# Patient Record
Sex: Female | Born: 1950 | Race: White | Hispanic: No | Marital: Married | Smoking: Former smoker
Health system: Southern US, Community
[De-identification: ages and names within clinical notes are randomized; demographics above are authoritative.]

## PROBLEM LIST (undated history)

## (undated) DIAGNOSIS — K649 Unspecified hemorrhoids: Secondary | ICD-10-CM

## (undated) DIAGNOSIS — M069 Rheumatoid arthritis, unspecified: Secondary | ICD-10-CM

## (undated) DIAGNOSIS — J1282 Pneumonia due to coronavirus disease 2019: Secondary | ICD-10-CM

## (undated) DIAGNOSIS — L409 Psoriasis, unspecified: Secondary | ICD-10-CM

## (undated) DIAGNOSIS — U071 COVID-19: Secondary | ICD-10-CM

## (undated) HISTORY — DX: Pneumonia due to coronavirus disease 2019: J12.82

## (undated) HISTORY — PX: HEMORROIDECTOMY: SUR656

## (undated) HISTORY — PX: CATARACT EXTRACTION, BILATERAL: SHX1313

## (undated) HISTORY — DX: Psoriasis, unspecified: L40.9

## (undated) HISTORY — PX: BREAST EXCISIONAL BIOPSY: SUR124

## (undated) HISTORY — PX: JOINT REPLACEMENT: SHX530

## (undated) HISTORY — PX: ABDOMINAL HYSTERECTOMY: SHX81

## (undated) HISTORY — PX: COLONOSCOPY: SHX174

## (undated) HISTORY — PX: OOPHORECTOMY: SHX86

## (undated) HISTORY — PX: APPENDECTOMY: SHX54

## (undated) HISTORY — PX: EYE SURGERY: SHX253

## (undated) HISTORY — PX: BACK SURGERY: SHX140

## (undated) HISTORY — PX: BREAST BIOPSY: SHX20

## (undated) HISTORY — DX: Rheumatoid arthritis, unspecified: M06.9

## (undated) HISTORY — DX: COVID-19: U07.1

## (undated) HISTORY — PX: BUNIONECTOMY: SHX129

---

## 1998-10-23 ENCOUNTER — Encounter: Payer: Self-pay | Admitting: General Surgery

## 1998-10-23 ENCOUNTER — Ambulatory Visit (HOSPITAL_BASED_OUTPATIENT_CLINIC_OR_DEPARTMENT_OTHER): Admission: RE | Admit: 1998-10-23 | Discharge: 1998-10-23 | Payer: Self-pay | Admitting: General Surgery

## 2000-07-22 ENCOUNTER — Encounter: Payer: Self-pay | Admitting: Orthopedic Surgery

## 2000-07-22 ENCOUNTER — Ambulatory Visit (HOSPITAL_COMMUNITY): Admission: RE | Admit: 2000-07-22 | Discharge: 2000-07-22 | Payer: Self-pay | Admitting: Orthopedic Surgery

## 2001-04-15 ENCOUNTER — Ambulatory Visit (HOSPITAL_COMMUNITY): Admission: RE | Admit: 2001-04-15 | Discharge: 2001-04-15 | Payer: Self-pay | Admitting: Orthopedic Surgery

## 2004-05-24 ENCOUNTER — Ambulatory Visit: Payer: Self-pay | Admitting: Family Medicine

## 2004-06-06 ENCOUNTER — Inpatient Hospital Stay (HOSPITAL_COMMUNITY): Admission: RE | Admit: 2004-06-06 | Discharge: 2004-06-10 | Payer: Self-pay | Admitting: Orthopaedic Surgery

## 2004-08-24 ENCOUNTER — Ambulatory Visit: Payer: Self-pay | Admitting: Family Medicine

## 2004-09-06 ENCOUNTER — Ambulatory Visit (HOSPITAL_COMMUNITY): Admission: RE | Admit: 2004-09-06 | Discharge: 2004-09-07 | Payer: Self-pay | Admitting: Orthopaedic Surgery

## 2005-04-25 ENCOUNTER — Ambulatory Visit (HOSPITAL_COMMUNITY): Admission: RE | Admit: 2005-04-25 | Discharge: 2005-04-26 | Payer: Self-pay | Admitting: Orthopedic Surgery

## 2005-08-26 ENCOUNTER — Ambulatory Visit (HOSPITAL_COMMUNITY): Admission: RE | Admit: 2005-08-26 | Discharge: 2005-08-26 | Payer: Self-pay | Admitting: Neurology

## 2005-10-31 ENCOUNTER — Emergency Department: Payer: Self-pay | Admitting: Emergency Medicine

## 2005-11-04 ENCOUNTER — Ambulatory Visit: Payer: Self-pay | Admitting: Rheumatology

## 2005-12-12 ENCOUNTER — Ambulatory Visit: Payer: Self-pay | Admitting: Family Medicine

## 2006-08-04 ENCOUNTER — Ambulatory Visit: Payer: Self-pay | Admitting: Family Medicine

## 2006-12-17 ENCOUNTER — Ambulatory Visit: Payer: Self-pay | Admitting: Family Medicine

## 2006-12-17 DIAGNOSIS — M810 Age-related osteoporosis without current pathological fracture: Secondary | ICD-10-CM | POA: Insufficient documentation

## 2006-12-17 DIAGNOSIS — M8580 Other specified disorders of bone density and structure, unspecified site: Secondary | ICD-10-CM | POA: Insufficient documentation

## 2006-12-23 ENCOUNTER — Ambulatory Visit: Payer: Self-pay | Admitting: Family Medicine

## 2007-03-04 ENCOUNTER — Ambulatory Visit: Payer: Self-pay | Admitting: Rheumatology

## 2007-03-24 ENCOUNTER — Ambulatory Visit: Payer: Self-pay | Admitting: Orthopaedic Surgery

## 2007-03-30 ENCOUNTER — Ambulatory Visit: Payer: Self-pay | Admitting: Orthopaedic Surgery

## 2007-06-29 ENCOUNTER — Ambulatory Visit: Payer: Self-pay | Admitting: Family Medicine

## 2007-07-09 HISTORY — PX: REPLACEMENT TOTAL KNEE: SUR1224

## 2007-09-17 ENCOUNTER — Ambulatory Visit: Payer: Self-pay

## 2007-10-21 ENCOUNTER — Ambulatory Visit: Payer: Self-pay | Admitting: Orthopaedic Surgery

## 2007-10-27 ENCOUNTER — Ambulatory Visit: Payer: Self-pay | Admitting: Orthopaedic Surgery

## 2007-11-11 LAB — HM PAP SMEAR: HM Pap smear: NEGATIVE

## 2008-01-04 ENCOUNTER — Ambulatory Visit: Payer: Self-pay | Admitting: Family Medicine

## 2008-01-13 ENCOUNTER — Ambulatory Visit: Payer: Self-pay | Admitting: Unknown Physician Specialty

## 2008-01-19 ENCOUNTER — Inpatient Hospital Stay: Payer: Self-pay | Admitting: Unknown Physician Specialty

## 2008-07-08 HISTORY — PX: TOTAL SHOULDER REPLACEMENT: SUR1217

## 2008-09-05 ENCOUNTER — Ambulatory Visit: Payer: Self-pay | Admitting: Unknown Physician Specialty

## 2008-09-07 ENCOUNTER — Inpatient Hospital Stay: Payer: Self-pay | Admitting: Unknown Physician Specialty

## 2008-11-15 DIAGNOSIS — M9979 Connective tissue and disc stenosis of intervertebral foramina of abdomen and other regions: Secondary | ICD-10-CM | POA: Insufficient documentation

## 2008-11-15 DIAGNOSIS — D649 Anemia, unspecified: Secondary | ICD-10-CM | POA: Insufficient documentation

## 2008-11-18 DIAGNOSIS — Z90710 Acquired absence of both cervix and uterus: Secondary | ICD-10-CM | POA: Insufficient documentation

## 2009-01-04 ENCOUNTER — Ambulatory Visit: Payer: Self-pay | Admitting: Family Medicine

## 2010-01-17 ENCOUNTER — Ambulatory Visit: Payer: Self-pay | Admitting: Family Medicine

## 2010-02-08 DIAGNOSIS — I10 Essential (primary) hypertension: Secondary | ICD-10-CM | POA: Insufficient documentation

## 2011-01-22 ENCOUNTER — Ambulatory Visit: Payer: Self-pay | Admitting: Family Medicine

## 2011-11-20 ENCOUNTER — Ambulatory Visit: Payer: Self-pay | Admitting: Physical Medicine and Rehabilitation

## 2012-01-23 ENCOUNTER — Ambulatory Visit: Payer: Self-pay | Admitting: Family Medicine

## 2012-02-24 ENCOUNTER — Ambulatory Visit: Payer: Self-pay | Admitting: Family Medicine

## 2012-03-05 ENCOUNTER — Ambulatory Visit: Payer: Self-pay | Admitting: Gastroenterology

## 2012-03-05 LAB — HM COLONOSCOPY: HM Colonoscopy: NORMAL

## 2012-11-12 ENCOUNTER — Other Ambulatory Visit: Payer: Self-pay | Admitting: Neurological Surgery

## 2012-11-12 DIAGNOSIS — M542 Cervicalgia: Secondary | ICD-10-CM

## 2012-12-07 ENCOUNTER — Ambulatory Visit
Admission: RE | Admit: 2012-12-07 | Discharge: 2012-12-07 | Disposition: A | Discharge status: Home / Self Care | Payer: Medicare Other | Source: Ambulatory Visit | Attending: Neurological Surgery | Admitting: Neurological Surgery

## 2012-12-07 DIAGNOSIS — M542 Cervicalgia: Secondary | ICD-10-CM

## 2013-08-10 ENCOUNTER — Ambulatory Visit: Payer: Self-pay | Admitting: Anesthesiology

## 2013-08-10 LAB — POTASSIUM: Potassium: 3.6 mmol/L (ref 3.5–5.1)

## 2013-08-12 ENCOUNTER — Ambulatory Visit: Payer: Self-pay | Admitting: Orthopedic Surgery

## 2013-09-05 HISTORY — PX: CARPAL TUNNEL RELEASE: SHX101

## 2014-01-10 DIAGNOSIS — M5412 Radiculopathy, cervical region: Secondary | ICD-10-CM

## 2014-01-10 DIAGNOSIS — M5030 Other cervical disc degeneration, unspecified cervical region: Secondary | ICD-10-CM | POA: Insufficient documentation

## 2014-01-10 DIAGNOSIS — M4722 Other spondylosis with radiculopathy, cervical region: Secondary | ICD-10-CM | POA: Insufficient documentation

## 2014-01-10 DIAGNOSIS — M47812 Spondylosis without myelopathy or radiculopathy, cervical region: Secondary | ICD-10-CM

## 2014-01-10 DIAGNOSIS — G5600 Carpal tunnel syndrome, unspecified upper limb: Secondary | ICD-10-CM | POA: Insufficient documentation

## 2014-01-19 LAB — CBC AND DIFFERENTIAL
HCT: 40 % (ref 36–46)
Hemoglobin: 13.1 g/dL (ref 12.0–16.0)
Neutrophils Absolute: 3 /uL
Platelets: 315 10*3/uL (ref 150–399)
WBC: 4.7 10^3/mL

## 2014-01-19 LAB — LIPID PANEL
Cholesterol: 225 mg/dL — AB (ref 0–200)
HDL: 84 mg/dL — AB (ref 35–70)
LDL Cholesterol: 128 mg/dL
Triglycerides: 63 mg/dL (ref 40–160)

## 2014-01-19 LAB — BASIC METABOLIC PANEL
BUN: 19 mg/dL (ref 4–21)
Creatinine: 0.7 mg/dL (ref 0.5–1.1)
Glucose: 98 mg/dL
POTASSIUM: 4.9 mmol/L (ref 3.4–5.3)
Sodium: 145 mmol/L (ref 137–147)

## 2014-01-19 LAB — TSH: TSH: 2.59 u[IU]/mL (ref 0.41–5.90)

## 2014-01-19 LAB — HEPATIC FUNCTION PANEL
ALT: 17 U/L (ref 7–35)
AST: 17 U/L (ref 13–35)
Alkaline Phosphatase: 63 U/L (ref 25–125)
Bilirubin, Total: 0.6 mg/dL

## 2014-02-10 ENCOUNTER — Ambulatory Visit: Payer: Self-pay | Admitting: Family Medicine

## 2014-02-10 LAB — HM MAMMOGRAPHY

## 2014-02-28 LAB — HM DEXA SCAN

## 2014-10-29 NOTE — Op Note (Signed)
PATIENT NAME:  Meredith Russell, Meredith Russell MR#:  [id number] DATE OF BIRTH:  1950/09/24  DATE OF PROCEDURE:  08/12/2013  PREOPERATIVE DIAGNOSES: 1.  Rheumatoid arthritis. 2.  Bilateral carpal tunnel syndrome.   POSTOPERATIVE DIAGNOSIS: 1.  Rheumatoid arthritis. 2.  Bilateral carpal tunnel syndrome.  PROCEDURE:  Right carpal tunnel release and flexor tenosynovectomy on the left, carpal tunnel release.   ANESTHESIA: General.   SURGEON: Hessie Knows, MD.   DESCRIPTION OF PROCEDURE: The patient was brought to the operating room and after adequate anesthesia was obtained, both arms were prepped and draped in the usual sterile fashion with a tourniquet applied to the upper arm. Appropriate patient identification and timeout procedures were completed. The right arm was approached first with raising of tourniquet. The palm was approached with an incision in line with the ring metacarpal approximately an inch long. The skin and subcutaneous tissue were spread and the transverse carpal ligament identified. This was opened and a hemostat was placed beneath this to protect the underlying structures. Release was carried out initially distally and then proximally in the proximal canal. There did appear to be significant compression on the median nerve. After release, there was vascular blush to the nerve. There was also very extensive flexor tenosynovitis of the flexor digitorum sublimis with minimal involvement of the profundus tendons. The flexor tenosynovium was then debrided with a large amount of flexor tenosynovium removed. The sublimis tendons appeared to have significant wear to them with fibrillation along the outer edges consistent with chronic inflammation. The wound was then irrigated and then infiltrated with a total of 10 mL of 0.5% Sensorcaine plain and closed with simple interrupted 4-0 nylon skin sutures. Xeroform, 4 x 4's, Webril and sterile Ace were applied and the tourniquet let down. Attention was then  turned to the left arm with a repeat timeout procedure completed, tourniquet raised and identical incision made. Skin and subcutaneous tissue was opened and transverse carpal ligament incised. Hemostat was placed and release was carried out proximally and distally. There again, was compression in the mid to proximal canal and after release there was good vascular blush to the nerve. There was really not significant flexor tenosynovitis present on the left wrist.  Because of this, the wound was irrigated and then closed without any debridement required. The wound was then infiltrated with 10 mL of 0.5% Sensorcaine and then closed with simple interrupted nylon skin sutures. Xeroform, 4 x 4's, Webril and Ace wrap applied. Tourniquet time on the left was 9 minutes, on the right 12 minutes. There were no complications and no specimens.   ____________________________ Laurene Footman, MD mjm:ce D: 08/12/2013 18:27:37 ET T: 08/12/2013 20:19:38 ET JOB#: [id number]  cc: Laurene Footman, MD, <Dictator> Laurene Footman MD ELECTRONICALLY SIGNED 08/13/2013 8:21

## 2014-11-22 DIAGNOSIS — E039 Hypothyroidism, unspecified: Secondary | ICD-10-CM | POA: Insufficient documentation

## 2014-11-22 DIAGNOSIS — G4700 Insomnia, unspecified: Secondary | ICD-10-CM | POA: Insufficient documentation

## 2014-11-22 DIAGNOSIS — F4320 Adjustment disorder, unspecified: Secondary | ICD-10-CM | POA: Insufficient documentation

## 2014-11-22 DIAGNOSIS — E038 Other specified hypothyroidism: Secondary | ICD-10-CM | POA: Insufficient documentation

## 2015-01-23 ENCOUNTER — Encounter: Payer: Self-pay | Admitting: Family Medicine

## 2015-01-23 ENCOUNTER — Ambulatory Visit (INDEPENDENT_AMBULATORY_CARE_PROVIDER_SITE_OTHER): Payer: Medicare Other | Admitting: Family Medicine

## 2015-01-23 VITALS — BP 124/80 | HR 60 | Temp 97.9°F | Resp 16 | Ht 64.0 in | Wt 138.0 lb

## 2015-01-23 DIAGNOSIS — M8580 Other specified disorders of bone density and structure, unspecified site: Secondary | ICD-10-CM | POA: Diagnosis not present

## 2015-01-23 DIAGNOSIS — G43009 Migraine without aura, not intractable, without status migrainosus: Secondary | ICD-10-CM | POA: Diagnosis not present

## 2015-01-23 DIAGNOSIS — G4700 Insomnia, unspecified: Secondary | ICD-10-CM

## 2015-01-23 DIAGNOSIS — G5600 Carpal tunnel syndrome, unspecified upper limb: Secondary | ICD-10-CM | POA: Diagnosis not present

## 2015-01-23 DIAGNOSIS — E039 Hypothyroidism, unspecified: Secondary | ICD-10-CM

## 2015-01-23 DIAGNOSIS — M5412 Radiculopathy, cervical region: Secondary | ICD-10-CM | POA: Diagnosis not present

## 2015-01-23 DIAGNOSIS — Z0000 Encounter for general adult medical examination without abnormal findings: Secondary | ICD-10-CM

## 2015-01-23 DIAGNOSIS — M47812 Spondylosis without myelopathy or radiculopathy, cervical region: Secondary | ICD-10-CM

## 2015-01-23 DIAGNOSIS — Z23 Encounter for immunization: Secondary | ICD-10-CM | POA: Diagnosis not present

## 2015-01-23 DIAGNOSIS — K649 Unspecified hemorrhoids: Secondary | ICD-10-CM

## 2015-01-23 DIAGNOSIS — I10 Essential (primary) hypertension: Secondary | ICD-10-CM

## 2015-01-23 DIAGNOSIS — M9979 Connective tissue and disc stenosis of intervertebral foramina of abdomen and other regions: Secondary | ICD-10-CM

## 2015-01-23 DIAGNOSIS — Z1239 Encounter for other screening for malignant neoplasm of breast: Secondary | ICD-10-CM

## 2015-01-23 DIAGNOSIS — T7840XA Allergy, unspecified, initial encounter: Secondary | ICD-10-CM | POA: Insufficient documentation

## 2015-01-23 DIAGNOSIS — M069 Rheumatoid arthritis, unspecified: Secondary | ICD-10-CM | POA: Diagnosis not present

## 2015-01-23 DIAGNOSIS — E038 Other specified hypothyroidism: Secondary | ICD-10-CM | POA: Diagnosis not present

## 2015-01-23 NOTE — Progress Notes (Signed)
Patient: Meredith Russell, Female    DOB: 05/15/1951, 64 y.o.   MRN: [id number] Visit Date: 01/23/2015  Today's Provider: Margarita Rana, MD   Chief Complaint  Patient presents with  . Medicare Wellness   Subjective:    Annual wellness visit Meredith Russell is a 64 y.o. female who presents today for her Subsequent Annual Wellness Visit. She feels well. She reports exercising daily, walks a mile, stationary bike, swims. She reports she is sleeping fairly well. Controlled on Ambien.  Last CPE- 01/19/2014 Last Pap- S/P Hysterectomy Last Mammo- 02/10/2014 BI-RADS 1 Last colon- 03/05/2012. Dr. Candace Cruise. WNL. Repeat 10 years Last BMD- 02/08/2014- Osteopenia. No change from 2010.  -----------------------------------------------------------  Joint/Muscle Pain:  Pt has degenerative arthritis. Patient complains of arthralgias and myalgias for which has been present for several years. Pain is located in multiple joints, is described as aching, and is constant .  Associated symptoms include: crepitation and edema.  The patient has multiple prescription medications, prescribed by Dr. Precious Reel, as well as Dr. Sharlet Salina.  Related to injury:   No.  Hemorrhoids: Pt would like provider to examine this problem, to see if pt needs a referral for surgery.    Review of Systems  Constitutional: Positive for chills and diaphoresis.  HENT: Positive for rhinorrhea and sinus pressure.   Eyes: Positive for itching.  Endocrine: Positive for polyuria (nocturia).  Genitourinary: Positive for frequency, genital sores (hemorrhoid) and vaginal pain.  Musculoskeletal: Positive for myalgias, back pain, arthralgias and neck pain.  Neurological: Positive for headaches.  All other systems reviewed and are negative.   History   Social History  . Marital Status: Married    Spouse Name: N/A  . Number of Children: N/A  . Years of Education: N/A   Occupational History  . Not on file.   Social History Main  Topics  . Smoking status: Former Smoker    Quit date: 11/22/1987  . Smokeless tobacco: Never Used  . Alcohol Use: Yes     Comment: occasional; drinks vodka and whiskey  . Drug Use: No  . Sexual Activity: Not on file   Other Topics Concern  . Not on file   Social History Narrative    Patient Active Problem List   Diagnosis Date Noted  . Allergic state 01/23/2015  . Adaptation reaction 11/22/2014  . Cannot sleep 11/22/2014  . Subclinical hypothyroidism 11/22/2014  . Cervical radiculitis 01/10/2014  . Cervical osteoarthritis 01/10/2014  . Carpal tunnel syndrome 01/10/2014  . BP (high blood pressure) 02/08/2010  . Chronic infection of sinus 06/26/2009  . Allergic rhinitis 11/21/2008  . H/O total hysterectomy 11/18/2008  . History of tobacco use 11/18/2008  . Absolute anemia 11/15/2008  . Narrowing of intervertebral disc space 11/15/2008  . Migraine without aura and responsive to treatment 11/15/2008  . Arthritis, degenerative 11/15/2008  . Arthritis or polyarthritis, rheumatoid 11/15/2008  . Osteopenia 12/17/2006    Past Surgical History  Procedure Laterality Date  . Joint replacement    . Back surgery    . Carpal tunnel release Bilateral 09/2013  . Abdominal hysterectomy    . Eye surgery    . Bunionectomy    . Hemorroidectomy      Her family history includes Cancer in her father; Diabetes in her other; Heart attack in her mother; Heart disease in her father, mother, and other; Hyperlipidemia in her other; Hypertension in her other.    Previous Medications   AMLODIPINE BESYLATE PO  Take by mouth.   ASPIRIN 81 MG TABLET    LOW-DOSE ASPIRIN, 81MG  (Oral Tablet)  1 Every Day for 0 days  Quantity: 0.00;  Refills: 0   Ordered :20-Sep-2010  Ashley Royalty ;  Started 21-Nov-2008 Active Comments: DX: 627.2   ETODOLAC (LODINE) 400 MG TABLET    Take by mouth.   FLUTICASONE (FLONASE) 50 MCG/ACT NASAL SPRAY    Place into the nose.   LEFLUNOMIDE (ARAVA) 20 MG TABLET    Take by  mouth.   MECLIZINE (ANTIVERT) 12.5 MG TABLET    Take by mouth.   MONTELUKAST (SINGULAIR) 10 MG TABLET    Take by mouth.   MULTIPLE VITAMIN PO    MULTIVITAMINS (Oral Tablet)  1 Every Day for 0 days  Quantity: 0.00;  Refills: 0   Ordered :20-Sep-2010  Ashley Royalty ;  Started 15-Sep-2009 Active   OMEGA-3 FATTY ACIDS PO    OMEGA-3 FATTY ACIDS, 1200MG  (Oral Capsule)  for 0 days  Quantity: 0.00;  Refills: 0   Ordered :20-Sep-2010  Ashley Royalty ;  Started 21-Nov-2008 Active Comments: DX: 714.0   ONDANSETRON (ZOFRAN) 4 MG TABLET    Take by mouth.   OXYCODONE-ACETAMINOPHEN (PERCOCET) 7.5-325 MG PER TABLET    Take by mouth.   PROPRANOLOL-HCTZ PO    Take by mouth.   SUVOREXANT 10 MG TABS    Take by mouth.   TIZANIDINE HCL PO    Take by mouth.   VITAMIN C (ASCORBIC ACID) 500 MG TABLET    VITAMIN C, 500MG  (Oral Tablet)  1 Every Day for 0 days  Quantity: 0.00;  Refills: 0   Ordered :28-Jul-2014  Abran Richard ;  Started 15-Nov-2008 Active Comments: DX: 627.2   ZOLPIDEM (AMBIEN) 10 MG TABLET    Take by mouth.    Patient Care Team: Margarita Rana, MD as PCP - General (Family Medicine)     Objective:   Vitals: BP 124/80 mmHg  Pulse 60  Temp(Src) 97.9 F (36.6 C) (Oral)  Resp 16  Ht 5\' 4"  (1.626 m)  Wt 138 lb (62.596 kg)  BMI 23.68 kg/m2  Physical Exam  Constitutional: She is oriented to person, place, and time. She appears well-developed and well-nourished. No distress.  HENT:  Head: Normocephalic and atraumatic.  Right Ear: External ear normal.  Left Ear: External ear normal.  Nose: Nose normal.  Mouth/Throat: Oropharynx is clear and moist. No oropharyngeal exudate.  Eyes: Conjunctivae and EOM are normal. Pupils are equal, round, and reactive to light. Right eye exhibits no discharge. Left eye exhibits no discharge.  Neck: Normal range of motion. Neck supple. No tracheal deviation present. No thyromegaly present.  Cardiovascular: Normal rate, regular rhythm, normal heart sounds and intact distal  pulses.  Exam reveals no gallop and no friction rub.   No murmur heard. Pulmonary/Chest: Effort normal and breath sounds normal. No respiratory distress. She has no wheezes. She has no rales. She exhibits no tenderness.  Abdominal: Soft. Bowel sounds are normal. She exhibits no distension and no mass. There is no tenderness. There is no rebound and no guarding.  Genitourinary: No breast swelling, tenderness, discharge or bleeding.  Musculoskeletal: Normal range of motion. She exhibits no edema or tenderness.  Lymphadenopathy:    She has no cervical adenopathy.  Neurological: She is alert and oriented to person, place, and time. She has normal reflexes. She displays normal reflexes. No cranial nerve deficit. She exhibits normal muscle tone. Coordination normal.  Skin: Skin is warm and dry. No  rash noted. She is not diaphoretic. No erythema. No pallor.  Psychiatric: She has a normal mood and affect. Her behavior is normal. Judgment and thought content normal.    Activities of Daily Living In your present state of health, do you have any difficulty performing the following activities: 01/23/2015  Hearing? N  Vision? N  Difficulty concentrating or making decisions? N  Walking or climbing stairs? N  Dressing or bathing? N  Doing errands, shopping? N    Fall Risk Assessment Fall Risk  01/23/2015  Falls in the past year? No     Depression Screen PHQ 2/9 Scores 01/23/2015  PHQ - 2 Score 0    Cognitive Testing - 6-CIT  Correct? Score   What year is it? yes 0 0 or 4  What month is it? yes 0 0 or 3  Memorize:    Pia Mau,  40,  [address]  [place],      What time is it? (within 1 hour) yes 0 0 or 3  Count backwards from 20 yes 0 0, 2, or 4  Name the months of the year yes 0 0, 2, or 4  Repeat name & address above yes 0 0, 2, 4, 6, 8, or 10       TOTAL SCORE  0/28   Interpretation:  Normal  Normal (0-7) Abnormal (8-28)       Assessment & Plan:     Annual Wellness  Visit  Reviewed patient's Family Medical History Reviewed and updated list of patient's medical providers Assessment of cognitive impairment was done Assessed patient's functional ability Established a written schedule for health screening [place] Completed and Reviewed  Exercise Activities and Dietary recommendations Goals    None     PREVNAR ADMINISTERED AT TODAY"S OV.  Immunization History  Administered Date(s) Administered  . Pneumococcal Conjugate-13 01/23/2015  . Tdap 10/07/2006  . Zoster 07/06/2012    Health Maintenance  Topic Date Due  . HIV Screening  04/10/1964  . PNA vac Low Risk Adult (1 of 2 - PCV13) 04/10/2014  . INFLUENZA VACCINE  02/06/2015  . MAMMOGRAM  02/11/2016  . TETANUS/TDAP  10/06/2016  . COLONOSCOPY  03/05/2022  . DEXA SCAN  Completed  . ZOSTAVAX  Completed      Discussed health benefits of physical activity, and encouraged her to engage in regular exercise appropriate for her age and condition.    ------------------------------------------------------------------------------------------------------------   1. Hemorrhoids, unspecified hemorrhoid type New problem. Will refer to general surgeon.  - Ambulatory refer to surgery.   2. Essential hypertension Stable. Continue current medications and plan of care.  3. Migraine without aura and responsive to treatment F/B physiatry. Also for her chronic pain.   4. Subclinical hypothyroidism Stable. Last labs stable.   5. Carpal tunnel syndrome, unspecified laterality Stable.  6. Cervical radiculitis F/B specialist. 7. Arthritis or polyarthritis, rheumatoid F/B rheumatology.   8. Cervical osteoarthritis Stable. Will recheck BMD in 2-3 years.  9. Narrowing of intervertebral disc space F/B specialist.  10. Osteopenia Stable. Will recheck BMD in 2-3 years.  11. Cannot sleep Stable. Will refill Ambien as needed.  Understands risks of medication. Does perform good  sleep hygiene. Has tried to taper and other medication without difficulty.    12. Breast cancer screening Pt aware to call to schedule mammogram. - MM DIGITAL SCREENING BILATERAL; Future   Patient seen and examined by Jerrell Belfast, MD, and note scribed by Renaldo Fiddler, CMA. I have  reviewed the document for accuracy and completeness and I agree with above. Jerrell Belfast, MD    Margarita Rana, MD

## 2015-02-03 NOTE — Addendum Note (Signed)
Addended by: Silvio Clayman on: 02/03/2015 05:01 PM   Modules accepted: Orders

## 2015-02-07 ENCOUNTER — Other Ambulatory Visit: Payer: Self-pay | Admitting: Family Medicine

## 2015-02-07 DIAGNOSIS — I10 Essential (primary) hypertension: Secondary | ICD-10-CM

## 2015-02-07 DIAGNOSIS — J309 Allergic rhinitis, unspecified: Secondary | ICD-10-CM

## 2015-02-13 ENCOUNTER — Other Ambulatory Visit: Payer: Self-pay | Admitting: Family Medicine

## 2015-02-13 ENCOUNTER — Ambulatory Visit
Admission: RE | Admit: 2015-02-13 | Discharge: 2015-02-13 | Disposition: A | Discharge status: Home / Self Care | Payer: Medicare Other | Source: Ambulatory Visit | Attending: Family Medicine | Admitting: Family Medicine

## 2015-02-13 DIAGNOSIS — Z1239 Encounter for other screening for malignant neoplasm of breast: Secondary | ICD-10-CM

## 2015-02-13 DIAGNOSIS — Z1231 Encounter for screening mammogram for malignant neoplasm of breast: Secondary | ICD-10-CM | POA: Insufficient documentation

## 2015-06-22 ENCOUNTER — Other Ambulatory Visit: Payer: Self-pay | Admitting: Physical Medicine and Rehabilitation

## 2015-06-22 DIAGNOSIS — M5412 Radiculopathy, cervical region: Secondary | ICD-10-CM

## 2015-07-12 ENCOUNTER — Ambulatory Visit
Admission: RE | Admit: 2015-07-12 | Discharge: 2015-07-12 | Disposition: A | Discharge status: Home / Self Care | Payer: Medicare Other | Source: Ambulatory Visit | Attending: Physical Medicine and Rehabilitation | Admitting: Physical Medicine and Rehabilitation

## 2015-07-12 DIAGNOSIS — M5412 Radiculopathy, cervical region: Secondary | ICD-10-CM

## 2015-07-12 DIAGNOSIS — R200 Anesthesia of skin: Secondary | ICD-10-CM | POA: Insufficient documentation

## 2015-07-12 DIAGNOSIS — M4802 Spinal stenosis, cervical region: Secondary | ICD-10-CM | POA: Insufficient documentation

## 2015-07-12 DIAGNOSIS — R51 Headache: Secondary | ICD-10-CM | POA: Insufficient documentation

## 2016-01-24 ENCOUNTER — Ambulatory Visit (INDEPENDENT_AMBULATORY_CARE_PROVIDER_SITE_OTHER): Payer: Medicare Other | Admitting: Family Medicine

## 2016-01-24 ENCOUNTER — Encounter: Payer: Medicare Other | Admitting: Family Medicine

## 2016-01-24 DIAGNOSIS — Z0000 Encounter for general adult medical examination without abnormal findings: Secondary | ICD-10-CM

## 2016-01-24 NOTE — Progress Notes (Signed)
   PATIENT NOT SEEN DUE TO EVACUATION OF [facility]   Review of Systems  Constitutional: Negative for fever, chills and fatigue.  HENT: Positive for ear pain and sinus pressure. Negative for congestion, rhinorrhea, sneezing and sore throat.   Eyes: Positive for photophobia and itching. Negative for pain and redness.  Respiratory: Negative for cough, shortness of breath and wheezing.   Cardiovascular: Negative for chest pain and leg swelling.  Gastrointestinal: Positive for constipation. Negative for nausea, abdominal pain, diarrhea and blood in stool.  Endocrine: Positive for cold intolerance. Negative for polydipsia and polyphagia.  Genitourinary: Negative.  Negative for dysuria, hematuria, flank pain, vaginal bleeding, vaginal discharge and pelvic pain.  Musculoskeletal: Positive for myalgias, back pain, arthralgias and neck pain. Negative for joint swelling and gait problem.  Skin: Negative for rash.  Neurological: Positive for dizziness. Negative for tremors, seizures, weakness, light-headedness, numbness and headaches.  Hematological: Negative for adenopathy.  Psychiatric/Behavioral: Negative.  Negative for behavioral problems, confusion and dysphoric mood. The patient is not nervous/anxious and is not hyperactive.       Physical Exam

## 2016-02-14 ENCOUNTER — Other Ambulatory Visit: Payer: Self-pay | Admitting: Family Medicine

## 2016-02-14 DIAGNOSIS — Z1231 Encounter for screening mammogram for malignant neoplasm of breast: Secondary | ICD-10-CM

## 2016-02-15 ENCOUNTER — Encounter: Payer: Self-pay | Admitting: Family Medicine

## 2016-02-21 ENCOUNTER — Encounter: Payer: Self-pay | Admitting: Family Medicine

## 2016-02-22 ENCOUNTER — Ambulatory Visit (INDEPENDENT_AMBULATORY_CARE_PROVIDER_SITE_OTHER): Payer: Medicare Other | Admitting: Family Medicine

## 2016-02-22 ENCOUNTER — Encounter: Payer: Self-pay | Admitting: Family Medicine

## 2016-02-22 VITALS — BP 148/84 | HR 72 | Temp 97.9°F | Resp 16 | Ht 63.0 in | Wt 137.0 lb

## 2016-02-22 DIAGNOSIS — R42 Dizziness and giddiness: Secondary | ICD-10-CM

## 2016-02-22 DIAGNOSIS — Z0000 Encounter for general adult medical examination without abnormal findings: Secondary | ICD-10-CM

## 2016-02-22 DIAGNOSIS — E039 Hypothyroidism, unspecified: Secondary | ICD-10-CM

## 2016-02-22 DIAGNOSIS — H9312 Tinnitus, left ear: Secondary | ICD-10-CM

## 2016-02-22 DIAGNOSIS — Z1231 Encounter for screening mammogram for malignant neoplasm of breast: Secondary | ICD-10-CM

## 2016-02-22 DIAGNOSIS — E038 Other specified hypothyroidism: Secondary | ICD-10-CM | POA: Diagnosis not present

## 2016-02-22 DIAGNOSIS — Z1239 Encounter for other screening for malignant neoplasm of breast: Secondary | ICD-10-CM

## 2016-02-22 DIAGNOSIS — Z23 Encounter for immunization: Secondary | ICD-10-CM

## 2016-02-22 NOTE — Progress Notes (Signed)
Patient: Meredith Russell, Female    DOB: 1951/05/21, 65 y.o.   MRN: [id number] Visit Date: 02/22/2016  Today's Provider: Lelon Huh, MD   Chief Complaint  Patient presents with  . Annual Exam   Subjective:    Annual physical Meredith Russell is a 65 y.o. female. She feels well. She reports exercising daily. She reports she is sleeping poorly. 01/23/15 AWE 02/13/15 Mammogram-BI-RADS 1 02/28/14 BMD 03/05/12 Colonoscopy-normal -----------------------------------------------------------   Hypertension, follow-up:  BP Readings from Last 3 Encounters:  02/22/16 (!) 148/84  01/23/15 124/80  07/28/14 118/74    She was last seen for hypertension 1 years ago.  BP at that visit was 124/80. Management changes since that visit include no changes. She reports excellent compliance with treatment. She is not having side effects. She is exercising. She is adherent to low salt diet.   Outside blood pressures are stable. She is experiencing none.  Patient denies chest pain.   Cardiovascular risk factors include hypertension and smoking/ tobacco exposure.  Use of agents associated with hypertension: decongestants.     Weight trend: stable Wt Readings from Last 3 Encounters:  02/22/16 137 lb (62.1 kg)  01/23/15 138 lb (62.6 kg)  07/28/14 140 lb (63.5 kg)    Current diet: in general, a "healthy" diet    ------------------------------------------------------------------------  Hypothyroid, follow-up:  TSH  Date Value Ref Range Status  01/19/2014 2.59 0.41 - 5.90 uIU/mL Final   Wt Readings from Last 3 Encounters:  02/22/16 137 lb (62.1 kg)  01/23/15 138 lb (62.6 kg)  07/28/14 140 lb (63.5 kg)    She was last seen for hypothyroid 1 years ago.  Management since that visit includes no changes. She reports excellent compliance with treatment. She is having side effects.  She is exercising. She is experiencing heat / cold intolerance She denies none Weight trend:  stable  ------------------------------------------------------------------------   Follow up for Insomnia  The patient was last seen for this 1 years ago. Changes made at last visit include no changes.  She reports excellent compliance with treatment. She feels that condition is Unchanged. She is not having side effects.  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   She is states she has been hearing 'cricket' noises in her left ear for a few months, associated with some light headedness. She occasionally take Claritin D for allergies.   Review of Systems  Constitutional: Negative.   HENT: Positive for ear pain, sinus pressure and tinnitus (left more than right).   Eyes: Positive for photophobia and itching.  Respiratory: Negative.   Cardiovascular: Negative.   Gastrointestinal: Positive for constipation.  Endocrine: Positive for cold intolerance.  Genitourinary: Negative.   Musculoskeletal: Positive for arthralgias, back pain, myalgias and neck pain.  Skin: Negative.   Allergic/Immunologic: Negative.   Neurological: Positive for dizziness.  Hematological: Negative.   Psychiatric/Behavioral: Negative.     Social History   Social History  . Marital status: Married    Spouse name: N/A  . Number of children: N/A  . Years of education: N/A   Occupational History  . Not on file.   Social History Main Topics  . Smoking status: Former Smoker    Quit date: 11/22/1987  . Smokeless tobacco: Never Used  . Alcohol use Yes     Comment: occasional; drinks vodka and whiskey  . Drug use: No  . Sexual activity: Not on file   Other Topics Concern  . Not  on file   Social History Narrative  . No narrative on file    History reviewed. No pertinent past medical history.   Patient Active Problem List   Diagnosis Date Noted  . Allergic state 01/23/2015  . Adaptation  reaction 11/22/2014  . Cannot sleep 11/22/2014  . Subclinical hypothyroidism 11/22/2014  . Cervical radiculitis 01/10/2014  . Cervical osteoarthritis 01/10/2014  . Carpal tunnel syndrome 01/10/2014  . DDD (degenerative disc disease), cervical 01/10/2014  . BP (high blood pressure) 02/08/2010  . Chronic infection of sinus 06/26/2009  . Allergic rhinitis 11/21/2008  . H/O total hysterectomy 11/18/2008  . History of tobacco use 11/18/2008  . Absolute anemia 11/15/2008  . Narrowing of intervertebral disc space 11/15/2008  . Migraine without aura and responsive to treatment 11/15/2008  . Arthritis, degenerative 11/15/2008  . Arthritis or polyarthritis, rheumatoid ([place]) 11/15/2008  . Osteopenia 12/17/2006    Past Surgical History:  Procedure Laterality Date  . ABDOMINAL HYSTERECTOMY    . BACK SURGERY    . BREAST BIOPSY Right    neg  . BUNIONECTOMY    . CARPAL TUNNEL RELEASE Bilateral 09/2013  . EYE SURGERY    . HEMORROIDECTOMY    . JOINT REPLACEMENT      Her family history includes Cancer in her father; Diabetes in her other; Heart attack in her mother; Heart disease in her father, mother, and other; Hyperlipidemia in her other; Hypertension in her other.    Current Meds  Medication Sig  . amLODipine (NORVASC) 2.5 MG tablet Take 1 tablet by mouth daily.  Marland Kitchen aspirin 81 MG tablet LOW-DOSE ASPIRIN, 81MG  (Oral Tablet)  1 Every Day for 0 days  Quantity: 0.00;  Refills: 0   Ordered :20-Sep-2010  Ashley Royalty ;  Started 21-Nov-2008 Active Comments: DX: 627.2  . conjugated estrogens (PREMARIN) vaginal cream Place vaginally. Place 0.5 g vaginally once daily. Daily for two weeks, every other day for two weeks, then twice a week thereafter.  . diclofenac (VOLTAREN) 50 MG EC tablet Take 1 tablet by mouth 2 (two) times daily.  . fluticasone (FLONASE) 50 MCG/ACT nasal spray Place into the nose.  Marland Kitchen HUMIRA PEN 40 MG/0.8ML PNKT Inject 0.8 mLs as directed every 14 (fourteen) days.  Marland Kitchen leflunomide  (ARAVA) 20 MG tablet Take 20 mg by mouth daily.   . Loratadine-Pseudoephedrine (CLARITIN-D 24 HOUR PO) Take 1 tablet by mouth daily as needed.  . meclizine (ANTIVERT) 12.5 MG tablet Take 12.5 mg by mouth as needed.   . OMEGA-3 FATTY ACIDS PO OMEGA-3 FATTY ACIDS, 1200MG  (Oral Capsule)  for 0 days  Quantity: 0.00;  Refills: 0   Ordered :20-Sep-2010  Ashley Royalty ;  Started 21-Nov-2008 Active Comments: DX: 714.0  . ondansetron (ZOFRAN) 4 MG tablet Take 4 mg by mouth every 8 (eight) hours as needed.   Marland Kitchen oxyCODONE-acetaminophen (PERCOCET) 7.5-325 MG per tablet Take 1 tablet by mouth every 8 (eight) hours as needed.   Marland Kitchen tiZANidine (ZANAFLEX) 2 MG tablet Take 2 mg by mouth every 6 (six) hours as needed for muscle spasms.  . vitamin C (ASCORBIC ACID) 500 MG tablet VITAMIN C, 500MG  (Oral Tablet)  1 Every Day for 0 days  Quantity: 0.00;  Refills: 0   Ordered :28-Jul-2014  Abran Richard ;  Started 15-Nov-2008 Active Comments: DX: 627.2  . zolpidem (AMBIEN) 10 MG tablet Take 10 mg by mouth at bedtime.   . [DISCONTINUED] etodolac (LODINE) 400 MG tablet Take 1 tablet by mouth 2 (two) times daily.  . [  DISCONTINUED] montelukast (SINGULAIR) 10 MG tablet Take 1 tablet by mouth  every day  . [DISCONTINUED] MULTIPLE VITAMIN PO MULTIVITAMINS (Oral Tablet)  1 Every Day for 0 days  Quantity: 0.00;  Refills: 0   Ordered :20-Sep-2010  Ashley Royalty ;  Started 15-Sep-2009 Active  . [DISCONTINUED] PROPRANOLOL-HCTZ PO Take by mouth.  . [DISCONTINUED] propranolol-hydrochlorothiazide (INDERIDE) 80-25 MG per tablet Take 1 tablet by mouth  daily  . [DISCONTINUED] Suvorexant 10 MG TABS Take by mouth.  . [DISCONTINUED] TIZANIDINE HCL PO Take by mouth.  . [DISCONTINUED] tolterodine (DETROL LA) 2 MG 24 hr capsule Take 1 capsule by mouth daily.    Patient Care Team: Birdie Sons, MD as PCP - General (Family Medicine) Julieanne Manson Leeanne Mannan., MD (Rheumatology)    Objective:   Vitals: BP (!) 148/84 (BP Location: Right Arm,  Patient Position: Sitting, Cuff Size: Normal)   Pulse 72   Temp 97.9 F (36.6 C) (Oral)   Resp 16   Ht 5\' 3"  (1.6 m)   Wt 137 lb (62.1 kg)   SpO2 98%   BMI 24.27 kg/m   Physical Exam  Constitutional: She is oriented to person, place, and time. She appears well-developed and well-nourished.  HENT:  Head: Normocephalic and atraumatic.  Right Ear: External ear normal.  Left Ear: External ear normal.  Nose: Nose normal.  Mouth/Throat: Oropharynx is clear and moist.  Eyes: Conjunctivae and EOM are normal.  Neck: Normal range of motion. Neck supple.  Cardiovascular: Normal rate, regular rhythm and normal heart sounds.   Pulmonary/Chest: Effort normal and breath sounds normal.  Abdominal: Soft. Bowel sounds are normal.  Musculoskeletal: Normal range of motion.  Neurological: She is alert and oriented to person, place, and time.  Skin: Skin is warm and dry.  Psychiatric: She has a normal mood and affect. Her behavior is normal. Judgment and thought content normal.    Activities of Daily Living In your present state of health, do you have any difficulty performing the following activities: 02/22/2016 01/24/2016  Hearing? N N  Vision? N N  Difficulty concentrating or making decisions? N N  Walking or climbing stairs? N N  Dressing or bathing? N N  Doing errands, shopping? N N  Some recent data might be hidden    Fall Risk Assessment Fall Risk  02/22/2016 01/24/2016 01/23/2015  Falls in the past year? No No No     Depression Screen PHQ 2/9 Scores 02/22/2016 01/24/2016 01/23/2015  PHQ - 2 Score 0 0 0    Cognitive Testing - 6-CIT  Correct? Score   What year is it? yes 0 0 or 4  What month is it? yes 0 0 or 3  Memorize:    Pia Mau,  40,  [place],      What time is it? (within 1 hour) yes 0 0 or 3  Count backwards from 20 yes 0 0, 2, or 4  Name the months of the year yes 0 0, 2, or 4  Repeat name & address above yes 0 0, 2, 4, 6, 8, or 10       TOTAL SCORE  0/28    Interpretation:  Normal  Normal (0-7) Abnormal (8-28)       Assessment & Plan:     Annual physical  Reviewed patient's Family Medical History Reviewed and updated list of patient's medical providers Assessment of cognitive impairment was done Assessed patient's functional ability Established a written schedule for health screening services  Health Risk Assessent Completed and Reviewed  Exercise Activities and Dietary recommendations Goals    None      Immunization History  Administered Date(s) Administered  . Pneumococcal Conjugate-13 01/23/2015  . Pneumococcal Polysaccharide-23 02/22/2016  . Tdap 10/07/2006  . Zoster 07/06/2012    Health Maintenance  Topic Date Due  . PNA vac Low Risk Adult (2 of 2 - PPSV23) 01/23/2016  . INFLUENZA VACCINE  02/06/2016  . TETANUS/TDAP  10/06/2016  . MAMMOGRAM  02/12/2017  . COLONOSCOPY  03/05/2022  . DEXA SCAN  Completed  . ZOSTAVAX  Completed  . Hepatitis C Screening  Completed      Discussed health benefits of physical activity, and encouraged her to engage in regular exercise appropriate for her age and condition.    ------------------------------------------------------------------------------------------------------------  1. Annual physical exam  - EKG 12-Lead  2. Breast cancer screening Patient to call [facility] to schedule mammogram - MM DIGITAL SCREENING BILATERAL  3. Need for pneumococcal vaccination  - Pneumococcal polysaccharide vaccine 23-valent greater than or equal to 2yo subcutaneous/IM  4. Subclinical hypothyroidism  - Lipid Panel With LDL/HDL Ratio - TSH  5. Dizziness  - EKG 12-Lead - CBC with Differential/Platelet - Comprehensive metabolic panel - Ambulatory referral to Audiology  6. Tinnitus, left  - Ambulatory referral to Audiology   Lelon Huh, MD  [facility]

## 2016-02-23 ENCOUNTER — Telehealth: Payer: Self-pay

## 2016-02-23 LAB — CBC WITH DIFFERENTIAL/PLATELET
BASOS ABS: 0 10*3/uL (ref 0.0–0.2)
Basos: 1 %
EOS (ABSOLUTE): 0.4 10*3/uL (ref 0.0–0.4)
Eos: 7 %
Hematocrit: 38.4 % (ref 34.0–46.6)
Hemoglobin: 12.8 g/dL (ref 11.1–15.9)
Immature Grans (Abs): 0 10*3/uL (ref 0.0–0.1)
Immature Granulocytes: 0 %
LYMPHS ABS: 1.1 10*3/uL (ref 0.7–3.1)
Lymphs: 23 %
MCH: 31.4 pg (ref 26.6–33.0)
MCHC: 33.3 g/dL (ref 31.5–35.7)
MCV: 94 fL (ref 79–97)
MONOS ABS: 0.5 10*3/uL (ref 0.1–0.9)
Monocytes: 11 %
NEUTROS ABS: 2.8 10*3/uL (ref 1.4–7.0)
Neutrophils: 58 %
PLATELETS: 356 10*3/uL (ref 150–379)
RBC: 4.07 x10E6/uL (ref 3.77–5.28)
RDW: 12.9 % (ref 12.3–15.4)
WBC: 4.8 10*3/uL (ref 3.4–10.8)

## 2016-02-23 LAB — COMPREHENSIVE METABOLIC PANEL
ALBUMIN: 4.4 g/dL (ref 3.6–4.8)
ALT: 24 IU/L (ref 0–32)
AST: 22 IU/L (ref 0–40)
Albumin/Globulin Ratio: 1.9 (ref 1.2–2.2)
Alkaline Phosphatase: 79 IU/L (ref 39–117)
BUN / CREAT RATIO: 22 (ref 12–28)
BUN: 19 mg/dL (ref 8–27)
Bilirubin Total: 0.4 mg/dL (ref 0.0–1.2)
CHLORIDE: 101 mmol/L (ref 96–106)
CO2: 25 mmol/L (ref 18–29)
Calcium: 9.7 mg/dL (ref 8.7–10.3)
Creatinine, Ser: 0.86 mg/dL (ref 0.57–1.00)
GFR calc Af Amer: 81 mL/min/{1.73_m2} (ref >59)
GFR calc non Af Amer: 71 mL/min/{1.73_m2} (ref >59)
GLUCOSE: 96 mg/dL (ref 65–99)
Globulin, Total: 2.3 g/dL (ref 1.5–4.5)
Potassium: 5.1 mmol/L (ref 3.5–5.2)
Sodium: 141 mmol/L (ref 134–144)
Total Protein: 6.7 g/dL (ref 6.0–8.5)

## 2016-02-23 LAB — LIPID PANEL WITH LDL/HDL RATIO
CHOLESTEROL TOTAL: 194 mg/dL (ref 100–199)
HDL: 80 mg/dL (ref >39)
LDL Calculated: 96 mg/dL (ref 0–99)
LDl/HDL Ratio: 1.2 ratio units (ref 0.0–3.2)
Triglycerides: 91 mg/dL (ref 0–149)
VLDL CHOLESTEROL CAL: 18 mg/dL (ref 5–40)

## 2016-02-23 LAB — TSH: TSH: 1.27 u[IU]/mL (ref 0.450–4.500)

## 2016-02-23 NOTE — Telephone Encounter (Signed)
Pt advised.   Thanks,   -Laura  

## 2016-02-23 NOTE — Telephone Encounter (Signed)
-----   Message from Birdie Sons, MD sent at 02/23/2016  8:03 AM EDT ----- thyroid, blood sugar, kidney and liver functions, electrolytes and cholesterol are all normal. Check labs yearly.

## 2016-02-27 ENCOUNTER — Telehealth: Payer: Self-pay | Admitting: Family Medicine

## 2016-02-27 NOTE — Telephone Encounter (Signed)
Pt is requesting a copy of last lab results.  CB#[phone]/MW

## 2016-02-27 NOTE — Telephone Encounter (Signed)
Left message on vm letting pt know lab results. Are ready to pick-up.

## 2016-03-08 ENCOUNTER — Other Ambulatory Visit: Payer: Self-pay | Admitting: Otolaryngology

## 2016-03-08 DIAGNOSIS — H905 Unspecified sensorineural hearing loss: Secondary | ICD-10-CM

## 2016-03-08 DIAGNOSIS — H9319 Tinnitus, unspecified ear: Secondary | ICD-10-CM

## 2016-03-08 DIAGNOSIS — R42 Dizziness and giddiness: Secondary | ICD-10-CM

## 2016-03-15 ENCOUNTER — Ambulatory Visit
Admission: RE | Admit: 2016-03-15 | Discharge: 2016-03-15 | Disposition: A | Discharge status: Home / Self Care | Payer: Medicare Other | Source: Ambulatory Visit | Attending: Otolaryngology | Admitting: Otolaryngology

## 2016-03-15 ENCOUNTER — Other Ambulatory Visit: Payer: Self-pay

## 2016-03-15 DIAGNOSIS — H905 Unspecified sensorineural hearing loss: Secondary | ICD-10-CM | POA: Insufficient documentation

## 2016-03-15 DIAGNOSIS — R490 Dysphonia: Secondary | ICD-10-CM | POA: Diagnosis present

## 2016-03-15 DIAGNOSIS — H9319 Tinnitus, unspecified ear: Secondary | ICD-10-CM | POA: Diagnosis present

## 2016-03-15 DIAGNOSIS — R42 Dizziness and giddiness: Secondary | ICD-10-CM

## 2016-03-15 MED ORDER — ZOLPIDEM TARTRATE 10 MG PO TABS: 10.0000 mg | ORAL_TABLET | Freq: Every day | ORAL | 5 refills | Status: DC

## 2016-03-15 MED ORDER — GADOBENATE DIMEGLUMINE 529 MG/ML IV SOLN
12.0000 mL | Freq: Once | INTRAVENOUS | Status: AC | PRN
  Administered 2016-03-15: 12 mL via INTRAVENOUS

## 2016-03-15 NOTE — Telephone Encounter (Signed)
Rx faxed to pharmacy  

## 2016-03-15 NOTE — Telephone Encounter (Signed)
Please call in zolpidem  

## 2016-04-04 ENCOUNTER — Other Ambulatory Visit: Payer: Self-pay | Admitting: Family Medicine

## 2016-04-04 ENCOUNTER — Ambulatory Visit
Admission: RE | Admit: 2016-04-04 | Discharge: 2016-04-04 | Disposition: A | Discharge status: Home / Self Care | Payer: Medicare Other | Source: Ambulatory Visit | Attending: Family Medicine | Admitting: Family Medicine

## 2016-04-04 DIAGNOSIS — Z1231 Encounter for screening mammogram for malignant neoplasm of breast: Secondary | ICD-10-CM | POA: Diagnosis not present

## 2016-05-10 ENCOUNTER — Encounter: Payer: Self-pay | Admitting: Family Medicine

## 2016-05-10 DIAGNOSIS — I679 Cerebrovascular disease, unspecified: Secondary | ICD-10-CM | POA: Insufficient documentation

## 2016-10-07 IMAGING — MG 2D DIGITAL SCREENING BILATERAL MAMMOGRAM WITH CAD AND ADJUNCT TO
9 of 13 series · 9 of 29 positions shown · non-contrast
Comparison: Previous exam(s).

CLINICAL DATA: Screening. History of benign right breast biopsy.

EXAM:
2D DIGITAL SCREENING BILATERAL MAMMOGRAM WITH CAD AND ADJUNCT TOMO

[L CC (1 of 2)]
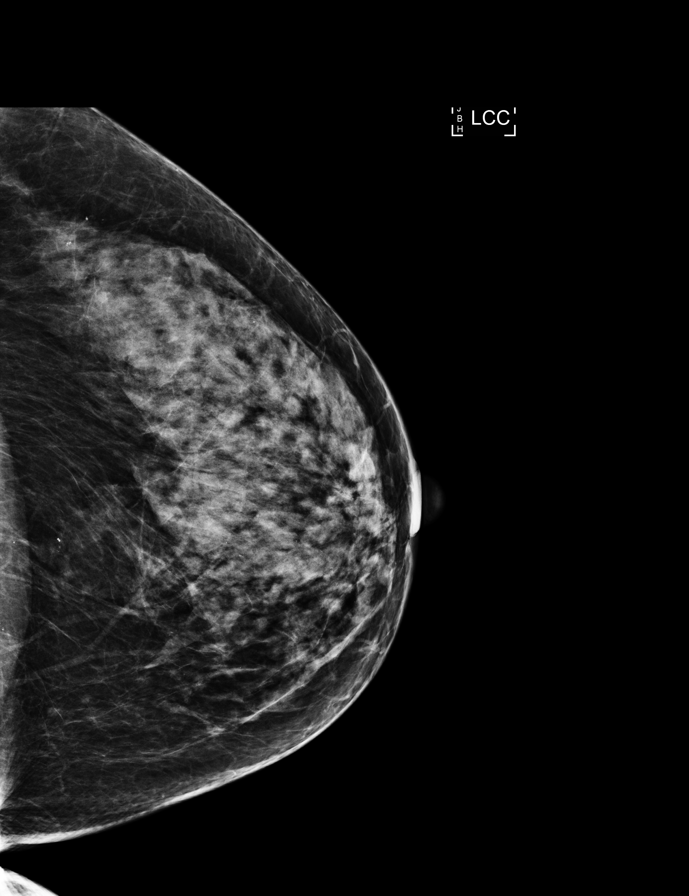

[R MLO synth-2D]
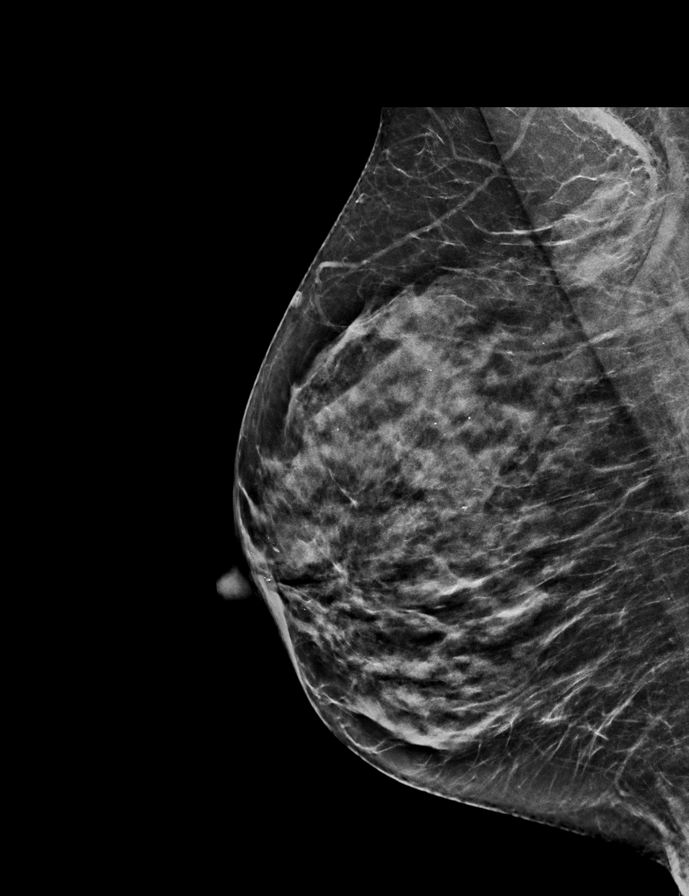

[R MLO]
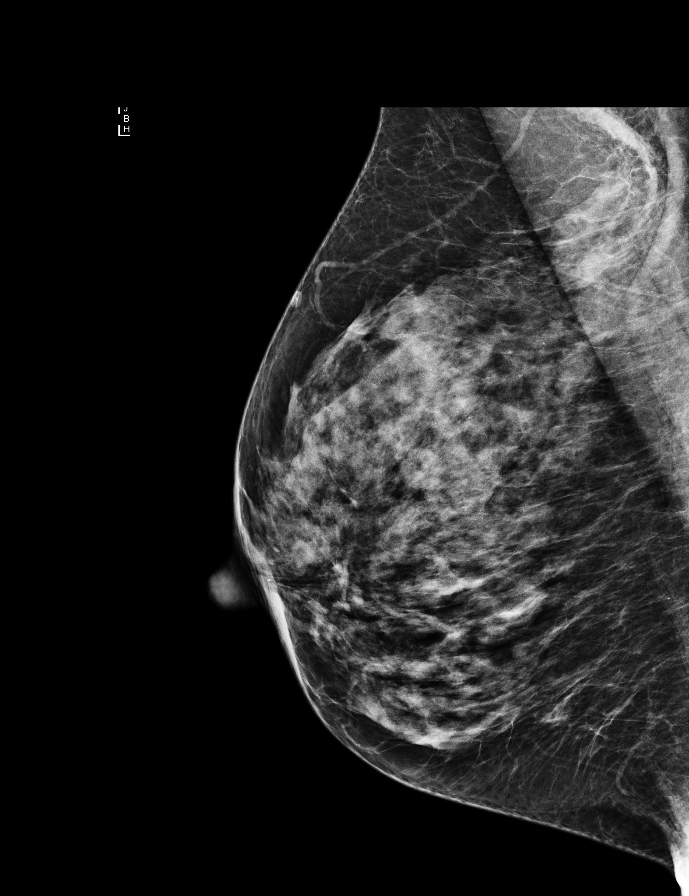

[L CC (2 of 2)]
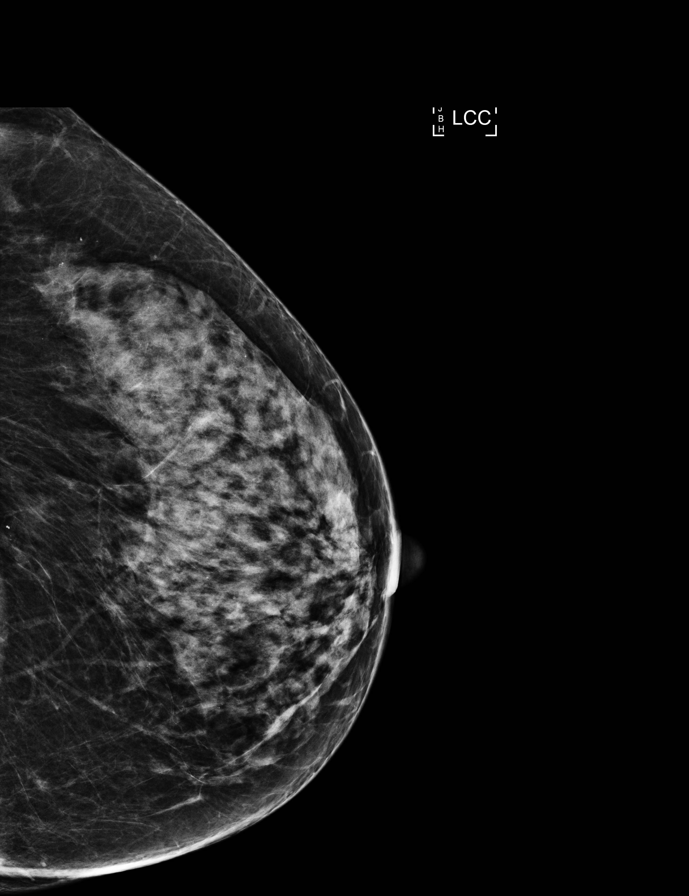

[R CC]
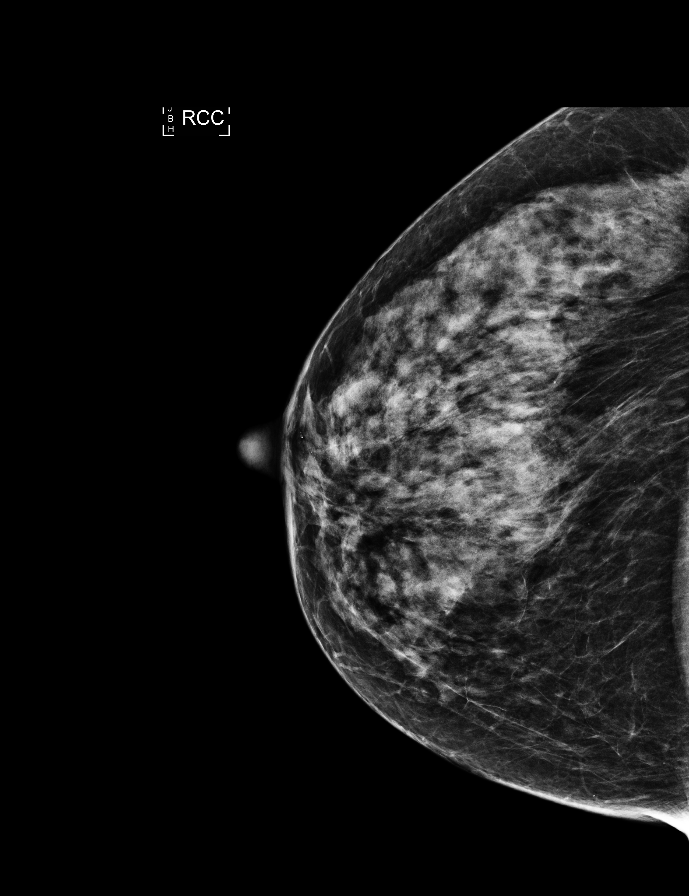

[L MLO]
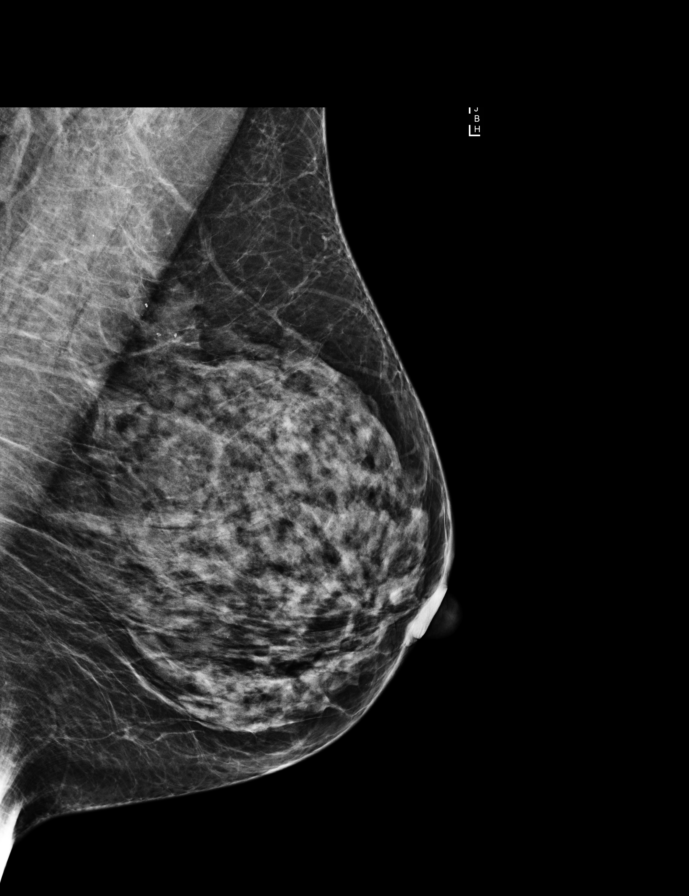

[R CC synth-2D]
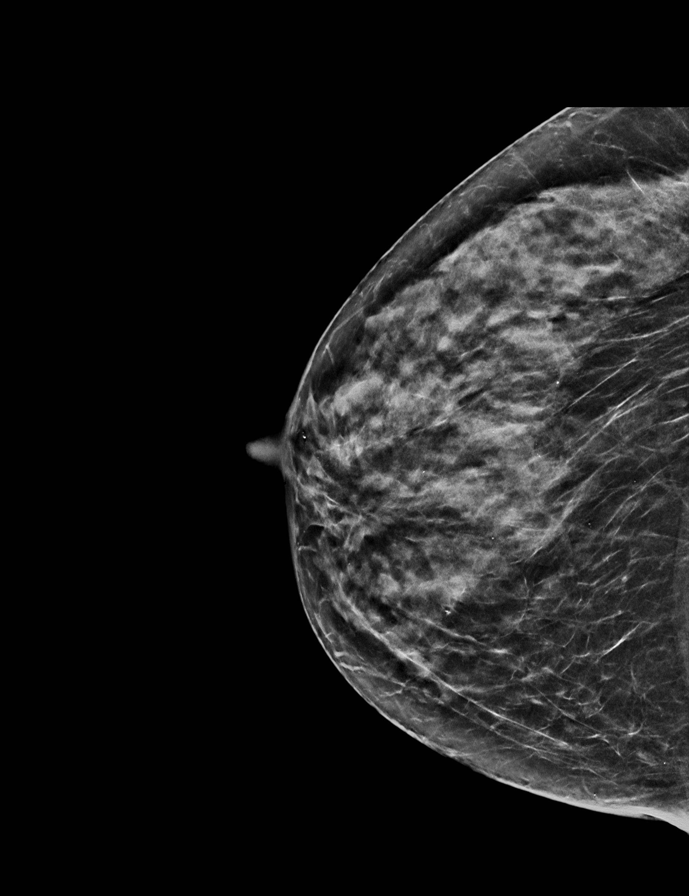

[L CC synth-2D]
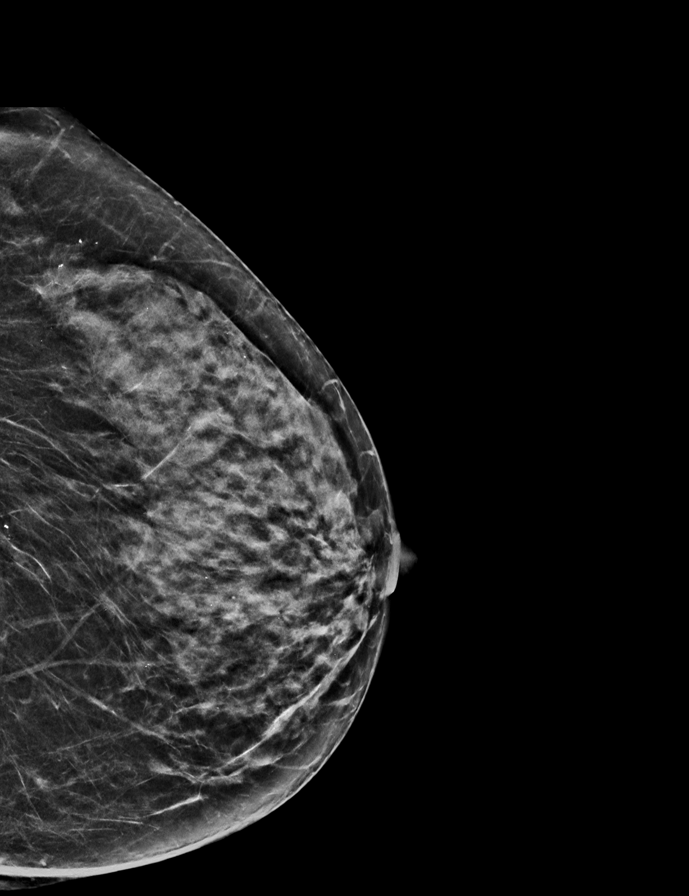

[L MLO synth-2D]
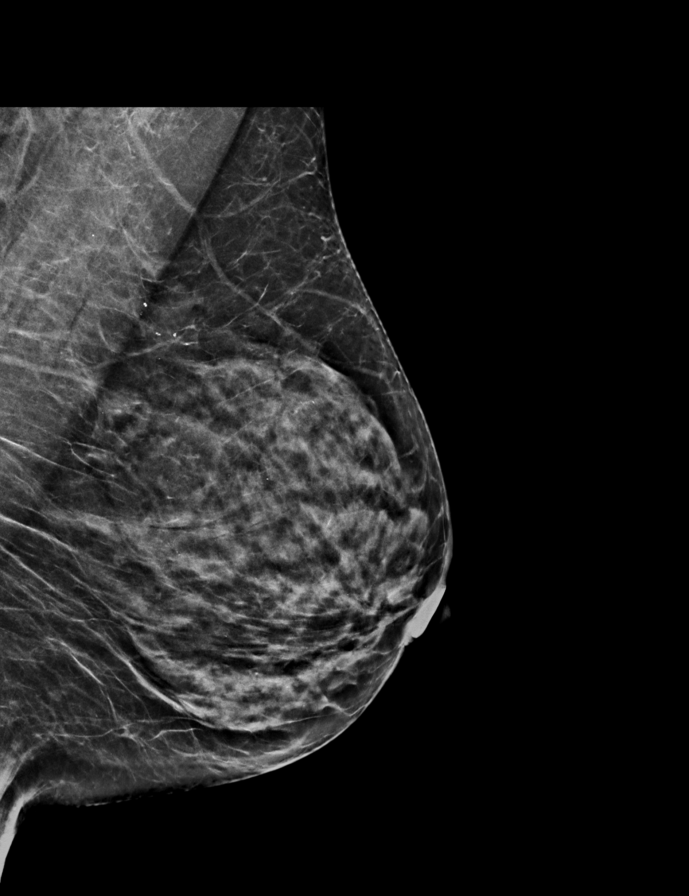

[9 of 29 positions shown; findings below may reference images not displayed]

ACR Breast Density Category c: The breast tissue is heterogeneously
dense, which may obscure small masses.
FINDINGS: There are stable postsurgical changes within the right breast. There
are no findings suspicious for malignancy within either breast.
Images were processed with CAD.
IMPRESSION: No mammographic evidence of malignancy. A result letter of this
screening mammogram will be mailed directly to the patient.

RECOMMENDATION:
Screening mammogram in one year. (Code:GD-N-M2C)

BI-RADS CATEGORY  2: Benign.

## 2016-12-30 ENCOUNTER — Ambulatory Visit (INDEPENDENT_AMBULATORY_CARE_PROVIDER_SITE_OTHER): Payer: Medicare Other | Admitting: Physician Assistant

## 2016-12-30 ENCOUNTER — Encounter: Payer: Self-pay | Admitting: Physician Assistant

## 2016-12-30 VITALS — BP 130/72 | HR 79 | Temp 97.9°F | Resp 16 | Wt 139.6 lb

## 2016-12-30 DIAGNOSIS — R300 Dysuria: Secondary | ICD-10-CM | POA: Diagnosis not present

## 2016-12-30 LAB — POCT URINALYSIS DIPSTICK
Bilirubin, UA: NEGATIVE
Blood, UA: NEGATIVE
Glucose, UA: NEGATIVE
Ketones, UA: NEGATIVE
LEUKOCYTES UA: NEGATIVE
NITRITE UA: NEGATIVE
PH UA: 6 (ref 5.0–8.0)
PROTEIN UA: NEGATIVE
Spec Grav, UA: 1.02 (ref 1.010–1.025)
Urobilinogen, UA: 0.2 E.U./dL

## 2016-12-30 MED ORDER — NITROFURANTOIN MONOHYD MACRO 100 MG PO CAPS: 100.0000 mg | ORAL_CAPSULE | Freq: Two times a day (BID) | ORAL | 0 refills | Status: DC

## 2016-12-30 NOTE — Patient Instructions (Signed)

## 2016-12-30 NOTE — Progress Notes (Signed)
Patient: Meredith Russell Female    DOB: 04/05/1951   66 y.o.   MRN: [id number] Visit Date: 12/30/2016  Today's Provider: Mar Daring, PA-C   Chief Complaint  Patient presents with  . Urinary Tract Infection   Subjective:    Urinary Tract Infection   This is a recurrent (She went Saturday 06/16 to the walk-in [facility] for pressure and cramping. Dx with UTI and took ciprofloxacin 500MG .) problem. The current episode started in the past 7 days. The problem occurs intermittently. The problem has been unchanged. The quality of the pain is described as aching and burning. There has been no fever. Associated symptoms include frequency and urgency (feeling like incomplete emptying). Pertinent negatives include no discharge, flank pain or hematuria. Associated symptoms comments: Still having pressure on lower abdomen.. She has tried antibiotics (AZO and Cipro) for the symptoms. The treatment provided mild relief. There is no history of kidney stones or recurrent UTIs.  She does report symptoms were improving slightly with cipro but did not completely resolve. The urinalysis from the walk in clinic did show few bacteria and WBC. The urine culture had no growth however.     Allergies  Allergen Reactions  . Lyrica  [Pregabalin]   . Sulfa Antibiotics   . Savella  [Milnacipran] Rash     Current Outpatient Prescriptions:  .  amLODipine (NORVASC) 2.5 MG tablet, Take 1 tablet by mouth daily., Disp: , Rfl:  .  aspirin 81 MG tablet, LOW-DOSE ASPIRIN, 81MG  (Oral Tablet)  1 Every Day for 0 days  Quantity: 0.00;  Refills: 0   Ordered :20-Sep-2010  Ashley Royalty ;  Started 21-Nov-2008 Active Comments: DX: 627.2, Disp: , Rfl:  .  diclofenac (VOLTAREN) 50 MG EC tablet, Take 1 tablet by mouth 2 (two) times daily., Disp: , Rfl:  .  diclofenac sodium (VOLTAREN) 1 % GEL, Apply topically., Disp: , Rfl:  .  fluticasone (FLONASE) 50 MCG/ACT nasal spray, Place into the nose., Disp: , Rfl:  .   HUMIRA PEN 40 MG/0.8ML PNKT, Inject 0.8 mLs as directed every 14 (fourteen) days., Disp: , Rfl:  .  leflunomide (ARAVA) 20 MG tablet, Take 20 mg by mouth daily. , Disp: , Rfl:  .  Loratadine-Pseudoephedrine (CLARITIN-D 24 HOUR PO), Take 1 tablet by mouth daily as needed., Disp: , Rfl:  .  meclizine (ANTIVERT) 12.5 MG tablet, Take 12.5 mg by mouth as needed. , Disp: , Rfl:  .  OMEGA-3 FATTY ACIDS PO, OMEGA-3 FATTY ACIDS, 1200MG  (Oral Capsule)  for 0 days  Quantity: 0.00;  Refills: 0   Ordered :20-Sep-2010  Ashley Royalty ;  Started 21-Nov-2008 Active Comments: DX: 714.0, Disp: , Rfl:  .  oxyCODONE-acetaminophen (PERCOCET) 7.5-325 MG per tablet, Take 1 tablet by mouth every 8 (eight) hours as needed. , Disp: , Rfl:  .  tiZANidine (ZANAFLEX) 2 MG tablet, Take 2 mg by mouth every 6 (six) hours as needed for muscle spasms., Disp: , Rfl:  .  vitamin C (ASCORBIC ACID) 500 MG tablet, VITAMIN C, 500MG  (Oral Tablet)  1 Every Day for 0 days  Quantity: 0.00;  Refills: 0   Ordered :28-Jul-2014  Abran Richard ;  Started 15-Nov-2008 Active Comments: DX: 627.2, Disp: , Rfl:  .  zolpidem (AMBIEN) 10 MG tablet, Take 1 tablet (10 mg total) by mouth at bedtime., Disp: 30 tablet, Rfl: 5 .  ondansetron (ZOFRAN) 4 MG tablet, Take 4 mg by mouth every 8 (eight) hours as  needed. , Disp: , Rfl:   Review of Systems  Constitutional: Negative.   Respiratory: Negative.   Cardiovascular: Negative.   Gastrointestinal: Negative.   Genitourinary: Positive for dysuria, frequency and urgency (feeling like incomplete emptying). Negative for flank pain, hematuria, vaginal bleeding, vaginal discharge and vaginal pain.  Musculoskeletal: Negative.     Social History  Substance Use Topics  . Smoking status: Former Smoker    Quit date: 11/22/1987  . Smokeless tobacco: Never Used  . Alcohol use Yes     Comment: occasional; drinks vodka and whiskey   Objective:   BP 130/72 (BP Location: Right Arm, Patient Position: Sitting, Cuff Size:  Normal)   Pulse 79   Temp 97.9 F (36.6 C) (Oral)   Resp 16   Wt 139 lb 9.6 oz (63.3 kg)   BMI 24.73 kg/m     Physical Exam  Constitutional: She is oriented to person, place, and time. She appears well-developed and well-nourished. No distress.  Cardiovascular: Normal rate, regular rhythm and normal heart sounds.  Exam reveals no gallop and no friction rub.   No murmur heard. Pulmonary/Chest: Effort normal and breath sounds normal. No respiratory distress. She has no wheezes. She has no rales.  Abdominal: Soft. Normal appearance and bowel sounds are normal. She exhibits no distension and no mass. There is no hepatosplenomegaly. There is tenderness in the suprapubic area. There is no rebound, no guarding and no CVA tenderness.  Suprapubic pressure   Neurological: She is alert and oriented to person, place, and time.  Skin: Skin is warm and dry. She is not diaphoretic.       Assessment & Plan:     1. Dysuria UA today was unremarkable. Will send for culture as below. Will treat with macrobid as below. If there is no improvement in symptoms and urine culture remains negative patient is to return for a pelvic exam to rule out cystocele vs vaginal atrophy as causes for her symptoms.  - POCT urinalysis dipstick - nitrofurantoin, macrocrystal-monohydrate, (MACROBID) 100 MG capsule; Take 1 capsule (100 mg total) by mouth 2 (two) times daily.  Dispense: 14 capsule; Refill: 0 - Urine Culture       Mar Daring, PA-C  [facility]

## 2017-01-01 LAB — URINE CULTURE

## 2017-01-01 NOTE — Progress Notes (Signed)
Advised  ED 

## 2017-01-03 ENCOUNTER — Ambulatory Visit (INDEPENDENT_AMBULATORY_CARE_PROVIDER_SITE_OTHER): Payer: Medicare Other | Admitting: Physician Assistant

## 2017-01-03 ENCOUNTER — Telehealth: Payer: Self-pay | Admitting: Family Medicine

## 2017-01-03 ENCOUNTER — Encounter: Payer: Self-pay | Admitting: Physician Assistant

## 2017-01-03 VITALS — BP 140/80 | HR 100 | Temp 97.9°F | Resp 16 | Wt 136.4 lb

## 2017-01-03 DIAGNOSIS — R252 Cramp and spasm: Secondary | ICD-10-CM

## 2017-01-03 DIAGNOSIS — K5909 Other constipation: Secondary | ICD-10-CM | POA: Diagnosis not present

## 2017-01-03 DIAGNOSIS — R300 Dysuria: Secondary | ICD-10-CM | POA: Diagnosis not present

## 2017-01-03 DIAGNOSIS — Z87891 Personal history of nicotine dependence: Secondary | ICD-10-CM

## 2017-01-03 DIAGNOSIS — Z90710 Acquired absence of both cervix and uterus: Secondary | ICD-10-CM

## 2017-01-03 NOTE — Telephone Encounter (Signed)
Pt decided to come in.  [place]

## 2017-01-03 NOTE — Progress Notes (Signed)
Patient: Meredith Russell Female    DOB: 11-Jan-1951   66 y.o.   MRN: [id number] Visit Date: 01/03/2017  Today's Provider: Mar Daring, PA-C   Chief Complaint  Patient presents with  . Follow-up    Urinary symptoms   Subjective:    HPI Patient here today with c/o still having urinary symptoms. Patient was seen for UTI's symptoms on 06/25 and urine culture was sent to the lab and showed no bacteria. Provider recommended that if symptoms improving with antibiotic it is ok to continue, if no improvement please discontinue and f/u for a pelvic exam. Patient still taking the antibiotic and c/o lower abdominal/cramping pain and cramps on her legs.    Allergies  Allergen Reactions  . Lyrica  [Pregabalin]   . Sulfa Antibiotics   . Savella  [Milnacipran] Rash     Current Outpatient Prescriptions:  .  amLODipine (NORVASC) 2.5 MG tablet, Take 1 tablet by mouth daily., Disp: , Rfl:  .  aspirin 81 MG tablet, LOW-DOSE ASPIRIN, 81MG  (Oral Tablet)  1 Every Day for 0 days  Quantity: 0.00;  Refills: 0   Ordered :20-Sep-2010  Ashley Royalty ;  Started 21-Nov-2008 Active Comments: DX: 627.2, Disp: , Rfl:  .  diclofenac sodium (VOLTAREN) 1 % GEL, Apply topically., Disp: , Rfl:  .  fluticasone (FLONASE) 50 MCG/ACT nasal spray, Place into the nose., Disp: , Rfl:  .  HUMIRA PEN 40 MG/0.8ML PNKT, Inject 0.8 mLs as directed every 14 (fourteen) days., Disp: , Rfl:  .  leflunomide (ARAVA) 20 MG tablet, Take 20 mg by mouth daily. , Disp: , Rfl:  .  Loratadine-Pseudoephedrine (CLARITIN-D 24 HOUR PO), Take 1 tablet by mouth daily as needed., Disp: , Rfl:  .  meclizine (ANTIVERT) 12.5 MG tablet, Take 12.5 mg by mouth as needed. , Disp: , Rfl:  .  nitrofurantoin, macrocrystal-monohydrate, (MACROBID) 100 MG capsule, Take 1 capsule (100 mg total) by mouth 2 (two) times daily., Disp: 14 capsule, Rfl: 0 .  OMEGA-3 FATTY ACIDS PO, OMEGA-3 FATTY ACIDS, 1200MG  (Oral Capsule)  for 0 days  Quantity: 0.00;   Refills: 0   Ordered :20-Sep-2010  Ashley Royalty ;  Started 21-Nov-2008 Active Comments: DX: 714.0, Disp: , Rfl:  .  ondansetron (ZOFRAN) 4 MG tablet, Take 4 mg by mouth every 8 (eight) hours as needed. , Disp: , Rfl:  .  oxyCODONE-acetaminophen (PERCOCET) 7.5-325 MG per tablet, Take 1 tablet by mouth every 8 (eight) hours as needed. , Disp: , Rfl:  .  tiZANidine (ZANAFLEX) 2 MG tablet, Take 2 mg by mouth every 6 (six) hours as needed for muscle spasms., Disp: , Rfl:  .  vitamin C (ASCORBIC ACID) 500 MG tablet, VITAMIN C, 500MG  (Oral Tablet)  1 Every Day for 0 days  Quantity: 0.00;  Refills: 0   Ordered :28-Jul-2014  Abran Richard ;  Started 15-Nov-2008 Active Comments: DX: 627.2, Disp: , Rfl:  .  zolpidem (AMBIEN) 10 MG tablet, Take 1 tablet (10 mg total) by mouth at bedtime., Disp: 30 tablet, Rfl: 5  Review of Systems  Constitutional: Negative.   Respiratory: Negative for cough, chest tightness and shortness of breath.   Cardiovascular: Negative for chest pain, palpitations and leg swelling.  Gastrointestinal: Positive for abdominal pain (pressure and pain).  Genitourinary: Positive for dyspareunia (occasionally), dysuria, frequency, pelvic pain and urgency (when she gets up at night to go and 1st thing in the morning). Negative for decreased urine volume, flank  pain, hematuria, menstrual problem and vaginal discharge.  Musculoskeletal: Positive for arthralgias and myalgias.       Leg cramping    Social History  Substance Use Topics  . Smoking status: Former Smoker    Quit date: 11/22/1987  . Smokeless tobacco: Never Used  . Alcohol use Yes     Comment: occasional; drinks vodka and whiskey   Objective:   BP 140/80 (BP Location: Left Arm, Patient Position: Sitting, Cuff Size: Normal)   Pulse 100   Temp 97.9 F (36.6 C) (Oral)   Resp 16   Wt 136 lb 6.4 oz (61.9 kg)   BMI 24.16 kg/m    Physical Exam  Constitutional: She appears well-developed and well-nourished. No distress.  Neck:  Normal range of motion. Neck supple.  Cardiovascular: Normal rate, regular rhythm and normal heart sounds.  Exam reveals no gallop and no friction rub.   No murmur heard. Pulmonary/Chest: Effort normal and breath sounds normal. No respiratory distress. She has no wheezes. She has no rales.  Genitourinary: There is no rash, tenderness, lesion or injury on the right labia. There is no rash, tenderness, lesion or injury on the left labia. No erythema or tenderness in the vagina. No vaginal discharge found.  Genitourinary Comments: No cystocele or rectocele noted on exam  Skin: She is not diaphoretic.  Vitals reviewed.      Assessment & Plan:     1. Dysuria She has had two consecutive urine cultures return normal and had no change in symptoms with antibiotic. Antibiotics discontinued. She reports she is having increased lower abdominal pressure with increased urination that becomes more prominent by midday. In the morning she does feel the suprapubic pressure only has urine urgency (has to go as soon as she gets up). She has never had a urology evaluation. She does report that when she had her hysterectomy there were so many adhesions they had trouble getting the uterus out and actually cut the bladder and had to have that repaired. DDx: scar tissue around the bladder, OAB, incomplete emptying of the bladder due to other causes, constipation compressing the bladder, worsening back issues causing neurogenic type bladder, or bladder cancer. Will refer to Urology for further evaluation.   2. H/O total hysterectomy Patient reports having bladder cut during hysterectomy due to adhesions and difficulty removing the uterus.   3. History of tobacco use Increases risk for bladder cancer. No hematuria on last urinalysis.   4. Cramps of lower extremity Possibly worsening RA and DDD. Patient is scheduled to see Dr. Sharlet Salina next month. Advised her to discuss with him. Will add magnesium 250mg  once or twice  daily at this time.   5. Chronic constipation Patient currently does not take anything for constipation regularly. Will see if magnesium helps regulate some. Continue to push fluids. It is possible constipation is source of bladder pressure.        Mar Daring, PA-C  [facility]

## 2017-01-03 NOTE — Patient Instructions (Addendum)
Magnesium 250mg  once or twice daily for cramping.  Can use tumeric and/or garlic as well for inflammation. Will place referral to Urology.

## 2017-01-03 NOTE — Telephone Encounter (Signed)
Pt called saying she was in this week and seen Sonia Baller for a UTI.  It was negative but she is still having symptoms.    Please advise  Her call back is [phone]  Thanks teri

## 2017-01-09 ENCOUNTER — Other Ambulatory Visit: Payer: Self-pay | Admitting: Family Medicine

## 2017-01-09 NOTE — Telephone Encounter (Signed)
To mail order pharmacy-[facility]

## 2017-01-09 NOTE — Telephone Encounter (Signed)
[facility] faxed a request on the following medication. Thanks CC ° °zolpidem (AMBIEN) 10 MG tablet  ° °

## 2017-01-10 MED ORDER — ZOLPIDEM TARTRATE 10 MG PO TABS: 10.0000 mg | ORAL_TABLET | Freq: Every day | ORAL | 4 refills | Status: DC

## 2017-01-10 MED ORDER — ZOLPIDEM TARTRATE 10 MG PO TABS: 10.0000 mg | ORAL_TABLET | Freq: Every day | ORAL | 5 refills | Status: DC

## 2017-01-10 NOTE — Telephone Encounter (Signed)
rx called in, mail order pharmacist said they can only authorize 4 additional refills so I updated that in the chart. 4 refills called in not 5. FYI-aa

## 2017-01-10 NOTE — Telephone Encounter (Signed)
Please call in zolpidem  

## 2017-01-27 ENCOUNTER — Other Ambulatory Visit: Payer: Self-pay | Admitting: Physical Medicine and Rehabilitation

## 2017-01-27 DIAGNOSIS — M5416 Radiculopathy, lumbar region: Secondary | ICD-10-CM

## 2017-01-30 ENCOUNTER — Ambulatory Visit (INDEPENDENT_AMBULATORY_CARE_PROVIDER_SITE_OTHER): Payer: Medicare Other | Admitting: Urology

## 2017-01-30 ENCOUNTER — Encounter: Payer: Self-pay | Admitting: Urology

## 2017-01-30 VITALS — BP 148/93 | HR 98 | Ht 63.0 in | Wt 138.3 lb

## 2017-01-30 DIAGNOSIS — N3281 Overactive bladder: Secondary | ICD-10-CM | POA: Diagnosis not present

## 2017-01-30 DIAGNOSIS — R300 Dysuria: Secondary | ICD-10-CM | POA: Diagnosis not present

## 2017-01-30 LAB — URINALYSIS, COMPLETE
Bilirubin, UA: NEGATIVE
Glucose, UA: NEGATIVE
KETONES UA: NEGATIVE
LEUKOCYTES UA: NEGATIVE
Nitrite, UA: NEGATIVE
Protein, UA: NEGATIVE
RBC, UA: NEGATIVE
Urobilinogen, Ur: 0.2 mg/dL (ref 0.2–1.0)
pH, UA: 6.5 (ref 5.0–7.5)

## 2017-01-30 LAB — BLADDER SCAN AMB NON-IMAGING: Scan Result: 158

## 2017-01-30 LAB — MICROSCOPIC EXAMINATION

## 2017-01-30 MED ORDER — MIRABEGRON ER 25 MG PO TB24: 25.0000 mg | ORAL_TABLET | Freq: Every day | ORAL | 11 refills | Status: DC

## 2017-01-30 MED ORDER — URIBEL 118 MG PO CAPS: ORAL_CAPSULE | ORAL | 3 refills | Status: DC

## 2017-01-30 NOTE — Progress Notes (Signed)
01/30/2017 3:19 PM   Meredith Russell Nov 12, 1950 [id number]  Referring provider: Mar Daring, PA-C [address] [place], [place] [address]  Chief Complaint  Patient presents with  . Dysuria    HPI: The patient is a 66 year old female presents today for dysuria. She has had 2 negative urine cultures treated with antibiotics with no resolution in symptoms.  This has been only going on for 1 month. She does not have symptoms prior to this. She is most concerned by dysuria particularly after voiding. She also feels pressure in the suprapubic region after voiding as well. She does note severe new onset urgency Though she has not had episodes of  incontinence, she has been close to it. She finds is very bothersome especially since she did not have this problem before. Her last documented urine tract infection was approximately 40 years ago. She does have a very distant smoking history. She had mild relief on Azo.  Random bladder scan revealed 158 cc.   PMH: History reviewed. No pertinent past medical history.  Surgical History: Past Surgical History:  Procedure Laterality Date  . ABDOMINAL HYSTERECTOMY    . BACK SURGERY    . BREAST BIOPSY Right    neg  . BUNIONECTOMY    . CARPAL TUNNEL RELEASE Bilateral 09/2013  . EYE SURGERY    . HEMORROIDECTOMY    . JOINT REPLACEMENT      Home Medications:  Allergies as of 01/30/2017      Reactions   Lyrica  [pregabalin]    Sulfa Antibiotics    Savella  [milnacipran] Rash      Medication List       Accurate as of 01/30/17  3:19 PM. Always use your most recent med list.          amLODipine 2.5 MG tablet Commonly known as:  NORVASC Take 1 tablet by mouth daily.   aspirin 81 MG tablet LOW-DOSE ASPIRIN, 81MG  (Oral Tablet)  1 Every Day for 0 days  Quantity: 0.00;  Refills: 0   Ordered :20-Sep-2010  Ashley Royalty ;  Started 21-Nov-2008 Active Comments: DX: 627.2   CLARITIN-D 24 HOUR PO Take 1 tablet by mouth daily  as needed.   diclofenac sodium 1 % Gel Commonly known as:  VOLTAREN Apply topically.   fluticasone 50 MCG/ACT nasal spray Commonly known as:  FLONASE Place into the nose.   HUMIRA PEN 40 MG/0.8ML Pnkt Generic drug:  Adalimumab Inject 0.8 mLs as directed every 14 (fourteen) days.   leflunomide 20 MG tablet Commonly known as:  ARAVA Take 20 mg by mouth daily.   meclizine 12.5 MG tablet Commonly known as:  ANTIVERT Take 12.5 mg by mouth as needed.   OMEGA-3 FATTY ACIDS PO OMEGA-3 FATTY ACIDS, 1200MG  (Oral Capsule)  for 0 days  Quantity: 0.00;  Refills: 0   Ordered :20-Sep-2010  Ashley Royalty ;  Started 21-Nov-2008 Active Comments: DX: 714.0   ondansetron 4 MG tablet Commonly known as:  ZOFRAN Take 4 mg by mouth every 8 (eight) hours as needed.   oxyCODONE-acetaminophen 7.5-325 MG tablet Commonly known as:  PERCOCET Take 1 tablet by mouth every 8 (eight) hours as needed.   tiZANidine 2 MG tablet Commonly known as:  ZANAFLEX Take 2 mg by mouth every 6 (six) hours as needed for muscle spasms.   vitamin C 500 MG tablet Commonly known as:  ASCORBIC ACID VITAMIN C, 500MG  (Oral Tablet)  1 Every Day for 0 days  Quantity: 0.00;  Refills: 0   Ordered :28-Jul-2014  Abran Richard ;  Started 15-Nov-2008 Active Comments: DX: 627.2   zolpidem 10 MG tablet Commonly known as:  AMBIEN Take 1 tablet (10 mg total) by mouth at bedtime.       Allergies:  Allergies  Allergen Reactions  . Lyrica  [Pregabalin]   . Sulfa Antibiotics   . Savella  [Milnacipran] Rash    Family History: Family History  Problem Relation Age of Onset  . Heart disease Mother   . Heart attack Mother   . Heart disease Father   . Cancer Father   . Diabetes Other   . Heart disease Other   . Hyperlipidemia Other   . Hypertension Other     Social History:  reports that she quit smoking about 29 years ago. She has never used smokeless tobacco. She reports that she drinks alcohol. She reports that she does not  use drugs.  ROS: UROLOGY Frequent Urination?: No Hard to postpone urination?: No Burning/pain with urination?: Yes Get up at night to urinate?: Yes Leakage of urine?: No Urine stream starts and stops?: Yes Trouble starting stream?: Yes Do you have to strain to urinate?: Yes Blood in urine?: No Urinary tract infection?: Yes Sexually transmitted disease?: No Injury to kidneys or bladder?: No Painful intercourse?: No Weak stream?: Yes Currently pregnant?: No Vaginal bleeding?: No Last menstrual period?: n  Gastrointestinal Nausea?: Yes Vomiting?: No Indigestion/heartburn?: Yes Diarrhea?: No Constipation?: Yes  Constitutional Fever: No Night sweats?: No Weight loss?: Yes Fatigue?: Yes  Skin Skin rash/lesions?: Yes Itching?: Yes  Eyes Blurred vision?: Yes Double vision?: No  Ears/Nose/Throat Sore throat?: No Sinus problems?: Yes  Hematologic/Lymphatic Swollen glands?: No Easy bruising?: No  Cardiovascular Leg swelling?: No Chest pain?: No  Respiratory Cough?: No Shortness of breath?: No  Endocrine Excessive thirst?: Yes  Musculoskeletal Back pain?: Yes Joint pain?: Yes  Neurological Headaches?: Yes Dizziness?: Yes  Psychologic Depression?: No Anxiety?: No  Physical Exam: BP (!) 148/93 (BP Location: Left Arm, Patient Position: Sitting, Cuff Size: Normal)   Pulse 98   Ht 5\' 3"  (1.6 m)   Wt 138 lb 4.8 oz (62.7 kg)   BMI 24.50 kg/m   Constitutional:  Alert and oriented, No acute distress. HEENT: [place] AT, moist mucus membranes.  Trachea midline, no masses. Cardiovascular: No clubbing, cyanosis, or edema. Respiratory: Normal respiratory effort, no increased work of breathing. GI: Abdomen is soft, nontender, nondistended, no abdominal masses GU: No CVA tenderness. Normal external genitalia. Vaginal atrophy noted. No cystocele. Urethral meatus within normal limits. No hypermobility or tenderness to palpation. Skin: No rashes, bruises or  suspicious lesions. Lymph: No cervical or inguinal adenopathy. Neurologic: Grossly intact, no focal deficits, moving all 4 extremities. Psychiatric: Normal mood and affect.  Laboratory Data: Lab Results  Component Value Date   WBC 4.8 02/22/2016   HGB 12.8 02/22/2016   HCT 38.4 02/22/2016   MCV 94 02/22/2016   PLT 356 02/22/2016    Lab Results  Component Value Date   CREATININE 0.86 02/22/2016    No results found for: PSA  No results found for: TESTOSTERONE  No results found for: HGBA1C  Urinalysis    Component Value Date/Time   BILIRUBINUR negative 12/30/2016 1450   PROTEINUR negative 12/30/2016 1450   UROBILINOGEN 0.2 12/30/2016 1450   NITRITE negative 12/30/2016 1450   LEUKOCYTESUR Negative 12/30/2016 1450     Assessment & Plan:    1. Dysuria 2. Overactive bladder I did discuss the patient that her  symptoms though similar to UTI cannot be considered one as her cultures have been negative. I discussed with her that we will try symptomatic relief of her symptoms with Uribel for her dysuria and Mybetriq for her urgency. We will have her follow-up in one month to assess her symptoms. If they persist, we will plan for cystoscopy at that time.  Return in about 4 weeks (around 02/27/2017) for possible cysto.  Nickie Retort, MD  [facility] [address] [place], [place] [address] [phone]

## 2017-01-31 ENCOUNTER — Ambulatory Visit
Admission: RE | Admit: 2017-01-31 | Discharge: 2017-01-31 | Disposition: A | Discharge status: Home / Self Care | Payer: Medicare Other | Source: Ambulatory Visit | Attending: Physical Medicine and Rehabilitation | Admitting: Physical Medicine and Rehabilitation

## 2017-01-31 DIAGNOSIS — M5416 Radiculopathy, lumbar region: Secondary | ICD-10-CM | POA: Insufficient documentation

## 2017-01-31 DIAGNOSIS — M4316 Spondylolisthesis, lumbar region: Secondary | ICD-10-CM | POA: Insufficient documentation

## 2017-02-26 ENCOUNTER — Ambulatory Visit (INDEPENDENT_AMBULATORY_CARE_PROVIDER_SITE_OTHER): Payer: Medicare Other

## 2017-02-26 ENCOUNTER — Encounter: Payer: Self-pay | Admitting: Family Medicine

## 2017-02-26 ENCOUNTER — Ambulatory Visit (INDEPENDENT_AMBULATORY_CARE_PROVIDER_SITE_OTHER): Payer: Medicare Other | Admitting: Family Medicine

## 2017-02-26 VITALS — BP 144/88

## 2017-02-26 VITALS — BP 152/88 | HR 76 | Temp 98.3°F | Ht 63.0 in | Wt 137.6 lb

## 2017-02-26 DIAGNOSIS — I10 Essential (primary) hypertension: Secondary | ICD-10-CM

## 2017-02-26 DIAGNOSIS — M8580 Other specified disorders of bone density and structure, unspecified site: Secondary | ICD-10-CM

## 2017-02-26 DIAGNOSIS — Z23 Encounter for immunization: Secondary | ICD-10-CM

## 2017-02-26 DIAGNOSIS — Z0000 Encounter for general adult medical examination without abnormal findings: Secondary | ICD-10-CM

## 2017-02-26 DIAGNOSIS — E2839 Other primary ovarian failure: Secondary | ICD-10-CM

## 2017-02-26 DIAGNOSIS — G4700 Insomnia, unspecified: Secondary | ICD-10-CM

## 2017-02-26 DIAGNOSIS — M5030 Other cervical disc degeneration, unspecified cervical region: Secondary | ICD-10-CM | POA: Diagnosis not present

## 2017-02-26 MED ORDER — FLUTICASONE PROPIONATE 50 MCG/ACT NA SUSP: 2.0000 | Freq: Every day | NASAL | 3 refills | Status: DC

## 2017-02-26 MED ORDER — ZOLPIDEM TARTRATE 10 MG PO TABS: 10.0000 mg | ORAL_TABLET | Freq: Every day | ORAL | 4 refills | Status: DC

## 2017-02-26 MED ORDER — LORATADINE-PSEUDOEPHEDRINE ER 10-240 MG PO TB24: 1.0000 | ORAL_TABLET | Freq: Every day | ORAL | Status: DC | PRN

## 2017-02-26 MED ORDER — CLARITIN-D 24 HOUR 10-240 MG PO TB24: 1.0000 | ORAL_TABLET | Freq: Every day | ORAL | 0 refills | Status: DC | PRN

## 2017-02-26 NOTE — Progress Notes (Signed)
Subjective:   Meredith Russell is a 66 y.o. female who presents for Medicare Annual (Subsequent) preventive examination.  Review of Systems:  N/A  Cardiac Risk Factors include: advanced age (>68men, >76 women);hypertension     Objective:     Vitals: BP (!) 152/88 (BP Location: Left Arm)   Pulse 76   Temp 98.3 F (36.8 C) (Oral)   Ht 5\' 3"  (1.6 m)   Wt 137 lb 9.6 oz (62.4 kg)   BMI 24.37 kg/m   Body mass index is 24.37 kg/m.   Tobacco History  Smoking Status  . Former Smoker  . Quit date: 11/22/1987  Smokeless Tobacco  . Never Used     Counseling given: Not Answered   History reviewed. No pertinent past medical history. Past Surgical History:  Procedure Laterality Date  . ABDOMINAL HYSTERECTOMY    . BACK SURGERY    . BREAST BIOPSY Right    neg  . BUNIONECTOMY    . CARPAL TUNNEL RELEASE Bilateral 09/2013  . EYE SURGERY    . HEMORROIDECTOMY    . JOINT REPLACEMENT     Family History  Problem Relation Age of Onset  . Heart disease Mother   . Heart attack Mother   . Heart disease Father   . Cancer Father   . Diabetes Other   . Heart disease Other   . Hyperlipidemia Other   . Hypertension Other    History  Sexual Activity  . Sexual activity: Not on file    Outpatient Encounter Prescriptions as of 02/26/2017  Medication Sig  . amLODipine (NORVASC) 2.5 MG tablet Take 1 tablet by mouth daily.  Marland Kitchen aspirin 81 MG tablet LOW-DOSE ASPIRIN, 81MG  (Oral Tablet)  1 Every Day for 0 days  Quantity: 0.00;  Refills: 0   Ordered :20-Sep-2010  Ashley Royalty ;  Started 21-Nov-2008 Active Comments: DX: 627.2  . Biotin 5 MG TABS Take 5,000 each by mouth daily.  . Biotin w/ Vitamins C & E (HAIR/SKIN/NAILS PO) Take by mouth daily.  . Cholecalciferol (VITAMIN D3) 1000 units CAPS Take by mouth daily.  . diclofenac (VOLTAREN) 50 MG EC tablet Take 50 mg by mouth 2 (two) times daily.  . diclofenac sodium (VOLTAREN) 1 % GEL Apply topically.  . fluticasone (FLONASE) 50 MCG/ACT nasal  spray Place into the nose.  Marland Kitchen HUMIRA PEN 40 MG/0.8ML PNKT Inject 0.8 mLs as directed every 14 (fourteen) days.  Marland Kitchen leflunomide (ARAVA) 20 MG tablet Take 20 mg by mouth daily.   . Loratadine-Pseudoephedrine (CLARITIN-D 24 HOUR PO) Take 1 tablet by mouth daily as needed.  . OMEGA-3 FATTY ACIDS PO OMEGA-3 FATTY ACIDS, 1200MG  (Oral Capsule)  for 0 days  Quantity: 0.00;  Refills: 0   Ordered :20-Sep-2010  Ashley Royalty ;  Started 21-Nov-2008 Active Comments: DX: 714.0  . oxyCODONE-acetaminophen (PERCOCET) 7.5-325 MG per tablet Take 1 tablet by mouth every 8 (eight) hours as needed.   Marland Kitchen tiZANidine (ZANAFLEX) 2 MG tablet Take 2 mg by mouth every 6 (six) hours as needed for muscle spasms.  . vitamin C (ASCORBIC ACID) 500 MG tablet VITAMIN C, 500MG  (Oral Tablet)  1 Every Day for 0 days  Quantity: 0.00;  Refills: 0   Ordered :28-Jul-2014  Abran Richard ;  Started 15-Nov-2008 Active Comments: DX: 627.2  . zolpidem (AMBIEN) 10 MG tablet Take 1 tablet (10 mg total) by mouth at bedtime.  . meclizine (ANTIVERT) 12.5 MG tablet Take 12.5 mg by mouth as needed.   . ondansetron (  ZOFRAN) 4 MG tablet Take 4 mg by mouth every 8 (eight) hours as needed.   . [DISCONTINUED] Meth-Hyo-M Bl-Na Phos-Ph Sal (URIBEL) 118 MG CAPS Take up to 4 tablets PO daily with a large glass of water  . [DISCONTINUED] mirabegron ER (MYRBETRIQ) 25 MG TB24 tablet Take 1 tablet (25 mg total) by mouth daily.   No facility-administered encounter medications on file as of 02/26/2017.     Activities of Daily Living In your present state of health, do you have any difficulty performing the following activities: 02/26/2017  Hearing? N  Vision? N  Difficulty concentrating or making decisions? N  Walking or climbing stairs? N  Dressing or bathing? N  Doing errands, shopping? N  Preparing Food and eating ? N  Using the Toilet? N  In the past six months, have you accidently leaked urine? N  Do you have problems with loss of bowel control? N  Managing  your Medications? N  Managing your Finances? N  Housekeeping or managing your Housekeeping? N  Some recent data might be hidden    Patient Care Team: Birdie Sons, MD as PCP - General (Family Medicine) Emmaline Kluver., MD (Rheumatology) Nickie Retort, MD as Consulting Physician (Urology) Samara Deist, DPM as Referring Physician (Podiatry)    Assessment:     Exercise Activities and Dietary recommendations Current Exercise Habits: Home exercise routine, Type of exercise: walking, Time (Minutes): 10 (or less), Frequency (Times/Week): 7, Weekly Exercise (Minutes/Week): 70, Intensity: Mild, Exercise limited by: orthopedic condition(s)  Goals    . Increase water intake          Recommend increasing water intake to 6-8 glasses a day.       Fall Risk Fall Risk  02/26/2017 02/22/2016 01/24/2016 01/23/2015  Falls in the past year? No No No No   Depression Screen PHQ 2/9 Scores 02/26/2017 02/26/2017 02/22/2016 01/24/2016  PHQ - 2 Score 0 0 0 0  PHQ- 9 Score 5 - - -     Cognitive Function- Pt declined screening today.        Immunization History  Administered Date(s) Administered  . PPD Test 09/11/2015  . Pneumococcal Conjugate-13 01/23/2015  . Pneumococcal Polysaccharide-23 02/22/2016  . Td 02/26/2017  . Tdap 10/07/2006  . Zoster 07/06/2012   Screening Tests Health Maintenance  Topic Date Due  . INFLUENZA VACCINE  02/05/2017  . MAMMOGRAM  04/04/2018  . COLONOSCOPY  03/05/2022  . TETANUS/TDAP  02/27/2027  . DEXA SCAN  Completed  . Hepatitis C Screening  Completed  . PNA vac Low Risk Adult  Completed      Plan:  I have personally reviewed and addressed the Medicare Annual Wellness questionnaire and have noted the following in the patient's chart:  A. Medical and social history B. Use of alcohol, tobacco or illicit drugs  C. Current medications and supplements D. Functional ability and status E.  Nutritional status F.  Physical activity G. Advance  directives H. List of other physicians I.  Hospitalizations, surgeries, and ER visits in previous 12 months J.  [place] such as hearing and vision if needed, cognitive and depression L. Referrals and appointments - none  In addition, I have reviewed and discussed with patient certain preventive protocols, quality metrics, and best practice recommendations. A written personalized care plan for preventive services as well as general preventive health recommendations were provided to patient.  See attached scanned questionnaire for additional information.   Signed,  Fabio Neighbors, LPN Nurse  Health Advisor   MD Recommendations: None. Declined influenza vaccine today.

## 2017-02-26 NOTE — Patient Instructions (Addendum)
Meredith Russell , Thank you for taking time to come for your Medicare Wellness Visit. I appreciate your ongoing commitment to your health goals. Please review the following plan we discussed and let me know if I can assist you in the future.   Screening recommendations/referrals: Colonoscopy: up to date, due 02/2022 Mammogram: up to date, due 03/2017 Bone Density: up to date Recommended yearly ophthalmology/optometry visit for glaucoma screening and checkup Recommended yearly dental visit for hygiene and checkup  Vaccinations: Influenza vaccine: declined Pneumococcal vaccine: completed series Tdap vaccine: completed today Shingles vaccine: completed 07/06/12  Advanced directives: Please bring a copy of your POA (Power of [place]) and/or Living Will to your next appointment.   Conditions/risks identified: Recommend increasing water intake to 6-8 glasses a day.   Next appointment: None, need to schedule 1 year AWV.   Preventive Care 41 Years and Older, Female Preventive care refers to lifestyle choices and visits with your health care provider that can promote health and wellness. What does preventive care include?  A yearly physical exam. This is also called an annual well check.  Dental exams once or twice a year.  Routine eye exams. Ask your health care provider how often you should have your eyes checked.  Personal lifestyle choices, including:  Daily care of your teeth and gums.  Regular physical activity.  Eating a healthy diet.  Avoiding tobacco and drug use.  Limiting alcohol use.  Practicing safe sex.  Taking low-dose aspirin every day.  Taking vitamin and mineral supplements as recommended by your health care provider. What happens during an annual well check? The services and screenings done by your health care provider during your annual well check will depend on your age, overall health, lifestyle risk factors, and family history of disease. Counseling    Your health care provider may ask you questions about your:  Alcohol use.  Tobacco use.  Drug use.  Emotional well-being.  Home and relationship well-being.  Sexual activity.  Eating habits.  History of falls.  Memory and ability to understand (cognition).  Work and work Statistician.  Reproductive health. Screening  You may have the following tests or measurements:  Height, weight, and BMI.  Blood pressure.  Lipid and cholesterol levels. These may be checked every 5 years, or more frequently if you are over 39 years old.  Skin check.  Lung cancer screening. You may have this screening every year starting at age 10 if you have a 30-pack-year history of smoking and currently smoke or have quit within the past 15 years.  Fecal occult blood test (FOBT) of the stool. You may have this test every year starting at age 33.  Flexible sigmoidoscopy or colonoscopy. You may have a sigmoidoscopy every 5 years or a colonoscopy every 10 years starting at age 67.  Hepatitis C blood test.  Hepatitis B blood test.  Sexually transmitted disease (STD) testing.  Diabetes screening. This is done by checking your blood sugar (glucose) after you have not eaten for a while (fasting). You may have this done every 1-3 years.  Bone density scan. This is done to screen for osteoporosis. You may have this done starting at age 49.  Mammogram. This may be done every 1-2 years. Talk to your health care provider about how often you should have regular mammograms. Talk with your health care provider about your test results, treatment options, and if necessary, the need for more tests. Vaccines  Your health care provider may recommend certain vaccines, such  as:  Influenza vaccine. This is recommended every year.  Tetanus, diphtheria, and acellular pertussis (Tdap, Td) vaccine. You may need a Td booster every 10 years.  Zoster vaccine. You may need this after age 27.  Pneumococcal 13-valent  conjugate (PCV13) vaccine. One dose is recommended after age 101.  Pneumococcal polysaccharide (PPSV23) vaccine. One dose is recommended after age 1. Talk to your health care provider about which screenings and vaccines you need and how often you need them. This information is not intended to replace advice given to you by your health care provider. Make sure you discuss any questions you have with your health care provider. Document Released: 07/21/2015 Document Revised: 03/13/2016 Document Reviewed: 04/25/2015 Elsevier Interactive Patient Education  2017 [place] Prevention in the Home Falls can cause injuries. They can happen to people of all ages. There are many things you can do to make your home safe and to help prevent falls. What can I do on the outside of my home?  Regularly fix the edges of walkways and driveways and fix any cracks.  Remove anything that might make you trip as you walk through a door, such as a raised step or threshold.  Trim any bushes or trees on the path to your home.  Use bright outdoor lighting.  Clear any walking paths of anything that might make someone trip, such as rocks or tools.  Regularly check to see if handrails are loose or broken. Make sure that both sides of any steps have handrails.  Any raised decks and porches should have guardrails on the edges.  Have any leaves, snow, or ice cleared regularly.  Use sand or salt on walking paths during winter.  Clean up any spills in your garage right away. This includes oil or grease spills. What can I do in the bathroom?  Use night lights.  Install grab bars by the toilet and in the tub and shower. Do not use towel bars as grab bars.  Use non-skid mats or decals in the tub or shower.  If you need to sit down in the shower, use a plastic, non-slip stool.  Keep the floor dry. Clean up any water that spills on the floor as soon as it happens.  Remove soap buildup in the tub or  shower regularly.  Attach bath mats securely with double-sided non-slip rug tape.  Do not have throw rugs and other things on the floor that can make you trip. What can I do in the bedroom?  Use night lights.  Make sure that you have a light by your bed that is easy to reach.  Do not use any sheets or blankets that are too big for your bed. They should not hang down onto the floor.  Have a firm chair that has side arms. You can use this for support while you get dressed.  Do not have throw rugs and other things on the floor that can make you trip. What can I do in the kitchen?  Clean up any spills right away.  Avoid walking on wet floors.  Keep items that you use a lot in easy-to-reach places.  If you need to reach something above you, use a strong step stool that has a grab bar.  Keep electrical cords out of the way.  Do not use floor polish or wax that makes floors slippery. If you must use wax, use non-skid floor wax.  Do not have throw rugs and other things on the  floor that can make you trip. What can I do with my stairs?  Do not leave any items on the stairs.  Make sure that there are handrails on both sides of the stairs and use them. Fix handrails that are broken or loose. Make sure that handrails are as long as the stairways.  Check any carpeting to make sure that it is firmly attached to the stairs. Fix any carpet that is loose or worn.  Avoid having throw rugs at the top or bottom of the stairs. If you do have throw rugs, attach them to the floor with carpet tape.  Make sure that you have a light switch at the top of the stairs and the bottom of the stairs. If you do not have them, ask someone to add them for you. What else can I do to help prevent falls?  Wear shoes that:  Do not have high heels.  Have rubber bottoms.  Are comfortable and fit you well.  Are closed at the toe. Do not wear sandals.  If you use a stepladder:  Make sure that it is fully  opened. Do not climb a closed stepladder.  Make sure that both sides of the stepladder are locked into place.  Ask someone to hold it for you, if possible.  Clearly mark and make sure that you can see:  Any grab bars or handrails.  First and last steps.  Where the edge of each step is.  Use tools that help you move around (mobility aids) if they are needed. These include:  Canes.  Walkers.  Scooters.  Crutches.  Turn on the lights when you go into a dark area. Replace any light bulbs as soon as they burn out.  Set up your furniture so you have a clear path. Avoid moving your furniture around.  If any of your floors are uneven, fix them.  If there are any pets around you, be aware of where they are.  Review your medicines with your doctor. Some medicines can make you feel dizzy. This can increase your chance of falling. Ask your doctor what other things that you can do to help prevent falls. This information is not intended to replace advice given to you by your health care provider. Make sure you discuss any questions you have with your health care provider. Document Released: 04/20/2009 Document Revised: 11/30/2015 Document Reviewed: 07/29/2014 Elsevier Interactive Patient Education  2017 [employer].

## 2017-02-26 NOTE — Progress Notes (Signed)
.       Patient: Meredith Russell, Female    DOB: 1951-05-26, 67 y.o.   MRN: [id number] Visit Date: 02/26/2017  Today's Provider: Lelon Huh, MD   Chief Complaint  Patient presents with  . Annual Exam  . Hypertension  . Hypothyroidism  . Insomnia   Subjective:   Patient saw [facility]enzie this morning for AWV.   Annual physical exam Meredith Russell is a 66 y.o. female who presents today for health maintenance and complete physical. She feels fairly well. She reports exercising yes/some walking. She reports she is sleeping poorly.  ----------------------------------------------------------------   Hypertension, follow-up:  BP Readings from Last 3 Encounters:  02/26/17 (!) 152/88  01/30/17 (!) 148/93  01/03/17 140/80    She was last seen for hypertension 1 years ago.  BP at that visit was 148/84. Management since that visit includes; no cahnges.She reports good compliance with treatment. She is not having side effects. none She is exercising. She is adherent to low salt diet.   Outside blood pressures are 130/74. She is experiencing none.  Patient denies none.   Cardiovascular risk factors include none.  Use of agents associated with hypertension: none.   ----------------------------------------------------------------  She continues to be followed by dr. Jefm Bryant every six months for RA and had normal CBC and met C In June.  She continues to have difficulty sleepy and requires zolpidem every night.     Review of Systems  Constitutional: Positive for fatigue.  HENT: Positive for congestion, sinus pressure and sneezing.   Eyes: Negative.   Respiratory: Negative.   Cardiovascular: Negative.   Gastrointestinal: Negative.   Endocrine: Positive for cold intolerance and polyuria.  Genitourinary: Positive for difficulty urinating and urgency.  Musculoskeletal: Positive for arthralgias, back pain and neck pain.  Skin: Negative.   Allergic/Immunologic: Negative.     Neurological: Positive for dizziness.  Hematological: Negative.   Psychiatric/Behavioral: Negative.     Social History      She  reports that she quit smoking about 29 years ago. She has never used smokeless tobacco. She reports that she drinks alcohol. She reports that she does not use drugs.       Social History   Social History  . Marital status: Married    Spouse name: Shanon Brow  . Number of children: 2  . Years of education: N/A   Social History Main Topics  . Smoking status: Former Smoker    Quit date: 11/22/1987  . Smokeless tobacco: Never Used  . Alcohol use Yes     Comment: occasional; drinks vodka and whiskey  . Drug use: No  . Sexual activity: Not Asked   Other Topics Concern  . None   Social History Narrative  . None    History reviewed. No pertinent past medical history.   Patient Active Problem List   Diagnosis Date Noted  . Cerebrovascular disease 05/10/2016  . Allergic state 01/23/2015  . Adaptation reaction 11/22/2014  . Cannot sleep 11/22/2014  . Subclinical hypothyroidism 11/22/2014  . Cervical radiculitis 01/10/2014  . Cervical osteoarthritis 01/10/2014  . Carpal tunnel syndrome 01/10/2014  . DDD (degenerative disc disease), cervical 01/10/2014  . BP (high blood pressure) 02/08/2010  . Chronic infection of sinus 06/26/2009  . Allergic rhinitis 11/21/2008  . H/O total hysterectomy 11/18/2008  . History of tobacco use 11/18/2008  . Absolute anemia 11/15/2008  . Narrowing of intervertebral disc space 11/15/2008  . Migraine without aura and responsive to treatment 11/15/2008  . Arthritis, degenerative  11/15/2008  . Arthritis or polyarthritis, rheumatoid ([place]) 11/15/2008  . Osteopenia 12/17/2006    Past Surgical History:  Procedure Laterality Date  . ABDOMINAL HYSTERECTOMY    . BACK SURGERY    . BREAST BIOPSY Right    neg  . BUNIONECTOMY    . CARPAL TUNNEL RELEASE Bilateral 09/2013  . EYE SURGERY    . HEMORROIDECTOMY    . JOINT  REPLACEMENT      Family History        Family Status  Relation Status  . Mother Deceased  . Father Deceased  . Other (Not Specified)        Her family history includes Cancer in her father; Diabetes in her other; Heart attack in her mother; Heart disease in her father, mother, and other; Hyperlipidemia in her other; Hypertension in her other.     Allergies  Allergen Reactions  . Lyrica  [Pregabalin]   . Morphine And Related     Fast heart beat  . Sulfa Antibiotics   . Savella  [Milnacipran] Rash     Current Outpatient Prescriptions:  .  amLODipine (NORVASC) 2.5 MG tablet, Take 1 tablet by mouth daily., Disp: , Rfl:  .  aspirin 81 MG tablet, LOW-DOSE ASPIRIN, 81MG (Oral Tablet)  1 Every Day for 0 days  Quantity: 0.00;  Refills: 0   Ordered :20-Sep-2010  Ashley Royalty ;  Started 21-Nov-2008 Active Comments: DX: 627.2, Disp: , Rfl:  .  Biotin 5 MG TABS, Take 5,000 each by mouth daily., Disp: , Rfl:  .  Biotin w/ Vitamins C & E (HAIR/SKIN/NAILS PO), Take by mouth daily., Disp: , Rfl:  .  Cholecalciferol (VITAMIN D3) 1000 units CAPS, Take by mouth daily., Disp: , Rfl:  .  diclofenac (VOLTAREN) 50 MG EC tablet, Take 50 mg by mouth 2 (two) times daily., Disp: , Rfl:  .  diclofenac sodium (VOLTAREN) 1 % GEL, Apply topically., Disp: , Rfl:  .  fluticasone (FLONASE) 50 MCG/ACT nasal spray, Place into the nose., Disp: , Rfl:  .  HUMIRA PEN 40 MG/0.8ML PNKT, Inject 0.8 mLs as directed every 14 (fourteen) days., Disp: , Rfl:  .  leflunomide (ARAVA) 20 MG tablet, Take 20 mg by mouth daily. , Disp: , Rfl:  .  Loratadine-Pseudoephedrine (CLARITIN-D 24 HOUR PO), Take 1 tablet by mouth daily as needed., Disp: , Rfl:  .  meclizine (ANTIVERT) 12.5 MG tablet, Take 12.5 mg by mouth as needed. , Disp: , Rfl:  .  OMEGA-3 FATTY ACIDS PO, OMEGA-3 FATTY ACIDS, 1200MG (Oral Capsule)  for 0 days  Quantity: 0.00;  Refills: 0   Ordered :20-Sep-2010  Ashley Royalty ;  Started 21-Nov-2008 Active Comments: DX: 714.0,  Disp: , Rfl:  .  ondansetron (ZOFRAN) 4 MG tablet, Take 4 mg by mouth every 8 (eight) hours as needed. , Disp: , Rfl:  .  oxyCODONE-acetaminophen (PERCOCET) 7.5-325 MG per tablet, Take 1 tablet by mouth every 8 (eight) hours as needed. , Disp: , Rfl:  .  tiZANidine (ZANAFLEX) 2 MG tablet, Take 2 mg by mouth every 6 (six) hours as needed for muscle spasms., Disp: , Rfl:  .  vitamin C (ASCORBIC ACID) 500 MG tablet, VITAMIN C, 500MG (Oral Tablet)  1 Every Day for 0 days  Quantity: 0.00;  Refills: 0   Ordered :28-Jul-2014  Abran Richard ;  Started 15-Nov-2008 Active Comments: DX: 627.2, Disp: , Rfl:  .  zolpidem (AMBIEN) 10 MG tablet, Take 1 tablet (10 mg total) by  mouth at bedtime., Disp: 30 tablet, Rfl: 4 .  Meth-Hyo-M Bl-Na Phos-Ph Sal (URIBEL) 118 MG CAPS, Take up to 4 capsules by mouth daily with a large glass of water, Disp: , Rfl: 3   Patient Care Team: Birdie Sons, MD as PCP - General (Family Medicine) Emmaline Kluver., MD (Rheumatology) Nickie Retort, MD as Consulting Physician (Urology) Samara Deist, DPM as Referring Physician (Podiatry)      Objective:   Vital Signs - Last Recorded  Most recent update: 02/26/2017 9:02 AM by Fabio Neighbors, LPN  BP    962/83 (BP Location: Left Arm)     Pulse  76     Temp  98.3 F (36.8 C) (Oral)     Ht  5' 3"  (1.6 m)     Wt  137 lb 9.6 oz (62.4 kg)      BMI  24.37 kg/m      Vitals History  BMI and BSA Data   . Vitals:   02/26/17 1022  BP: (!) 144/88    Body Mass Index: 24.37 kg/m Body Surface Area: 1.67 m       Physical Exam   General Appearance:    Alert, cooperative, no distress, appears stated age  Head:    Normocephalic, without obvious abnormality, atraumatic  Eyes:    PERRL, conjunctiva/corneas clear, EOM's intact, fundi    benign, both eyes  Ears:    Normal TM's and external ear canals, both ears  Nose:   Nares normal, septum midline, mucosa normal, no drainage    or sinus tenderness   Throat:   Lips, mucosa, and tongue normal; teeth and gums normal  Neck:   Supple, symmetrical, trachea midline, no adenopathy;    thyroid:  no enlargement/tenderness/nodules; no carotid   bruit or JVD  Back:     Symmetric, no curvature, ROM normal, no CVA tenderness  Lungs:     Clear to auscultation bilaterally, respirations unlabored  Chest Wall:    No tenderness or deformity   Heart:    Regular rate and rhythm, S1 and S2 normal, no murmur, rub   or gallop  Breast Exam:    normal appearance, no masses or tenderness  Abdomen:     Soft, non-tender, bowel sounds active all four quadrants,    no masses, no organomegaly  Pelvic:    deferred  Extremities:   Extremities normal, atraumatic, no cyanosis or edema  Pulses:   2+ and symmetric all extremities  Skin:   Skin color, texture, turgor normal, no rashes or lesions  Lymph nodes:   Cervical, supraclavicular, and axillary nodes normal  Neurologic:   CNII-XII intact, normal strength, sensation and reflexes    throughout    Depression Screen PHQ 2/9 Scores 02/26/2017 02/26/2017 02/22/2016 01/24/2016  PHQ - 2 Score 0 0 0 0  PHQ- 9 Score 5 - - -      Assessment & Plan:     Routine Health Maintenance and Physical Exam  Exercise Activities and Dietary recommendations Goals    . Increase water intake          Recommend increasing water intake to 6-8 glasses a day.        Immunization History  Administered Date(s) Administered  . PPD Test 09/11/2015  . Pneumococcal Conjugate-13 01/23/2015  . Pneumococcal Polysaccharide-23 02/22/2016  . Td 02/26/2017  . Tdap 10/07/2006  . Zoster 07/06/2012    Health Maintenance  Topic Date Due  . INFLUENZA VACCINE  02/05/2017  .  MAMMOGRAM  04/04/2018  . COLONOSCOPY  03/05/2022  . TETANUS/TDAP  02/27/2027  . DEXA SCAN  Completed  . Hepatitis C Screening  Completed  . PNA vac Low Risk Adult  Completed     Discussed health benefits of physical activity, and encouraged her to engage in  regular exercise appropriate for her age and condition.    --------------------------------------------------------------------   1. Annual physical exam   2. Need for influenza vaccination  - Flu vaccine HIGH DOSE PF  3. Essential hypertension She reports home BP mostly in the 130's/80s. Continue current dose of amlodipine which is prescribed from [facility].   4. Osteopenia, unspecified location   5. DDD (degenerative disc disease), cervical Pain medications managed by Dr. Jefm Bryant.   6. Insomnia, unspecified type Continue current dose of zolpidem.   7. Estrogen deficiency  - DG Bone Density; Future   Lelon Huh, MD  [facility]

## 2017-02-27 ENCOUNTER — Ambulatory Visit (INDEPENDENT_AMBULATORY_CARE_PROVIDER_SITE_OTHER): Payer: Medicare Other | Admitting: Urology

## 2017-02-27 ENCOUNTER — Encounter: Payer: Self-pay | Admitting: Urology

## 2017-02-27 VITALS — BP 166/79 | HR 80 | Ht 63.0 in | Wt 135.5 lb

## 2017-02-27 DIAGNOSIS — R300 Dysuria: Secondary | ICD-10-CM | POA: Diagnosis not present

## 2017-02-27 DIAGNOSIS — N3281 Overactive bladder: Secondary | ICD-10-CM

## 2017-02-27 LAB — URINALYSIS, COMPLETE
Bilirubin, UA: NEGATIVE
Glucose, UA: NEGATIVE
KETONES UA: NEGATIVE
LEUKOCYTES UA: NEGATIVE
NITRITE UA: NEGATIVE
PH UA: 6.5 (ref 5.0–7.5)
Protein, UA: NEGATIVE
RBC, UA: NEGATIVE
SPEC GRAV UA: 1.01 (ref 1.005–1.030)
Urobilinogen, Ur: 0.2 mg/dL (ref 0.2–1.0)

## 2017-02-27 LAB — MICROSCOPIC EXAMINATION
BACTERIA UA: NONE SEEN
RBC MICROSCOPIC, UA: NONE SEEN /HPF (ref 0–?)
WBC, UA: NONE SEEN /hpf (ref 0–?)

## 2017-02-27 MED ORDER — LIDOCAINE HCL 2 % EX GEL
1.0000 | Freq: Once | CUTANEOUS | Status: AC
Start: 2017-02-27 — End: 2017-02-27
  Administered 2017-02-27: 1 via URETHRAL

## 2017-02-27 MED ORDER — SOLIFENACIN SUCCINATE 5 MG PO TABS: 5.0000 mg | ORAL_TABLET | Freq: Every day | ORAL | 11 refills | Status: DC

## 2017-02-27 MED ORDER — CIPROFLOXACIN HCL 500 MG PO TABS
500.0000 mg | ORAL_TABLET | Freq: Once | ORAL | Status: AC
  Administered 2017-02-27: 500 mg via ORAL

## 2017-02-27 NOTE — Progress Notes (Signed)
   02/27/17  CC:  Chief Complaint  Patient presents with  . Cysto    HPI: The patient is a 66 year old female presents today for dysuria. She has had 2 negative urine cultures treated with antibiotics with no resolution in symptoms.  This has been only going on for 1 month. She does not have symptoms prior to this. She is most concerned by dysuria particularly after voiding. She also feels pressure in the suprapubic region after voiding as well. She does note severe new onset urgency Though she has not had episodes of  incontinence, she has been close to it. She finds is very bothersome especially since she did not have this problem before. Her last documented urine tract infection was approximately 40 years ago. She does have a very distant smoking history. She had mild relief on Azo.  Random bladder scan revealed 158 cc.  She was started on Myrbetriq and Uribel with some relief. Her biggest complaint now is the urge to the bathroom where she still has small volume voids. She also feels this pressure which she describes also has an urge to urinate with even though she recently voided. Her dysuria has improved dramatically. Her biggest complaint at this time is this urgency. She feels them or Saralyn Pilar helped slightly with this though she finds it quite expensive, and she wishes her symptoms would improve more.  Blood pressure (!) 166/79, pulse 80, height 5\' 3"  (1.6 m), weight 135 lb 8 oz (61.5 kg). NED. A&Ox3.   No respiratory distress   Abd soft, NT, ND Normal external genitalia with patent urethral meatus  Cystoscopy Procedure Note  Patient identification was confirmed, informed consent was obtained, and patient was prepped using Betadine solution.  Lidocaine jelly was administered per urethral meatus.    Preoperative abx where received prior to procedure.    Procedure: - Flexible cystoscope introduced, without any difficulty.   - Thorough search of the bladder revealed:    normal  urethral meatus    normal urothelium    no stones    no ulcers     no tumors    no urethral polyps    no trabeculation  - Ureteral orifices were normal in position and appearance.  Post-Procedure: - Patient tolerated the procedure well  Assessment/ Plan:  1. Dysuria 2. OAB I discussed the patient is a cystoscopy was negative today. We'll send her urine for cytology due to her refractory symptoms. I have switched her to Vesicare 5 mg daily from Myrbetriq mainly due to cost. Though this may improve her urgency symptoms remain as well. We'll switch her to another anticholinergic if this is too expensive. She will continue Uribel. She was encouraged to increase her fluid intake. She'll follow-up in 6 weeks to assess her progress.   Nickie Retort, MD

## 2017-03-05 ENCOUNTER — Other Ambulatory Visit: Payer: Self-pay | Admitting: Urology

## 2017-03-11 ENCOUNTER — Other Ambulatory Visit: Payer: Self-pay | Admitting: Family Medicine

## 2017-03-11 ENCOUNTER — Telehealth: Payer: Self-pay

## 2017-03-11 DIAGNOSIS — Z1231 Encounter for screening mammogram for malignant neoplasm of breast: Secondary | ICD-10-CM

## 2017-03-11 NOTE — Telephone Encounter (Signed)
Nickie Retort, MD  Toniann Fail C, LPN        Please let patient know her urine cytology test was normal. Follow up as previously scheduled.   Spoke with pt in reference to cytology results. Pt voiced understanding.

## 2017-04-06 ENCOUNTER — Other Ambulatory Visit: Payer: Self-pay | Admitting: Family Medicine

## 2017-04-17 ENCOUNTER — Other Ambulatory Visit: Payer: Medicare Other

## 2017-04-23 ENCOUNTER — Ambulatory Visit
Admission: RE | Admit: 2017-04-23 | Discharge: 2017-04-23 | Disposition: A | Discharge status: Home / Self Care | Payer: Medicare Other | Source: Ambulatory Visit | Attending: Family Medicine | Admitting: Family Medicine

## 2017-04-23 DIAGNOSIS — Z13820 Encounter for screening for osteoporosis: Secondary | ICD-10-CM | POA: Insufficient documentation

## 2017-04-23 DIAGNOSIS — E2839 Other primary ovarian failure: Secondary | ICD-10-CM | POA: Insufficient documentation

## 2017-04-23 DIAGNOSIS — M8580 Other specified disorders of bone density and structure, unspecified site: Secondary | ICD-10-CM | POA: Diagnosis not present

## 2017-04-23 DIAGNOSIS — Z1231 Encounter for screening mammogram for malignant neoplasm of breast: Secondary | ICD-10-CM | POA: Diagnosis present

## 2017-08-12 ENCOUNTER — Encounter: Payer: Self-pay | Admitting: Family Medicine

## 2017-08-12 ENCOUNTER — Telehealth: Payer: Self-pay | Admitting: Family Medicine

## 2017-08-12 NOTE — Telephone Encounter (Signed)
Please advise patient that we received letter from insurance that they will only cover 90 tablets of Ambien per year (about two a week) if she needs to take something more often she will need to make an appointment to discuss alternatives.

## 2017-08-12 NOTE — Telephone Encounter (Signed)
Patient wants to stick with Ambien because she knows that this works. She reports that she has tried other sleep aids in the past, such as Belsomra , Melatonin, and another one but can not remember the name with no success. The patient is willing to pay out of pocket for the remainder of the pills.

## 2017-09-11 ENCOUNTER — Ambulatory Visit (INDEPENDENT_AMBULATORY_CARE_PROVIDER_SITE_OTHER): Payer: Medicare Other | Admitting: Family Medicine

## 2017-09-11 ENCOUNTER — Encounter: Payer: Self-pay | Admitting: Family Medicine

## 2017-09-11 VITALS — BP 162/98 | HR 65 | Temp 97.9°F | Resp 16 | Wt 142.0 lb

## 2017-09-11 DIAGNOSIS — I10 Essential (primary) hypertension: Secondary | ICD-10-CM | POA: Diagnosis not present

## 2017-09-11 NOTE — Progress Notes (Signed)
Patient: Meredith Russell Female    DOB: 08/15/1950   67 y.o.   MRN: [id number] Visit Date: 09/11/2017  Today's Provider: Lelon Huh, MD   Chief Complaint  Patient presents with  . Hypertension   Subjective:    HPI   Hypertension, follow-up:  BP Readings from Last 3 Encounters:  02/27/17 (!) 166/79  02/26/17 (!) 144/88  02/26/17 (!) 152/88    She was last seen for hypertension 7 months ago.  BP at that visit was 144/88. Management since that visit includes; no changes.She reports good compliance with treatment.  She is not having side effects.  She is exercising. She is adherent to low salt diet.   Outside blood pressures are elevated.   She reports she was seen by her Rheumatologist on 3/4 and that her blood pressure was elevated at 184/82. 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 She states she just went up the to this dose last night as she usually takes medication in the evening. Feels well today.  She is experiencing lower extremity edema.  Patient denies chest pain, chest pressure/discomfort, claudication, dyspnea, exertional chest pressure/discomfort, fatigue, irregular heart beat, near-syncope, orthopnea, palpitations, paroxysmal nocturnal dyspnea, syncope and tachypnea.   Cardiovascular risk factors include advanced age (older than 68 for men, 105 for women) and hypertension.  Use of agents associated with hypertension: NSAIDS.   ------------------------------------------------------------------------    Allergies  Allergen Reactions  . Lyrica  [Pregabalin]   . Morphine And Related     Fast heart beat  . Sulfa Antibiotics   . Savella  [Milnacipran] Rash     Current Outpatient Medications:  .  amLODipine (NORVASC) 2.5 MG tablet, Take 2 tablets by mouth daily. , Disp: , Rfl:  .  aspirin 81 MG tablet, LOW-DOSE ASPIRIN, 81MG  (Oral Tablet)  1 Every Day for 0 days  Quantity: 0.00;  Refills: 0    Ordered :20-Sep-2010  Ashley Royalty ;  Started 21-Nov-2008 Active Comments: DX: 627.2, Disp: , Rfl:  .  Biotin 5 MG TABS, Take 5,000 each by mouth daily., Disp: , Rfl:  .  Cholecalciferol (VITAMIN D3) 1000 units CAPS, Take by mouth daily., Disp: , Rfl:  .  CLARITIN-D 24 HOUR 10-240 MG 24 hr tablet, TAKE 1 TABLET BY MOUTH DAILY AS NEEDED, Disp: 30 tablet, Rfl: 2 .  diclofenac (VOLTAREN) 50 MG EC tablet, Take 50 mg by mouth 2 (two) times daily., Disp: , Rfl:  .  diclofenac sodium (VOLTAREN) 1 % GEL, Apply topically., Disp: , Rfl:  .  fluticasone (FLONASE) 50 MCG/ACT nasal spray, Place 2 sprays into both nostrils daily., Disp: 48 g, Rfl: 3 .  halobetasol (ULTRAVATE) 0.05 % ointment, , Disp: , Rfl:  .  HUMIRA PEN 40 MG/0.8ML PNKT, Inject 0.8 mLs as directed every 14 (fourteen) days., Disp: , Rfl:  .  leflunomide (ARAVA) 20 MG tablet, Take 20 mg by mouth daily. , Disp: , Rfl:  .  meclizine (ANTIVERT) 12.5 MG tablet, Take 12.5 mg by mouth as needed. , Disp: , Rfl:  .  Meth-Hyo-M Bl-Na Phos-Ph Sal (URIBEL) 118 MG CAPS, Take up to 4 capsules by mouth daily with a large glass of water, Disp: , Rfl: 3 .  OMEGA-3 FATTY ACIDS PO, OMEGA-3 FATTY ACIDS, 1200MG  (Oral Capsule)  for 0 days  Quantity: 0.00;  Refills: 0   Ordered :20-Sep-2010  Ashley Royalty ;  Started 21-Nov-2008 Active  Comments: DX: 714.0, Disp: , Rfl:  .  ondansetron (ZOFRAN) 4 MG tablet, Take 4 mg by mouth every 8 (eight) hours as needed. , Disp: , Rfl:  .  oxyCODONE-acetaminophen (PERCOCET) 7.5-325 MG per tablet, Take 1 tablet by mouth every 8 (eight) hours as needed. , Disp: , Rfl:  .  solifenacin (VESICARE) 5 MG tablet, Take 1 tablet (5 mg total) by mouth daily., Disp: 30 tablet, Rfl: 11 .  tiZANidine (ZANAFLEX) 2 MG tablet, Take 2 mg by mouth every 6 (six) hours as needed for muscle spasms., Disp: , Rfl:  .  triamcinolone cream (KENALOG) 0.5 %, APPLY TO AFFECTED AREA TWICE A DAY, Disp: , Rfl: 0 .  vitamin C (ASCORBIC ACID) 500 MG tablet, VITAMIN  C, 500MG  (Oral Tablet)  1 Every Day for 0 days  Quantity: 0.00;  Refills: 0   Ordered :28-Jul-2014  Abran Richard ;  Started 15-Nov-2008 Active Comments: DX: 627.2, Disp: , Rfl:  .  zolpidem (AMBIEN) 10 MG tablet, Take 1 tablet (10 mg total) by mouth at bedtime., Disp: 30 tablet, Rfl: 4  Review of Systems  Constitutional: Negative for appetite change, chills, fatigue and fever.  Eyes: Positive for visual disturbance (blurred vision).  Respiratory: Negative for chest tightness and shortness of breath.   Cardiovascular: Positive for leg swelling (in feet). Negative for chest pain and palpitations.  Gastrointestinal: Negative for abdominal pain, nausea and vomiting.  Neurological: Positive for headaches. Negative for dizziness and weakness.    Social History   Tobacco Use  . Smoking status: Former Smoker    Last attempt to quit: 11/22/1987    Years since quitting: 29.8  . Smokeless tobacco: Never Used  Substance Use Topics  . Alcohol use: Yes    Comment: occasional; drinks vodka and whiskey   Objective:   BP (!) 162/98 (BP Location: Right Arm, Cuff Size: Normal)   Pulse 65   Temp 97.9 F (36.6 C) (Oral)   Resp 16   Wt 142 lb (64.4 kg)   SpO2 96% Comment: room air  BMI 25.15 kg/m  Vitals:   09/11/17 1001 09/11/17 1004  BP: (!) 172/94 (!) 162/98  Pulse: 65   Resp: 16   Temp: 97.9 F (36.6 C)   TempSrc: Oral   SpO2: 96%   Weight: 142 lb (64.4 kg)      Physical Exam   General Appearance:    Alert, cooperative, no distress  Eyes:    PERRL, conjunctiva/corneas clear, EOM's intact       Lungs:     Clear to auscultation bilaterally, respirations unlabored  Heart:    Regular rate and rhythm  Neurologic:   Awake, alert, oriented x 3. No apparent focal neurological           defect.           Assessment & Plan:     1. Essential hypertension Is on first day of increased dose of amlodipine.. Will check labs today. Return in 1 week to check BP. Call if any adverse effects  from change in medication dose.  - Renal function panel - TSH - Lipid panel       Lelon Huh, MD  [facility]

## 2017-09-12 ENCOUNTER — Encounter: Payer: Self-pay | Admitting: Family Medicine

## 2017-09-12 LAB — LIPID PANEL
CHOL/HDL RATIO: 2.1 ratio (ref 0.0–4.4)
Cholesterol, Total: 217 mg/dL — ABNORMAL HIGH (ref 100–199)
HDL: 101 mg/dL (ref >39)
LDL Calculated: 101 mg/dL — ABNORMAL HIGH (ref 0–99)
Triglycerides: 74 mg/dL (ref 0–149)
VLDL Cholesterol Cal: 15 mg/dL (ref 5–40)

## 2017-09-12 LAB — TSH: TSH: 4.52 u[IU]/mL — ABNORMAL HIGH (ref 0.450–4.500)

## 2017-09-12 LAB — RENAL FUNCTION PANEL
ALBUMIN: 4.5 g/dL (ref 3.6–4.8)
BUN/Creatinine Ratio: 19 (ref 12–28)
BUN: 16 mg/dL (ref 8–27)
CALCIUM: 9.5 mg/dL (ref 8.7–10.3)
CHLORIDE: 103 mmol/L (ref 96–106)
CO2: 23 mmol/L (ref 20–29)
Creatinine, Ser: 0.84 mg/dL (ref 0.57–1.00)
GFR calc Af Amer: 83 mL/min/{1.73_m2} (ref >59)
GFR calc non Af Amer: 72 mL/min/{1.73_m2} (ref >59)
Glucose: 94 mg/dL (ref 65–99)
PHOSPHORUS: 3.4 mg/dL (ref 2.5–4.5)
Potassium: 4.4 mmol/L (ref 3.5–5.2)
Sodium: 141 mmol/L (ref 134–144)

## 2017-09-18 ENCOUNTER — Other Ambulatory Visit: Payer: Self-pay | Admitting: Family Medicine

## 2017-09-18 ENCOUNTER — Encounter: Payer: Self-pay | Admitting: Family Medicine

## 2017-09-18 ENCOUNTER — Ambulatory Visit (INDEPENDENT_AMBULATORY_CARE_PROVIDER_SITE_OTHER): Payer: Medicare Other | Admitting: Family Medicine

## 2017-09-18 VITALS — BP 138/82 | HR 68 | Temp 98.6°F | Resp 16

## 2017-09-18 DIAGNOSIS — I10 Essential (primary) hypertension: Secondary | ICD-10-CM

## 2017-09-18 MED ORDER — ZOLPIDEM TARTRATE 10 MG PO TABS: 10.0000 mg | ORAL_TABLET | Freq: Every day | ORAL | 4 refills | Status: DC

## 2017-09-18 NOTE — Telephone Encounter (Signed)
Pt contacted office for refill request on the following medications:  zolpidem (AMBIEN) 10 MG tablet  [facility]  Last Rx: 02/26/17 with 4 refills LOV: 09/11/17  Pt stated that her insurance has given her a time about covering this medication because she is over 67 years old. Pt stated that she has decided to pay for the medication out of pocket and is requesting that the Rx be sent to [facility]. Please advise. Thanks TNP

## 2017-09-18 NOTE — Progress Notes (Signed)
Patient: Meredith Russell Female    DOB: Mar 26, 1951   67 y.o.   MRN: [id number] Visit Date: 09/18/2017  Today's Provider: Lelon Huh, MD   Chief Complaint  Patient presents with  . Hypertension   Subjective:    HPI  Hypertension, follow-up:  BP Readings from Last 3 Encounters:  09/18/17 138/82  09/11/17 (!) 162/98  02/27/17 (!) 166/79    She was last seen for hypertension 1 weeks ago.  BP at that visit was 162/98. Management since that visit includes increasing Amlodipine to twice daily. She reports good compliance with treatment. She is not having side effects.  She is exercising. She is adherent to low salt diet.   Outside blood pressures are checked occasionally. She is experiencing none.  Patient denies fatigue, lower extremity edema and palpitations.      Weight trend: stable Wt Readings from Last 3 Encounters:  09/11/17 142 lb (64.4 kg)  02/27/17 135 lb 8 oz (61.5 kg)  02/26/17 137 lb 9.6 oz (62.4 kg)    Current diet: well balanced      Allergies  Allergen Reactions  . Lyrica  [Pregabalin]   . Morphine And Related     Fast heart beat  . Sulfa Antibiotics   . Savella  [Milnacipran] Rash     Current Outpatient Medications:  .  amLODipine (NORVASC) 2.5 MG tablet, Take 2 tablets by mouth daily. , Disp: , Rfl:  .  aspirin 81 MG tablet, LOW-DOSE ASPIRIN, 81MG  (Oral Tablet)  1 Every Day for 0 days  Quantity: 0.00;  Refills: 0   Ordered :20-Sep-2010  Ashley Royalty ;  Started 21-Nov-2008 Active Comments: DX: 627.2, Disp: , Rfl:  .  Biotin 5 MG TABS, Take 5,000 each by mouth daily., Disp: , Rfl:  .  Cholecalciferol (VITAMIN D3) 1000 units CAPS, Take by mouth daily., Disp: , Rfl:  .  CLARITIN-D 24 HOUR 10-240 MG 24 hr tablet, TAKE 1 TABLET BY MOUTH DAILY AS NEEDED, Disp: 30 tablet, Rfl: 2 .  diclofenac sodium (VOLTAREN) 1 % GEL, Apply topically., Disp: , Rfl:  .  fluticasone (FLONASE) 50 MCG/ACT nasal spray, Place 2 sprays into both nostrils daily.,  Disp: 48 g, Rfl: 3 .  halobetasol (ULTRAVATE) 0.05 % ointment, , Disp: , Rfl:  .  HUMIRA PEN 40 MG/0.8ML PNKT, Inject 0.8 mLs as directed every 14 (fourteen) days., Disp: , Rfl:  .  leflunomide (ARAVA) 20 MG tablet, Take 20 mg by mouth daily. , Disp: , Rfl:  .  meclizine (ANTIVERT) 12.5 MG tablet, Take 12.5 mg by mouth as needed. , Disp: , Rfl:  .  Meth-Hyo-M Bl-Na Phos-Ph Sal (URIBEL) 118 MG CAPS, Take up to 4 capsules by mouth daily with a large glass of water, Disp: , Rfl: 3 .  OMEGA-3 FATTY ACIDS PO, OMEGA-3 FATTY ACIDS, 1200MG  (Oral Capsule)  for 0 days  Quantity: 0.00;  Refills: 0   Ordered :20-Sep-2010  Ashley Royalty ;  Started 21-Nov-2008 Active Comments: DX: 714.0, Disp: , Rfl:  .  ondansetron (ZOFRAN) 4 MG tablet, Take 4 mg by mouth every 8 (eight) hours as needed. , Disp: , Rfl:  .  oxyCODONE-acetaminophen (PERCOCET) 7.5-325 MG per tablet, Take 1 tablet by mouth every 8 (eight) hours as needed. , Disp: , Rfl:  .  solifenacin (VESICARE) 5 MG tablet, Take 1 tablet (5 mg total) by mouth daily., Disp: 30 tablet, Rfl: 11 .  tiZANidine (ZANAFLEX) 2 MG tablet, Take 2 mg  by mouth every 6 (six) hours as needed for muscle spasms., Disp: , Rfl:  .  triamcinolone cream (KENALOG) 0.5 %, APPLY TO AFFECTED AREA TWICE A DAY, Disp: , Rfl: 0 .  vitamin C (ASCORBIC ACID) 500 MG tablet, VITAMIN C, 500MG  (Oral Tablet)  1 Every Day for 0 days  Quantity: 0.00;  Refills: 0   Ordered :28-Jul-2014  Abran Richard ;  Started 15-Nov-2008 Active Comments: DX: 627.2, Disp: , Rfl:  .  zolpidem (AMBIEN) 10 MG tablet, Take 1 tablet (10 mg total) by mouth at bedtime., Disp: 30 tablet, Rfl: 4 .  diclofenac (VOLTAREN) 50 MG EC tablet, Take 50 mg by mouth 2 (two) times daily., Disp: , Rfl:   Review of Systems  Constitutional: Negative.   Respiratory: Negative.   Cardiovascular: Negative for chest pain, palpitations and leg swelling.  Musculoskeletal: Negative.   Allergic/Immunologic: Negative.   Neurological: Negative.      Social History   Tobacco Use  . Smoking status: Former Smoker    Last attempt to quit: 11/22/1987    Years since quitting: 29.8  . Smokeless tobacco: Never Used  Substance Use Topics  . Alcohol use: Yes    Comment: occasional; drinks vodka and whiskey   Objective:   BP 138/82 (BP Location: Left Arm, Patient Position: Sitting, Cuff Size: Normal)   Pulse 68   Temp 98.6 F (37 C)   Resp 16   SpO2 98%  Vitals:   09/18/17 0809  BP: 138/82  Pulse: 68  Resp: 16  Temp: 98.6 F (37 C)  SpO2: 98%     Physical Exam  General appearance: alert, well developed, well nourished, cooperative and in no distress Head: Normocephalic, without obvious abnormality, atraumatic Respiratory: Respirations even and unlabored, normal respiratory rate Extremities: No edema     Assessment & Plan:     1. Essential hypertension Much better with increased dose of amlodipine. Continue 2x2.5mg . Her mail order just shipped 90 2.5mg  tablets, so will send in new prescription for 5mg  tablets to [facility] in about a month.   Follow up for annual wellness and CPE in August.       Lelon Huh, MD  [facility]

## 2017-10-30 ENCOUNTER — Other Ambulatory Visit: Payer: Self-pay | Admitting: Family Medicine

## 2017-10-30 MED ORDER — AMLODIPINE BESYLATE 5 MG PO TABS
5.0000 mg | ORAL_TABLET | Freq: Every day | ORAL | 3 refills | Status: DC
Start: 2017-10-30 — End: 2018-10-27

## 2018-02-27 ENCOUNTER — Ambulatory Visit (INDEPENDENT_AMBULATORY_CARE_PROVIDER_SITE_OTHER): Payer: Medicare Other | Admitting: Family Medicine

## 2018-02-27 ENCOUNTER — Ambulatory Visit (INDEPENDENT_AMBULATORY_CARE_PROVIDER_SITE_OTHER): Payer: Medicare Other

## 2018-02-27 VITALS — BP 138/80 | HR 68 | Temp 98.1°F | Ht 63.0 in | Wt 141.4 lb

## 2018-02-27 DIAGNOSIS — G4700 Insomnia, unspecified: Secondary | ICD-10-CM

## 2018-02-27 DIAGNOSIS — L659 Nonscarring hair loss, unspecified: Secondary | ICD-10-CM | POA: Diagnosis not present

## 2018-02-27 DIAGNOSIS — I10 Essential (primary) hypertension: Secondary | ICD-10-CM

## 2018-02-27 DIAGNOSIS — Z0000 Encounter for general adult medical examination without abnormal findings: Secondary | ICD-10-CM

## 2018-02-27 NOTE — Progress Notes (Signed)
Patient: Meredith Russell, Female    DOB: 01/24/1951, 67 y.o.   MRN: [id number] Visit Date: 02/27/2018  Today's Provider: Lelon Huh, MD   Chief Complaint  Patient presents with  . Annual Exam  . Hypertension  . Insomnia   Subjective:    Patient saw McKenzie for AWV today at 9:20 am.   Complete Physical Meredith Russell is a 67 y.o. female. She feels well. She reports exercising daily. She reports she is sleeping poorly.  -----------------------------------------------------------  Hypertension, follow-up:  BP Readings from Last 3 Encounters:  02/27/18 138/80  09/18/17 138/82  09/11/17 (!) 162/98    She was last seen for hypertension 5 months ago.  BP at that visit was 138/82. Management since that visit includes; no changes.She reports good compliance with treatment. Has stopped taking Claritin D.  She is exercising. She is adherent to low salt diet.   Outside blood pressures are averaging 120/70 to 130/80. She is experiencing none.  Patient denies chest pain, chest pressure/discomfort, claudication, dyspnea, exertional chest pressure/discomfort, fatigue, irregular heart beat, lower extremity edema, near-syncope, orthopnea, palpitations, paroxysmal nocturnal dyspnea, syncope and tachypnea.    ------------------------------------------------------------------------   DDD (degenerative disc disease), cervical From 02/26/2017-Pain medications managed by Dr. Jefm Bryant. Patient reports good compliance with treatment. Has been taking off of Humira and just started on Cocentyx  Insomnia, unspecified type From 02/26/2017-no changes. Continue current dose of zolpidem. Patient reports this problem has worsened. She states she sleeps an average of 5 hours each night. She states the Ambien helps, but most of the time she only takes 1/2 to 3/4 a pill to stretch the medication out.   Estrogen deficiency From 02/26/2017-Bone Density ordered.    Her only complains today is  that her hair has been falling out in clumps since stopping Humira. She did have labs in March showing mildly elevated tsh.  Results for orders placed or performed in visit on 09/11/17  Renal function panel  Result Value Ref Range   Glucose 94 65 - 99 mg/dL   BUN 16 8 - 27 mg/dL   Creatinine, Ser 0.84 0.57 - 1.00 mg/dL   GFR calc non Af Amer 72 >59 mL/min/1.73   GFR calc Af Amer 83 >59 mL/min/1.73   BUN/Creatinine Ratio 19 12 - 28   Sodium 141 134 - 144 mmol/L   Potassium 4.4 3.5 - 5.2 mmol/L   Chloride 103 96 - 106 mmol/L   CO2 23 20 - 29 mmol/L   Calcium 9.5 8.7 - 10.3 mg/dL   Phosphorus 3.4 2.5 - 4.5 mg/dL   Albumin 4.5 3.6 - 4.8 g/dL  TSH  Result Value Ref Range   TSH 4.520 (H) 0.450 - 4.500 uIU/mL  Lipid panel  Result Value Ref Range   Cholesterol, Total 217 (H) 100 - 199 mg/dL   Triglycerides 74 0 - 149 mg/dL   HDL 101 >39 mg/dL   VLDL Cholesterol Cal 15 5 - 40 mg/dL   LDL Calculated 101 (H) 0 - 99 mg/dL   Chol/HDL Ratio 2.1 0.0 - 4.4 ratio      Review of Systems  Constitutional: Negative for chills, fatigue and fever.  HENT: Positive for sinus pressure and tinnitus. Negative for congestion, ear pain, rhinorrhea, sneezing and sore throat.   Eyes: Positive for photophobia and itching. Negative for pain, redness and visual disturbance.  Respiratory: Negative for cough, shortness of breath and wheezing.   Cardiovascular: Negative for chest pain and leg swelling.  Gastrointestinal: Negative for abdominal pain, blood in stool, constipation, diarrhea and nausea.  Endocrine: Positive for cold intolerance. Negative for polydipsia and polyphagia.  Genitourinary: Positive for frequency and vaginal pain (during sex). Negative for dysuria, flank pain, hematuria, pelvic pain, vaginal bleeding and vaginal discharge.  Musculoskeletal: Positive for arthralgias, back pain, myalgias and neck pain. Negative for gait problem and joint swelling.  Skin: Negative for rash.       Hair loss    Neurological: Negative.  Negative for dizziness, tremors, seizures, weakness, light-headedness, numbness and headaches.  Hematological: Negative for adenopathy.  Psychiatric/Behavioral: Negative.  Negative for behavioral problems, confusion and dysphoric mood. The patient is not nervous/anxious and is not hyperactive.     Social History   Socioeconomic History  . Marital status: Married    Spouse name: Shanon Brow  . Number of children: 2  . Years of education: Not on file  . Highest education level: High school graduate  Occupational History  . Not on file  Social Needs  . Financial resource strain: Not hard at all  . Food insecurity:    Worry: Never true    Inability: Never true  . Transportation needs:    Medical: No    Non-medical: No  Tobacco Use  . Smoking status: Former Smoker    Last attempt to quit: 11/22/1987    Years since quitting: 30.2  . Smokeless tobacco: Never Used  Substance and Sexual Activity  . Alcohol use: Yes    Comment: occasional; drinks vodka and whiskey  . Drug use: No  . Sexual activity: Not on file  Lifestyle  . Physical activity:    Days per week: Not on file    Minutes per session: Not on file  . Stress: Rather much  Relationships  . Social connections:    Talks on phone: Not on file    Gets together: Not on file    Attends religious service: Not on file    Active member of club or organization: Not on file    Attends meetings of clubs or organizations: Not on file    Relationship status: Not on file  . Intimate partner violence:    Fear of current or ex partner: Not on file    Emotionally abused: Not on file    Physically abused: Not on file    Forced sexual activity: Not on file  Other Topics Concern  . Not on file  Social History Narrative  . Not on file    Past Medical History:  Diagnosis Date  . Psoriasis   . Rheumatoid arthritis [facility])      Patient Active Problem List   Diagnosis Date Noted  . Cerebrovascular disease  05/10/2016  . Allergic state 01/23/2015  . Adaptation reaction 11/22/2014  . Insomnia 11/22/2014  . Cervical radiculitis 01/10/2014  . Cervical osteoarthritis 01/10/2014  . Carpal tunnel syndrome 01/10/2014  . DDD (degenerative disc disease), cervical 01/10/2014  . BP (high blood pressure) 02/08/2010  . Chronic infection of sinus 06/26/2009  . Allergic rhinitis 11/21/2008  . H/O total hysterectomy 11/18/2008  . History of tobacco use 11/18/2008  . Absolute anemia 11/15/2008  . Narrowing of intervertebral disc space 11/15/2008  . Migraine without aura and responsive to treatment 11/15/2008  . Arthritis, degenerative 11/15/2008  . Arthritis or polyarthritis, rheumatoid ([place]) 11/15/2008  . Osteopenia 12/17/2006    Past Surgical History:  Procedure Laterality Date  . ABDOMINAL HYSTERECTOMY    . BACK SURGERY    . BREAST BIOPSY  Right    neg  . BUNIONECTOMY    . CARPAL TUNNEL RELEASE Bilateral 09/2013  . EYE SURGERY    . HEMORROIDECTOMY    . JOINT REPLACEMENT      Her family history includes Cancer in her father; Diabetes in her other; Heart attack in her mother; Heart disease in her father, mother, and other; Hyperlipidemia in her other; Hypertension in her other. There is no history of Bladder Cancer or Kidney cancer.      Current Outpatient Medications:  .  amLODipine (NORVASC) 5 MG tablet, Take 1 tablet (5 mg total) by mouth daily., Disp: 90 tablet, Rfl: 3 .  aspirin 81 MG tablet, LOW-DOSE ASPIRIN, 81MG  (Oral Tablet)  1 Every Day for 0 days  Quantity: 0.00;  Refills: 0   Ordered :20-Sep-2010  Ashley Royalty ;  Started 21-Nov-2008 Active Comments: DX: 627.2, Disp: , Rfl:  .  Biotin 5 MG TABS, Take 5,000 each by mouth daily., Disp: , Rfl:  .  Cholecalciferol (VITAMIN D3) 1000 units CAPS, Take by mouth daily., Disp: , Rfl:  .  COSENTYX SENSOREADY, 300 MG, 150 MG/ML SOAJ, Inject 300 mg into the muscle once a week. , Disp: , Rfl:  .  diclofenac (VOLTAREN) 50 MG EC tablet, Take 50 mg  by mouth daily. , Disp: , Rfl:  .  diclofenac sodium (VOLTAREN) 1 % GEL, Apply topically as needed. , Disp: , Rfl:  .  fluticasone (FLONASE) 50 MCG/ACT nasal spray, Place 2 sprays into both nostrils daily. (Patient taking differently: Place 2 sprays into both nostrils daily. ), Disp: 48 g, Rfl: 3 .  halobetasol (ULTRAVATE) 0.05 % ointment, , Disp: , Rfl:  .  leflunomide (ARAVA) 20 MG tablet, Take 20 mg by mouth daily. , Disp: , Rfl:  .  meclizine (ANTIVERT) 12.5 MG tablet, Take 12.5 mg by mouth as needed. , Disp: , Rfl:  .  ondansetron (ZOFRAN) 4 MG tablet, Take 4 mg by mouth every 8 (eight) hours as needed. , Disp: , Rfl:  .  oxyCODONE-acetaminophen (PERCOCET) 7.5-325 MG per tablet, Take 1 tablet by mouth every 8 (eight) hours as needed. , Disp: , Rfl:  .  solifenacin (VESICARE) 5 MG tablet, Take 1 tablet (5 mg total) by mouth daily. (Patient not taking: Reported on 02/27/2018), Disp: 30 tablet, Rfl: 11 .  tiZANidine (ZANAFLEX) 2 MG tablet, Take 2 mg by mouth every 6 (six) hours as needed for muscle spasms., Disp: , Rfl:  .  triamcinolone cream (KENALOG) 0.5 %, APPLY TO AFFECTED AREA TWICE A DAY, Disp: , Rfl: 0 .  vitamin B-12 (CYANOCOBALAMIN) 1000 MCG tablet, Take 1,000 mcg by mouth as needed. , Disp: , Rfl:  .  vitamin C (ASCORBIC ACID) 500 MG tablet, VITAMIN C, 500MG  (Oral Tablet)  1 Every Day for 0 days  Quantity: 0.00;  Refills: 0   Ordered :28-Jul-2014  Abran Richard ;  Started 15-Nov-2008 Active Comments: DX: 627.2, Disp: , Rfl:  .  zolpidem (AMBIEN) 10 MG tablet, Take 1 tablet (10 mg total) by mouth at bedtime., Disp: 30 tablet, Rfl: 4  Patient Care Team: Birdie Sons, MD as PCP - General (Family Medicine) Emmaline Kluver., MD (Rheumatology) Alfonso Patten, MD as Consulting Physician (Dermatology) Sharlet Salina, MD as Referring Physician (Physical Medicine and Rehabilitation)     Objective:   Vitals: There were no vitals taken for this visit.   BP 138/80 (BP Location:  Right Arm)   Pulse 68   Temp 98.1  F (36.7 C) (Oral)   Ht 5\' 3"  (1.6 m)   Wt 141 lb 6.4 oz (64.1 kg)   BMI 25.05 kg/m   BSA 1.69 m   Pain [place] 7    Physical Exam    General Appearance:    Alert, cooperative, no distress, appears stated age  Head:    Normocephalic, without obvious abnormality, atraumatic  Eyes:    PERRL, conjunctiva/corneas clear, EOM's intact, fundi    benign, both eyes  Ears:    Normal TM's and external ear canals, both ears  Nose:   Nares normal, septum midline, mucosa normal, no drainage    or sinus tenderness  Throat:   Lips, mucosa, and tongue normal; teeth and gums normal  Neck:   Supple, symmetrical, trachea midline, no adenopathy;    thyroid:  no enlargement/tenderness/nodules; no carotid   bruit or JVD  Back:     Symmetric, no curvature, ROM normal, no CVA tenderness  Lungs:     Clear to auscultation bilaterally, respirations unlabored  Chest Wall:    No tenderness or deformity   Heart:    Regular rate and rhythm, S1 and S2 normal, no murmur, rub   or gallop  Breast Exam:    normal appearance, no masses or tenderness  Abdomen:     Soft, non-tender, bowel sounds active all four quadrants,    no masses, no organomegaly  Pelvic:    deferred  Extremities:   Extremities normal, atraumatic, no cyanosis or edema  Pulses:   2+ and symmetric all extremities  Skin:   Skin color, texture, turgor normal, no rashes or lesions  Lymph nodes:   Cervical, supraclavicular, and axillary nodes normal  Neurologic:   CNII-XII intact, normal strength, sensation and reflexes    throughout   Activities of Daily Living In your present state of health, do you have any difficulty performing the following activities: 02/27/2018  Hearing? N  Vision? N  Difficulty concentrating or making decisions? N  Walking or climbing stairs? N  Dressing or bathing? N  Doing errands, shopping? N  Preparing Food and eating ? N  Using the Toilet? N  In the past six months, have you  accidently leaked urine? N  Do you have problems with loss of bowel control? N  Managing your Medications? N  Managing your Finances? N  Housekeeping or managing your Housekeeping? N  Some recent data might be hidden    Fall Risk Assessment Fall Risk  02/27/2018 02/26/2017 02/22/2016 01/24/2016 01/23/2015  Falls in the past year? No No No No No     Depression Screen PHQ 2/9 Scores 02/27/2018 02/26/2017 02/26/2017 02/22/2016  PHQ - 2 Score 0 0 0 0  PHQ- 9 Score - 5 - -   Audit-C Alcohol Use Screening   Alcohol Use Disorder Test (AUDIT) 02/27/2018  1. How often do you have a drink containing alcohol? 1  2. How many drinks containing alcohol do you have on a typical day when you are drinking? 0  3. How often do you have six or more drinks on one occasion? 0  AUDIT-C Score 1  Intervention/Follow-up AUDIT Score <7 follow-up not indicated    A score of 3 or more in women, and 4 or more in men indicates increased risk for alcohol abuse, EXCEPT if all of the points are from question 1    Assessment & Plan:    Annual Physical Reviewed patient's Family Medical History Reviewed and updated list of patient's medical providers  Assessment of cognitive impairment was done Assessed patient's functional ability Established a written schedule for health screening [place] Completed and Reviewed  Exercise Activities and Dietary recommendations Goals    . Increase water intake     Recommend increasing water intake to 6-8 glasses a day.        Immunization History  Administered Date(s) Administered  . Influenza, High Dose Seasonal PF 02/26/2017  . PPD Test 09/11/2015  . Pneumococcal Conjugate-13 01/23/2015  . Pneumococcal Polysaccharide-23 02/22/2016  . Td 02/26/2017  . Tdap 10/07/2006  . Zoster 07/06/2012    Health Maintenance  Topic Date Due  . INFLUENZA VACCINE  02/05/2018  . MAMMOGRAM  04/24/2019  . DEXA SCAN  04/23/2020  . COLONOSCOPY  03/05/2022  .  TETANUS/TDAP  02/27/2027  . Hepatitis C Screening  Completed  . PNA vac Low Risk Adult  Completed     Discussed health benefits of physical activity, and encouraged her to engage in regular exercise appropriate for her age and condition.    ------------------------------------------------------------------------------------------------------------  2. Alopecia Likely age and medication related. Will recheck thyroid functions.  - T4, free - TSH  3. Essential hypertension Much better since stopping Claritin D.  - EKG 12-Lead  4. Insomnia, unspecified type Doing well current dose of Ambien   Lelon Huh, MD  [facility]

## 2018-02-27 NOTE — Patient Instructions (Signed)
   The CDC recommends two doses of Shingrix (the shingles vaccine) separated by 2 to 6 months for adults age 69 years and older. I recommend checking with your pharmacy plan regarding coverage for this vaccine.     

## 2018-02-27 NOTE — Progress Notes (Signed)
Subjective:   Meredith Russell is a 67 y.o. female who presents for Medicare Annual (Subsequent) preventive examination.  Review of Systems:  N/A  Cardiac Risk Factors include: advanced age (>77men, >42 women);hypertension     Objective:     Vitals: BP 138/80 (BP Location: Right Arm)   Pulse 68   Temp 98.1 F (36.7 C) (Oral)   Ht 5\' 3"  (1.6 m)   Wt 141 lb 6.4 oz (64.1 kg)   BMI 25.05 kg/m   Body mass index is 25.05 kg/m.  Advanced Directives 02/27/2018 02/26/2017 01/24/2016 01/23/2015  Does Patient Have a Medical Advance Directive? Yes Yes Yes Yes  Type of Advance Directive Living will [place];Living will - Living will;Healthcare Power of [place] in Chart? - No - copy requested - -    Tobacco Social History   Tobacco Use  Smoking Status Former Smoker  . Last attempt to quit: 11/22/1987  . Years since quitting: 30.2  Smokeless Tobacco Never Used     Counseling given: Not Answered   Clinical Intake:  Pre-visit preparation completed: Yes  Pain : 0-10 Pain Score: 7  Pain Type: Other (Comment)(RA) Pain Location: (All over body) Pain Descriptors / Indicators: Sharp, Aching Pain Frequency: Intermittent     Nutritional Status: BMI 25 -29 Overweight Nutritional Risks: None Diabetes: No  How often do you need to have someone help you when you read instructions, pamphlets, or other written materials from your doctor or pharmacy?: 1 - Never  Interpreter Needed?: No  Information entered by :: [facility], LPN  Past Medical History:  Diagnosis Date  . Psoriasis   . Rheumatoid arthritis [facility])    Past Surgical History:  Procedure Laterality Date  . ABDOMINAL HYSTERECTOMY    . BACK SURGERY    . BREAST BIOPSY Right    neg  . BUNIONECTOMY    . CARPAL TUNNEL RELEASE Bilateral 09/2013  . EYE SURGERY    . HEMORROIDECTOMY    . JOINT REPLACEMENT     Family History  Problem Relation Age of Onset  . Heart  disease Mother   . Heart attack Mother   . Heart disease Father   . Cancer Father   . Diabetes Other   . Heart disease Other   . Hyperlipidemia Other   . Hypertension Other   . Bladder Cancer Neg Hx   . Kidney cancer Neg Hx    Social History   Socioeconomic History  . Marital status: Married    Spouse name: Shanon Brow  . Number of children: 2  . Years of education: Not on file  . Highest education level: High school graduate  Occupational History  . Not on file  Social Needs  . Financial resource strain: Not hard at all  . Food insecurity:    Worry: Never true    Inability: Never true  . Transportation needs:    Medical: No    Non-medical: No  Tobacco Use  . Smoking status: Former Smoker    Last attempt to quit: 11/22/1987    Years since quitting: 30.2  . Smokeless tobacco: Never Used  Substance and Sexual Activity  . Alcohol use: Yes    Comment: occasional; drinks vodka and whiskey  . Drug use: No  . Sexual activity: Not on file  Lifestyle  . Physical activity:    Days per week: Not on file    Minutes per session: Not on file  . Stress:  Rather much  Relationships  . Social connections:    Talks on phone: Not on file    Gets together: Not on file    Attends religious service: Not on file    Active member of club or organization: Not on file    Attends meetings of clubs or organizations: Not on file    Relationship status: Not on file  Other Topics Concern  . Not on file  Social History Narrative  . Not on file    Outpatient Encounter Medications as of 02/27/2018  Medication Sig  . amLODipine (NORVASC) 5 MG tablet Take 1 tablet (5 mg total) by mouth daily.  Marland Kitchen aspirin 81 MG tablet LOW-DOSE ASPIRIN, 81MG  (Oral Tablet)  1 Every Day for 0 days  Quantity: 0.00;  Refills: 0   Ordered :20-Sep-2010  Ashley Royalty ;  Started 21-Nov-2008 Active Comments: DX: 627.2  . Biotin 5 MG TABS Take 5,000 each by mouth daily.  . Cholecalciferol (VITAMIN D3) 1000 units CAPS Take by  mouth daily.  . COSENTYX SENSOREADY, 300 MG, 150 MG/ML SOAJ Inject 300 mg into the muscle once a week.   . diclofenac (VOLTAREN) 50 MG EC tablet Take 50 mg by mouth daily.   . diclofenac sodium (VOLTAREN) 1 % GEL Apply topically as needed.   . fluticasone (FLONASE) 50 MCG/ACT nasal spray Place 2 sprays into both nostrils daily. (Patient taking differently: Place 2 sprays into both nostrils daily. )  . halobetasol (ULTRAVATE) 0.05 % ointment   . leflunomide (ARAVA) 20 MG tablet Take 20 mg by mouth daily.   . meclizine (ANTIVERT) 12.5 MG tablet Take 12.5 mg by mouth as needed.   Marland Kitchen oxyCODONE-acetaminophen (PERCOCET) 7.5-325 MG per tablet Take 1 tablet by mouth every 8 (eight) hours as needed.   Marland Kitchen tiZANidine (ZANAFLEX) 2 MG tablet Take 2 mg by mouth every 6 (six) hours as needed for muscle spasms.  Marland Kitchen triamcinolone cream (KENALOG) 0.5 % APPLY TO AFFECTED AREA TWICE A DAY  . vitamin B-12 (CYANOCOBALAMIN) 1000 MCG tablet Take 1,000 mcg by mouth as needed.   . vitamin C (ASCORBIC ACID) 500 MG tablet VITAMIN C, 500MG  (Oral Tablet)  1 Every Day for 0 days  Quantity: 0.00;  Refills: 0   Ordered :28-Jul-2014  Abran Richard ;  Started 15-Nov-2008 Active Comments: DX: 627.2  . zolpidem (AMBIEN) 10 MG tablet Take 1 tablet (10 mg total) by mouth at bedtime.  Marland Kitchen CLARITIN-D 24 HOUR 10-240 MG 24 hr tablet TAKE 1 TABLET BY MOUTH DAILY AS NEEDED (Patient not taking: Reported on 02/27/2018)  . HUMIRA PEN 40 MG/0.8ML PNKT Inject 0.8 mLs as directed every 14 (fourteen) days.  . Meth-Hyo-M Bl-Na Phos-Ph Sal (URIBEL) 118 MG CAPS Take up to 4 capsules by mouth daily with a large glass of water  . OMEGA-3 FATTY ACIDS PO OMEGA-3 FATTY ACIDS, 1200MG  (Oral Capsule)  for 0 days  Quantity: 0.00;  Refills: 0   Ordered :20-Sep-2010  Ashley Royalty ;  Started 21-Nov-2008 Active Comments: DX: 714.0  . ondansetron (ZOFRAN) 4 MG tablet Take 4 mg by mouth every 8 (eight) hours as needed.   . solifenacin (VESICARE) 5 MG tablet Take 1 tablet  (5 mg total) by mouth daily. (Patient not taking: Reported on 02/27/2018)   No facility-administered encounter medications on file as of 02/27/2018.     Activities of Daily Living In your present state of health, do you have any difficulty performing the following activities: 02/27/2018  Hearing? N  Vision? N  Difficulty concentrating or making decisions? N  Walking or climbing stairs? N  Dressing or bathing? N  Doing errands, shopping? N  Preparing Food and eating ? N  Using the Toilet? N  In the past six months, have you accidently leaked urine? N  Do you have problems with loss of bowel control? N  Managing your Medications? N  Managing your Finances? N  Housekeeping or managing your Housekeeping? N  Some recent data might be hidden    Patient Care Team: Birdie Sons, MD as PCP - General (Family Medicine) Emmaline Kluver., MD (Rheumatology) Alfonso Patten, MD as Consulting Physician (Dermatology) Sharlet Salina, MD as Referring Physician (Physical Medicine and Rehabilitation)    Assessment:   This is a routine wellness examination for Aikam.  Exercise Activities and Dietary recommendations Current Exercise Habits: Home exercise routine, Type of exercise: walking, Time (Minutes): 30, Frequency (Times/Week): 7, Weekly Exercise (Minutes/Week): 210, Intensity: Mild, Exercise limited by: None identified  Goals    . Increase water intake     Recommend increasing water intake to 6-8 glasses a day.        Fall Risk Fall Risk  02/27/2018 02/26/2017 02/22/2016 01/24/2016 01/23/2015  Falls in the past year? No No No No No   Is the patient's home free of loose throw rugs in walkways, pet beds, electrical cords, etc?   yes      Grab bars in the bathroom? no      Handrails on the stairs?   yes      Adequate lighting?   yes  Timed Get Up and Go performed: N/A  Depression Screen PHQ 2/9 Scores 02/27/2018 02/26/2017 02/26/2017 02/22/2016  PHQ - 2 Score 0 0 0 0  PHQ- 9 Score  - 5 - -     Cognitive Function: Pt declined screening today.         Immunization History  Administered Date(s) Administered  . Influenza, High Dose Seasonal PF 02/26/2017  . PPD Test 09/11/2015  . Pneumococcal Conjugate-13 01/23/2015  . Pneumococcal Polysaccharide-23 02/22/2016  . Td 02/26/2017  . Tdap 10/07/2006  . Zoster 07/06/2012    Qualifies for Shingles Vaccine? Due for Shingles vaccine. Declined my offer to administer today. Education has been provided regarding the importance of this vaccine. Pt has been advised to call her insurance company to determine her out of pocket expense. Advised she may also receive this vaccine at her local pharmacy or Health Dept. Verbalized acceptance and understanding.  Screening Tests Health Maintenance  Topic Date Due  . INFLUENZA VACCINE  02/05/2018  . MAMMOGRAM  04/24/2019  . DEXA SCAN  04/23/2020  . COLONOSCOPY  03/05/2022  . TETANUS/TDAP  02/27/2027  . Hepatitis C Screening  Completed  . PNA vac Low Risk Adult  Completed    Cancer Screenings: Lung: Low Dose CT Chest recommended if Age 51-80 years, 30 pack-year currently smoking OR have quit w/in 15years. Patient does not qualify. Breast:  Up to date on Mammogram? Yes   Up to date of Bone Density/Dexa? Yes Colorectal: Up to date  Additional Screenings:  Hepatitis C Screening: Up to date     Plan:  I have personally reviewed and addressed the Medicare Annual Wellness questionnaire and have noted the following in the patient's chart:  A. Medical and social history B. Use of alcohol, tobacco or illicit drugs  C. Current medications and supplements D. Functional ability and status E.  Nutritional status F.  Physical activity G. Advance directives  H. List of other physicians I.  Hospitalizations, surgeries, and ER visits in previous 12 months J.  [place] such as hearing and vision if needed, cognitive and depression L. Referrals and appointments - none  In  addition, I have reviewed and discussed with patient certain preventive protocols, quality metrics, and best practice recommendations. A written personalized care plan for preventive services as well as general preventive health recommendations were provided to patient.  See attached scanned questionnaire for additional information.   Signed,  Fabio Neighbors, LPN Nurse Health Advisor   Nurse Recommendations: None.

## 2018-02-27 NOTE — Patient Instructions (Addendum)
Meredith Russell , Thank you for taking time to come for your Medicare Wellness Visit. I appreciate your ongoing commitment to your health goals. Please review the following plan we discussed and let me know if I can assist you in the future.   Screening recommendations/referrals: Colonoscopy: Up to date Mammogram: Up to date Bone Density: Up to date Recommended yearly ophthalmology/optometry visit for glaucoma screening and checkup Recommended yearly dental visit for hygiene and checkup  Vaccinations: Influenza vaccine: Up to date Pneumococcal vaccine: Up to date Tdap vaccine: Up to date Shingles vaccine: Pt declines today.     Advanced directives: Please bring a copy of your POA (Power of Attorney) and/or Living Will to your next appointment.   Conditions/risks identified: Continue current diet plan of eating more fruits and vegetables, avoiding salt and decreasing sugar intake.   Next appointment: 10:00 AM today with Dr Caryn Section.    Preventive Care 40 Years and Older, Female Preventive care refers to lifestyle choices and visits with your health care provider that can promote health and wellness. What does preventive care include?  A yearly physical exam. This is also called an annual well check.  Dental exams once or twice a year.  Routine eye exams. Ask your health care provider how often you should have your eyes checked.  Personal lifestyle choices, including:  Daily care of your teeth and gums.  Regular physical activity.  Eating a healthy diet.  Avoiding tobacco and drug use.  Limiting alcohol use.  Practicing safe sex.  Taking low-dose aspirin every day.  Taking vitamin and mineral supplements as recommended by your health care provider. What happens during an annual well check? The services and screenings done by your health care provider during your annual well check will depend on your age, overall health, lifestyle risk factors, and family history of  disease. Counseling  Your health care provider may ask you questions about your:  Alcohol use.  Tobacco use.  Drug use.  Emotional well-being.  Home and relationship well-being.  Sexual activity.  Eating habits.  History of falls.  Memory and ability to understand (cognition).  Work and work Statistician.  Reproductive health. Screening  You may have the following tests or measurements:  Height, weight, and BMI.  Blood pressure.  Lipid and cholesterol levels. These may be checked every 5 years, or more frequently if you are over 67 years old.  Skin check.  Lung cancer screening. You may have this screening every year starting at age 75 if you have a 30-pack-year history of smoking and currently smoke or have quit within the past 15 years.  Fecal occult blood test (FOBT) of the stool. You may have this test every year starting at age 56.  Flexible sigmoidoscopy or colonoscopy. You may have a sigmoidoscopy every 5 years or a colonoscopy every 10 years starting at age 16.  Hepatitis C blood test.  Hepatitis B blood test.  Sexually transmitted disease (STD) testing.  Diabetes screening. This is done by checking your blood sugar (glucose) after you have not eaten for a while (fasting). You may have this done every 1-3 years.  Bone density scan. This is done to screen for osteoporosis. You may have this done starting at age 10.  Mammogram. This may be done every 1-2 years. Talk to your health care provider about how often you should have regular mammograms. Talk with your health care provider about your test results, treatment options, and if necessary, the need for more tests. Vaccines  Your health care provider may recommend certain vaccines, such as:  Influenza vaccine. This is recommended every year.  Tetanus, diphtheria, and acellular pertussis (Tdap, Td) vaccine. You may need a Td booster every 10 years.  Zoster vaccine. You may need this after age  74.  Pneumococcal 13-valent conjugate (PCV13) vaccine. One dose is recommended after age 19.  Pneumococcal polysaccharide (PPSV23) vaccine. One dose is recommended after age 2. Talk to your health care provider about which screenings and vaccines you need and how often you need them. This information is not intended to replace advice given to you by your health care provider. Make sure you discuss any questions you have with your health care provider. Document Released: 07/21/2015 Document Revised: 03/13/2016 Document Reviewed: 04/25/2015 Elsevier Interactive Patient Education  2017 [employer] Prevention in the Home Falls can cause injuries. They can happen to people of all ages. There are many things you can do to make your home safe and to help prevent falls. What can I do on the outside of my home?  Regularly fix the edges of walkways and driveways and fix any cracks.  Remove anything that might make you trip as you walk through a door, such as a raised step or threshold.  Trim any bushes or trees on the path to your home.  Use bright outdoor lighting.  Clear any walking paths of anything that might make someone trip, such as rocks or tools.  Regularly check to see if handrails are loose or broken. Make sure that both sides of any steps have handrails.  Any raised decks and porches should have guardrails on the edges.  Have any leaves, snow, or ice cleared regularly.  Use sand or salt on walking paths during winter.  Clean up any spills in your garage right away. This includes oil or grease spills. What can I do in the bathroom?  Use night lights.  Install grab bars by the toilet and in the tub and shower. Do not use towel bars as grab bars.  Use non-skid mats or decals in the tub or shower.  If you need to sit down in the shower, use a plastic, non-slip stool.  Keep the floor dry. Clean up any water that spills on the floor as soon as it happens.  Remove  soap buildup in the tub or shower regularly.  Attach bath mats securely with double-sided non-slip rug tape.  Do not have throw rugs and other things on the floor that can make you trip. What can I do in the bedroom?  Use night lights.  Make sure that you have a light by your bed that is easy to reach.  Do not use any sheets or blankets that are too big for your bed. They should not hang down onto the floor.  Have a firm chair that has side arms. You can use this for support while you get dressed.  Do not have throw rugs and other things on the floor that can make you trip. What can I do in the kitchen?  Clean up any spills right away.  Avoid walking on wet floors.  Keep items that you use a lot in easy-to-reach places.  If you need to reach something above you, use a strong step stool that has a grab bar.  Keep electrical cords out of the way.  Do not use floor polish or wax that makes floors slippery. If you must use wax, use non-skid floor wax.  Do  not have throw rugs and other things on the floor that can make you trip. What can I do with my stairs?  Do not leave any items on the stairs.  Make sure that there are handrails on both sides of the stairs and use them. Fix handrails that are broken or loose. Make sure that handrails are as long as the stairways.  Check any carpeting to make sure that it is firmly attached to the stairs. Fix any carpet that is loose or worn.  Avoid having throw rugs at the top or bottom of the stairs. If you do have throw rugs, attach them to the floor with carpet tape.  Make sure that you have a light switch at the top of the stairs and the bottom of the stairs. If you do not have them, ask someone to add them for you. What else can I do to help prevent falls?  Wear shoes that:  Do not have high heels.  Have rubber bottoms.  Are comfortable and fit you well.  Are closed at the toe. Do not wear sandals.  If you use a  stepladder:  Make sure that it is fully opened. Do not climb a closed stepladder.  Make sure that both sides of the stepladder are locked into place.  Ask someone to hold it for you, if possible.  Clearly mark and make sure that you can see:  Any grab bars or handrails.  First and last steps.  Where the edge of each step is.  Use tools that help you move around (mobility aids) if they are needed. These include:  Canes.  Walkers.  Scooters.  Crutches.  Turn on the lights when you go into a dark area. Replace any light bulbs as soon as they burn out.  Set up your furniture so you have a clear path. Avoid moving your furniture around.  If any of your floors are uneven, fix them.  If there are any pets around you, be aware of where they are.  Review your medicines with your doctor. Some medicines can make you feel dizzy. This can increase your chance of falling. Ask your doctor what other things that you can do to help prevent falls. This information is not intended to replace advice given to you by your health care provider. Make sure you discuss any questions you have with your health care provider. Document Released: 04/20/2009 Document Revised: 11/30/2015 Document Reviewed: 07/29/2014 Elsevier Interactive Patient Education  2017 [employer].

## 2018-02-28 LAB — TSH: TSH: 1.94 u[IU]/mL (ref 0.450–4.500)

## 2018-02-28 LAB — T4, FREE: FREE T4: 1.29 ng/dL (ref 0.82–1.77)

## 2018-04-06 ENCOUNTER — Telehealth: Payer: Self-pay | Admitting: Family Medicine

## 2018-04-06 MED ORDER — HYDROCORTISONE ACE-PRAMOXINE 1-1 % RE CREA: 1.0000 "application " | TOPICAL_CREAM | Freq: Two times a day (BID) | RECTAL | 2 refills | Status: AC

## 2018-04-06 NOTE — Telephone Encounter (Signed)
Please advise 

## 2018-04-06 NOTE — Telephone Encounter (Signed)
Pt called needing a prescription for hemorrhoids.  She has been using OTC for 4 days and that is not helping.  She is leaving for [place] tomorrow and would like something sent to the pharmacy  [facility] [place]  CB#  [phone]  Thanks  Con Memos

## 2018-04-22 ENCOUNTER — Other Ambulatory Visit: Payer: Self-pay | Admitting: Family Medicine

## 2018-04-22 DIAGNOSIS — Z1231 Encounter for screening mammogram for malignant neoplasm of breast: Secondary | ICD-10-CM

## 2018-04-23 ENCOUNTER — Other Ambulatory Visit: Payer: Self-pay | Admitting: Family Medicine

## 2018-05-19 ENCOUNTER — Ambulatory Visit
Admission: RE | Admit: 2018-05-19 | Discharge: 2018-05-19 | Disposition: A | Discharge status: Home / Self Care | Payer: Medicare Other | Source: Ambulatory Visit | Attending: Family Medicine | Admitting: Family Medicine

## 2018-05-19 DIAGNOSIS — Z1231 Encounter for screening mammogram for malignant neoplasm of breast: Secondary | ICD-10-CM

## 2018-05-20 ENCOUNTER — Other Ambulatory Visit: Payer: Self-pay | Admitting: Family Medicine

## 2018-05-20 DIAGNOSIS — R928 Other abnormal and inconclusive findings on diagnostic imaging of breast: Secondary | ICD-10-CM

## 2018-05-20 DIAGNOSIS — R921 Mammographic calcification found on diagnostic imaging of breast: Secondary | ICD-10-CM

## 2018-05-27 ENCOUNTER — Ambulatory Visit
Admission: RE | Admit: 2018-05-27 | Discharge: 2018-05-27 | Disposition: A | Discharge status: Home / Self Care | Payer: Medicare Other | Source: Ambulatory Visit | Attending: Family Medicine | Admitting: Family Medicine

## 2018-05-27 ENCOUNTER — Other Ambulatory Visit: Payer: Self-pay | Admitting: Family Medicine

## 2018-05-27 DIAGNOSIS — R928 Other abnormal and inconclusive findings on diagnostic imaging of breast: Secondary | ICD-10-CM

## 2018-05-27 DIAGNOSIS — R921 Mammographic calcification found on diagnostic imaging of breast: Secondary | ICD-10-CM

## 2018-07-28 ENCOUNTER — Other Ambulatory Visit: Payer: Self-pay | Admitting: Physical Medicine and Rehabilitation

## 2018-07-28 DIAGNOSIS — M5441 Lumbago with sciatica, right side: Secondary | ICD-10-CM

## 2018-07-28 DIAGNOSIS — M5442 Lumbago with sciatica, left side: Principal | ICD-10-CM

## 2018-08-08 ENCOUNTER — Encounter: Payer: Self-pay | Admitting: Family Medicine

## 2018-08-08 ENCOUNTER — Ambulatory Visit
Admission: RE | Admit: 2018-08-08 | Discharge: 2018-08-08 | Disposition: A | Discharge status: Home / Self Care | Payer: Medicare Other | Source: Ambulatory Visit | Attending: Physical Medicine and Rehabilitation | Admitting: Physical Medicine and Rehabilitation

## 2018-08-08 DIAGNOSIS — M5442 Lumbago with sciatica, left side: Secondary | ICD-10-CM | POA: Diagnosis not present

## 2018-08-08 DIAGNOSIS — M5441 Lumbago with sciatica, right side: Secondary | ICD-10-CM | POA: Insufficient documentation

## 2018-08-25 ENCOUNTER — Encounter: Payer: Self-pay | Admitting: Family Medicine

## 2018-08-25 DIAGNOSIS — L4050 Arthropathic psoriasis, unspecified: Secondary | ICD-10-CM | POA: Insufficient documentation

## 2018-09-11 ENCOUNTER — Ambulatory Visit: Payer: Self-pay | Admitting: Surgery

## 2018-09-11 NOTE — H&P (View-Only) (Signed)
Subjective:  CC: Anal skin tag [K64.4]   HPI:  Meredith Russell is a 68 y.o. female who was referrred by Self for above. Symptoms were first noted a few years ago.Denies bleeding, but Pain is achy, intermittent and discomfort, confined to the perirectal area, without radiation.  Associated with skin tags, exacerbated by loose stools requiring extensive wiping  Toilet habits: Daily, soft BMs, not requiring any straining, with occasional diarrhea.   Patient denies a personal history of colon cancer. Patient denies a personal history of IBD. Patient denies to history of polyps.  Last colonoscopy in 2014, recommended to return in 69yrs.   Past Medical History:  has a past medical history of ACL (anterior cruciate ligament) rupture, Allergic state, Arthritis, Cataract, Chicken pox, Hemorrhoids, Hypertension, Meniscus tear, Migraine headache, Osteoarthritis, and Rheumatoid arthritis(714.0) (CMS-HCC).  Past Surgical History:  has a past surgical history that includes Hysterectomy; Cataract extraction (Right); Appendectomy; Arthroscopic Rotator Cuff Repair (Right, 2006); Anterior fusion cervical spine (2006); Arthroscopy Knee (Left, 9/82/08); Arthroscopic repair ACL (10/27/2007); Endoscopic Carpal Tunnel Release (Bilateral, 08/12/13); Hemorrhoidectomy By Simple Ligation; Bilateral Bunionectomies 1989; Ovariectomy; Right total shoulder artheroplasty (01/19/2008); Bilateral total knee arthroplasties (09/07/2008); and Posterior fusion lumbar spine.  Family History: family history includes Cancer in her father; Coronary Artery Disease (Blocked arteries around heart) in her sister and sister; Hyperlipidemia (Elevated cholesterol) in her brother; Myocardial Infarction (Heart attack) in her mother.  Social History:  reports that she quit smoking about 31 years ago. She started smoking about 57 years ago. She has a 13.00 pack-year smoking history. She has never used smokeless tobacco. She reports current alcohol  use. She reports that she does not use drugs.  Current Medications: has a current medication list which includes the following prescription(s): amlodipine, ascorbic acid (vitamin c), aspirin, biotin, cholecalciferol, cosentyx pen (2 pens), cyanocobalamin, diclofenac, diclofenac, fexofenadine hcl, fluticasone propionate, leflunomide, oxycodone-acetaminophen, tizanidine, and zolpidem.  Allergies:       Allergies as of 09/09/2018 - Reviewed 09/09/2018  Allergen Reaction Noted  . Humira [adalimumab] Itching and Rash 01/20/2018  . Lyrica [pregabalin] Other (See Comments) 10/19/2013  . Savella  [milnacipran] Rash 02/10/2015  . Sulfa (sulfonamide antibiotics) Nausea 10/19/2013    ROS:  A 15 point review of systems was performed and pertinent positives and negatives noted in HPI  Objective:   BP (!) 152/91   Pulse 69   Ht 152.4 cm (5')   Wt 65.8 kg (145 lb)   LMP  (LMP Unknown) Comment: Hysterectomy  BMI 28.32 kg/m   Constitutional :  alert, appears stated age, cooperative and no distress  Lymphatics/Throat::  no asymmetry, masses, or scars  Respiratory:  clear to auscultation bilaterally  Cardiovascular:  regular rate and rhythm  Gastrointestinal: soft, non-tender; bowel sounds normal; no masses,  no organomegaly.    Musculoskeletal: Steady gait and movement  Skin: Cool and moist,   Psychiatric: Normal affect, non-agitated, not confused  Genital/Rectal: Chaperone present for exam.  External exam noted to have several external skin tags, two column lateral aspect left and right.  DRE revealed, normal rectal tone, with palpable internal hemorrhoid noted at deeper portions of skin tags with minimal discomfort.  After obtaining verbal consent, an anoscope was inserted after prepped with lubricant and internal hemorrhoids noted on DRE confirmed visually, with no evidence of thrombosis. No other pathology such as fissures, fistulas, polyps noted.  Scope withdrawn and patient tolerate  procedure well.     LABS:  n/a   RADS: n/a Assessment:  Anal skin tag [K64.4]  Plan:  1. Anal skin tag [K64.4] Discussed risks/benefits/alternatives to surgery.  Alternatives include the options of observation, medical management.  Benefits include symptomatic relief.  I discussed  in detail and the complications related to the operation and the anesthesia, including bleeding, infection, recurrence, remote possibility of temporary or permanent fecal incontinence, poor/delayed wound healing, chronic pain, and additional procedures to address said risks. The risks of general anesthetic, if used, includes MI, CVA, sudden death or even reaction to anesthetic medications also discussed.   We also discussed typical post operative recovery which includes weeks to potentially months of anal pain, drainage, occasional bleeding, and sense of fecal urgency.    ED return precautions given for sudden increase in pain, bleeding, with possible accompanying fever, nausea, and/or vomiting.  The patient understands the risks, any and all questions were answered to the patient's satisfaction.  2. Patient has elected to proceed with surgical treatment. Procedure will be scheduled.  Written consent was obtained.    Electronically signed by Benjamine Sprague, DO on 09/09/2018 2:23 PM

## 2018-09-11 NOTE — H&P (Signed)
Subjective:  CC: Anal skin tag [K64.4]   HPI:  Meredith Russell is a 68 y.o. female who was referrred by Self for above. Symptoms were first noted a few years ago.Denies bleeding, but Pain is achy, intermittent and discomfort, confined to the perirectal area, without radiation.  Associated with skin tags, exacerbated by loose stools requiring extensive wiping  Toilet habits: Daily, soft BMs, not requiring any straining, with occasional diarrhea.   Patient denies a personal history of colon cancer. Patient denies a personal history of IBD. Patient denies to history of polyps.  Last colonoscopy in 2014, recommended to return in 46yrs.   Past Medical History:  has a past medical history of ACL (anterior cruciate ligament) rupture, Allergic state, Arthritis, Cataract, Chicken pox, Hemorrhoids, Hypertension, Meniscus tear, Migraine headache, Osteoarthritis, and Rheumatoid arthritis(714.0) (CMS-HCC).  Past Surgical History:  has a past surgical history that includes Hysterectomy; Cataract extraction (Right); Appendectomy; Arthroscopic Rotator Cuff Repair (Right, 2006); Anterior fusion cervical spine (2006); Arthroscopy Knee (Left, 9/82/08); Arthroscopic repair ACL (10/27/2007); Endoscopic Carpal Tunnel Release (Bilateral, 08/12/13); Hemorrhoidectomy By Simple Ligation; Bilateral Bunionectomies 1989; Ovariectomy; Right total shoulder artheroplasty (01/19/2008); Bilateral total knee arthroplasties (09/07/2008); and Posterior fusion lumbar spine.  Family History: family history includes Cancer in her father; Coronary Artery Disease (Blocked arteries around heart) in her sister and sister; Hyperlipidemia (Elevated cholesterol) in her brother; Myocardial Infarction (Heart attack) in her mother.  Social History:  reports that she quit smoking about 31 years ago. She started smoking about 57 years ago. She has a 13.00 pack-year smoking history. She has never used smokeless tobacco. She reports current alcohol  use. She reports that she does not use drugs.  Current Medications: has a current medication list which includes the following prescription(s): amlodipine, ascorbic acid (vitamin c), aspirin, biotin, cholecalciferol, cosentyx pen (2 pens), cyanocobalamin, diclofenac, diclofenac, fexofenadine hcl, fluticasone propionate, leflunomide, oxycodone-acetaminophen, tizanidine, and zolpidem.  Allergies:       Allergies as of 09/09/2018 - Reviewed 09/09/2018  Allergen Reaction Noted  . Humira [adalimumab] Itching and Rash 01/20/2018  . Lyrica [pregabalin] Other (See Comments) 10/19/2013  . Savella  [milnacipran] Rash 02/10/2015  . Sulfa (sulfonamide antibiotics) Nausea 10/19/2013    ROS:  A 15 point review of systems was performed and pertinent positives and negatives noted in HPI  Objective:   BP (!) 152/91   Pulse 69   Ht 152.4 cm (5')   Wt 65.8 kg (145 lb)   LMP  (LMP Unknown) Comment: Hysterectomy  BMI 28.32 kg/m   Constitutional :  alert, appears stated age, cooperative and no distress  Lymphatics/Throat::  no asymmetry, masses, or scars  Respiratory:  clear to auscultation bilaterally  Cardiovascular:  regular rate and rhythm  Gastrointestinal: soft, non-tender; bowel sounds normal; no masses,  no organomegaly.    Musculoskeletal: Steady gait and movement  Skin: Cool and moist,   Psychiatric: Normal affect, non-agitated, not confused  Genital/Rectal: Chaperone present for exam.  External exam noted to have several external skin tags, two column lateral aspect left and right.  DRE revealed, normal rectal tone, with palpable internal hemorrhoid noted at deeper portions of skin tags with minimal discomfort.  After obtaining verbal consent, an anoscope was inserted after prepped with lubricant and internal hemorrhoids noted on DRE confirmed visually, with no evidence of thrombosis. No other pathology such as fissures, fistulas, polyps noted.  Scope withdrawn and patient tolerate  procedure well.     LABS:  n/a   RADS: n/a Assessment:  Anal skin tag [K64.4]  Plan:  1. Anal skin tag [K64.4] Discussed risks/benefits/alternatives to surgery.  Alternatives include the options of observation, medical management.  Benefits include symptomatic relief.  I discussed  in detail and the complications related to the operation and the anesthesia, including bleeding, infection, recurrence, remote possibility of temporary or permanent fecal incontinence, poor/delayed wound healing, chronic pain, and additional procedures to address said risks. The risks of general anesthetic, if used, includes MI, CVA, sudden death or even reaction to anesthetic medications also discussed.   We also discussed typical post operative recovery which includes weeks to potentially months of anal pain, drainage, occasional bleeding, and sense of fecal urgency.    ED return precautions given for sudden increase in pain, bleeding, with possible accompanying fever, nausea, and/or vomiting.  The patient understands the risks, any and all questions were answered to the patient's satisfaction.  2. Patient has elected to proceed with surgical treatment. Procedure will be scheduled.  Written consent was obtained.    Electronically signed by Benjamine Sprague, DO on 09/09/2018 2:23 PM

## 2018-09-16 ENCOUNTER — Other Ambulatory Visit: Payer: Self-pay

## 2018-09-16 ENCOUNTER — Encounter
Admission: RE | Admit: 2018-09-16 | Discharge: 2018-09-16 | Disposition: A | Discharge status: Home / Self Care | Payer: Medicare Other | Source: Ambulatory Visit | Attending: Surgery | Admitting: Surgery

## 2018-09-16 DIAGNOSIS — Z7982 Long term (current) use of aspirin: Secondary | ICD-10-CM | POA: Diagnosis not present

## 2018-09-16 DIAGNOSIS — Z87891 Personal history of nicotine dependence: Secondary | ICD-10-CM | POA: Diagnosis not present

## 2018-09-16 DIAGNOSIS — Z7951 Long term (current) use of inhaled steroids: Secondary | ICD-10-CM | POA: Diagnosis not present

## 2018-09-16 DIAGNOSIS — Z01810 Encounter for preprocedural cardiovascular examination: Secondary | ICD-10-CM

## 2018-09-16 DIAGNOSIS — Z79899 Other long term (current) drug therapy: Secondary | ICD-10-CM | POA: Diagnosis not present

## 2018-09-16 DIAGNOSIS — K641 Second degree hemorrhoids: Secondary | ICD-10-CM | POA: Diagnosis not present

## 2018-09-16 DIAGNOSIS — K644 Residual hemorrhoidal skin tags: Secondary | ICD-10-CM | POA: Diagnosis not present

## 2018-09-16 DIAGNOSIS — F1190 Opioid use, unspecified, uncomplicated: Secondary | ICD-10-CM | POA: Diagnosis not present

## 2018-09-16 DIAGNOSIS — I10 Essential (primary) hypertension: Secondary | ICD-10-CM | POA: Diagnosis not present

## 2018-09-16 HISTORY — DX: Unspecified hemorrhoids: K64.9

## 2018-09-16 NOTE — Patient Instructions (Signed)
Your procedure is scheduled on: Thursday, March 12 Report to Day Surgery on the 2nd floor of the [facility] at 7:30 am To find out your arrival time, please call [phone]  REMEMBER: Instructions that are not followed completely may result in serious medical risk, up to and including death; or upon the discretion of your surgeon and anesthesiologist your surgery may need to be rescheduled.  Do not eat food after midnight the night before surgery.  No gum chewing, lozengers or hard candies.  You may however, drink CLEAR liquids up to 2 hours before you are scheduled to arrive for your surgery. Do not drink anything within 2 hours of the start of your surgery.  Clear liquids include: - water  - apple juice without pulp - gatorade - black coffee or tea (Do NOT add milk or creamers to the coffee or tea) Do NOT drink anything that is not on this list.  No Alcohol for 24 hours before or after surgery.  No Smoking including e-cigarettes for 24 hours prior to surgery.  No chewable tobacco products for at least 6 hours prior to surgery.  No nicotine patches on the day of surgery.  On the morning of surgery brush your teeth with toothpaste and water, you may rinse your mouth with mouthwash if you wish. Do not swallow any toothpaste or mouthwash.  Notify your doctor if there is any change in your medical condition (cold, fever, infection).  Do not wear jewelry, make-up, hairpins, clips or nail polish.  Do not wear lotions, powders, or perfumes.   Do not shave 48 hours prior to surgery.   Contacts and dentures may not be worn into surgery.  Do not bring valuables to the hospital, including drivers license, insurance or credit cards.  [facility] is not responsible for any belongings or valuables.   DO NOT TAKE ANY MEDICATIONS THE MORNING OF SURGERY:  NOW!  Stop DICLOFENAC, ASPIRIN, Anti-inflammatories (NSAIDS) such as Advil, Aleve, Ibuprofen, Motrin, Naproxen, Naprosyn and  Aspirin based products such as Excedrin, Goodys Powder, BC Powder. (May take Tylenol or Acetaminophen if needed.)  Stop ANY OVER THE COUNTER supplements until after surgery.  Wear comfortable clothing (specific to your surgery type) to the hospital.  Plan for stool softeners for home use.  If you are being discharged the day of surgery, you will not be allowed to drive home. You will need a responsible adult to drive you home and stay with you that night.   If you are taking public transportation, you will need to have a responsible adult with you. Please confirm with your physician that it is acceptable to use public transportation.   Please call [phone] if you have any questions about these instructions.

## 2018-09-17 ENCOUNTER — Ambulatory Visit: Payer: Medicare Other | Admitting: Certified Registered"

## 2018-09-17 ENCOUNTER — Encounter
Admission: RE | Disposition: A | Discharge status: Home / Self Care | Payer: Self-pay | Source: Home / Self Care | Attending: Surgery

## 2018-09-17 ENCOUNTER — Ambulatory Visit
Admission: RE | Admit: 2018-09-17 | Discharge: 2018-09-17 | Disposition: A | Discharge status: Home / Self Care | Payer: Medicare Other | Attending: Surgery | Admitting: Surgery

## 2018-09-17 ENCOUNTER — Other Ambulatory Visit: Payer: Self-pay

## 2018-09-17 DIAGNOSIS — K644 Residual hemorrhoidal skin tags: Secondary | ICD-10-CM | POA: Insufficient documentation

## 2018-09-17 DIAGNOSIS — Z87891 Personal history of nicotine dependence: Secondary | ICD-10-CM | POA: Insufficient documentation

## 2018-09-17 DIAGNOSIS — I10 Essential (primary) hypertension: Secondary | ICD-10-CM | POA: Insufficient documentation

## 2018-09-17 DIAGNOSIS — Z79899 Other long term (current) drug therapy: Secondary | ICD-10-CM | POA: Insufficient documentation

## 2018-09-17 DIAGNOSIS — F1190 Opioid use, unspecified, uncomplicated: Secondary | ICD-10-CM | POA: Insufficient documentation

## 2018-09-17 DIAGNOSIS — K641 Second degree hemorrhoids: Secondary | ICD-10-CM

## 2018-09-17 DIAGNOSIS — Z7982 Long term (current) use of aspirin: Secondary | ICD-10-CM | POA: Insufficient documentation

## 2018-09-17 DIAGNOSIS — Z7951 Long term (current) use of inhaled steroids: Secondary | ICD-10-CM | POA: Insufficient documentation

## 2018-09-17 HISTORY — PX: HEMORRHOID SURGERY: SHX153

## 2018-09-17 SURGERY — HEMORRHOIDECTOMY
Anesthesia: General

## 2018-09-17 SURGERY — Surgical Case
Anesthesia: *Unknown

## 2018-09-17 MED ORDER — PROPOFOL 10 MG/ML IV BOLUS
INTRAVENOUS | Status: AC
  Filled 2018-09-17: qty 20

## 2018-09-17 MED ORDER — MIDAZOLAM HCL 2 MG/2ML IJ SOLN
INTRAMUSCULAR | Status: DC | PRN
  Administered 2018-09-17: 2 mg via INTRAVENOUS

## 2018-09-17 MED ORDER — FAMOTIDINE 20 MG PO TABS
ORAL_TABLET | ORAL | Status: AC
  Filled 2018-09-17: qty 1

## 2018-09-17 MED ORDER — BUPIVACAINE LIPOSOME 1.3 % IJ SUSP
INTRAMUSCULAR | Status: DC | PRN
  Administered 2018-09-17: 20 mL

## 2018-09-17 MED ORDER — LACTATED RINGERS IV SOLN
INTRAVENOUS | Status: DC
  Administered 2018-09-17: 09:00:00 via INTRAVENOUS

## 2018-09-17 MED ORDER — PROMETHAZINE HCL 25 MG/ML IJ SOLN: 6.2500 mg | INTRAMUSCULAR | Status: DC | PRN

## 2018-09-17 MED ORDER — BUPIVACAINE LIPOSOME 1.3 % IJ SUSP
INTRAMUSCULAR | Status: AC
  Filled 2018-09-17: qty 20

## 2018-09-17 MED ORDER — GABAPENTIN 300 MG PO CAPS
ORAL_CAPSULE | ORAL | Status: AC
  Filled 2018-09-17: qty 1

## 2018-09-17 MED ORDER — OXYCODONE HCL 5 MG/5ML PO SOLN: 5.0000 mg | Freq: Once | ORAL | Status: DC | PRN

## 2018-09-17 MED ORDER — ACETAMINOPHEN 500 MG PO TABS
ORAL_TABLET | ORAL | Status: AC
  Filled 2018-09-17: qty 2

## 2018-09-17 MED ORDER — DEXAMETHASONE SODIUM PHOSPHATE 10 MG/ML IJ SOLN
INTRAMUSCULAR | Status: DC | PRN
  Administered 2018-09-17: 10 mg via INTRAVENOUS

## 2018-09-17 MED ORDER — FENTANYL CITRATE (PF) 100 MCG/2ML IJ SOLN
INTRAMUSCULAR | Status: AC
  Filled 2018-09-17: qty 2

## 2018-09-17 MED ORDER — DOCUSATE SODIUM 100 MG PO CAPS: 100.0000 mg | ORAL_CAPSULE | Freq: Two times a day (BID) | ORAL | 0 refills | Status: AC | PRN

## 2018-09-17 MED ORDER — IBUPROFEN 800 MG PO TABS: 800.0000 mg | ORAL_TABLET | Freq: Three times a day (TID) | ORAL | 0 refills | Status: DC | PRN

## 2018-09-17 MED ORDER — GABAPENTIN 300 MG PO CAPS
300.0000 mg | ORAL_CAPSULE | ORAL | Status: AC
  Administered 2018-09-17: 300 mg via ORAL

## 2018-09-17 MED ORDER — FENTANYL CITRATE (PF) 100 MCG/2ML IJ SOLN
INTRAMUSCULAR | Status: DC | PRN
  Administered 2018-09-17 (×2): 50 ug via INTRAVENOUS

## 2018-09-17 MED ORDER — PROPOFOL 10 MG/ML IV BOLUS
INTRAVENOUS | Status: DC | PRN
  Administered 2018-09-17: 130 mg via INTRAVENOUS

## 2018-09-17 MED ORDER — OXYCODONE HCL 5 MG PO TABS: 5.0000 mg | ORAL_TABLET | Freq: Once | ORAL | Status: DC | PRN

## 2018-09-17 MED ORDER — MIDAZOLAM HCL 2 MG/2ML IJ SOLN
INTRAMUSCULAR | Status: AC
  Filled 2018-09-17: qty 2

## 2018-09-17 MED ORDER — FAMOTIDINE 20 MG PO TABS
20.0000 mg | ORAL_TABLET | Freq: Once | ORAL | Status: AC
  Administered 2018-09-17: 20 mg via ORAL

## 2018-09-17 MED ORDER — CHLORHEXIDINE GLUCONATE CLOTH 2 % EX PADS: 6.0000 | MEDICATED_PAD | Freq: Once | CUTANEOUS | Status: DC

## 2018-09-17 MED ORDER — ACETAMINOPHEN 500 MG PO TABS
1000.0000 mg | ORAL_TABLET | ORAL | Status: AC
  Administered 2018-09-17: 1000 mg via ORAL

## 2018-09-17 MED ORDER — MEPERIDINE HCL 50 MG/ML IJ SOLN: 6.2500 mg | INTRAMUSCULAR | Status: DC | PRN

## 2018-09-17 MED ORDER — ACETAMINOPHEN 325 MG PO TABS: 650.0000 mg | ORAL_TABLET | Freq: Three times a day (TID) | ORAL | 0 refills | Status: AC | PRN

## 2018-09-17 MED ORDER — ONDANSETRON HCL 4 MG/2ML IJ SOLN
INTRAMUSCULAR | Status: DC | PRN
  Administered 2018-09-17: 4 mg via INTRAVENOUS

## 2018-09-17 MED ORDER — LIDOCAINE HCL (CARDIAC) PF 100 MG/5ML IV SOSY
PREFILLED_SYRINGE | INTRAVENOUS | Status: DC | PRN
  Administered 2018-09-17: 100 mg via INTRAVENOUS

## 2018-09-17 MED ORDER — FENTANYL CITRATE (PF) 100 MCG/2ML IJ SOLN: 25.0000 ug | INTRAMUSCULAR | Status: DC | PRN

## 2018-09-17 SURGICAL SUPPLY — 30 items
BLADE SURG 15 STRL LF DISP TIS (BLADE) ×1 IMPLANT
BLADE SURG 15 STRL SS (BLADE) ×3
BRIEF STRETCH MATERNITY 2XLG (MISCELLANEOUS) ×3 IMPLANT
CANISTER SUCT 1200ML W/VALVE (MISCELLANEOUS) ×3 IMPLANT
COVER WAND RF STERILE (DRAPES) ×3 IMPLANT
DRAPE PERI LITHO V/GYN (MISCELLANEOUS) ×3 IMPLANT
DRAPE UNDER BUTTOCK W/FLU (DRAPES) ×3 IMPLANT
DRSG GAUZE FLUFF 36X18 (GAUZE/BANDAGES/DRESSINGS) ×3 IMPLANT
ELECT REM PT RETURN 9FT ADLT (ELECTROSURGICAL) ×3
ELECTRODE REM PT RTRN 9FT ADLT (ELECTROSURGICAL) ×1 IMPLANT
GLOVE BIOGEL PI IND STRL 7.0 (GLOVE) ×1 IMPLANT
GLOVE BIOGEL PI INDICATOR 7.0 (GLOVE) ×2
GLOVE SURG SYN 6.5 ES PF (GLOVE) ×3 IMPLANT
GLOVE SURG SYN 6.5 PF PI (GLOVE) ×1 IMPLANT
GOWN STRL REUS W/ TWL LRG LVL3 (GOWN DISPOSABLE) ×2 IMPLANT
GOWN STRL REUS W/TWL LRG LVL3 (GOWN DISPOSABLE) ×6
KIT TURNOVER CYSTO (KITS) ×3 IMPLANT
LABEL OR SOLS (LABEL) ×3 IMPLANT
NDL HYPO 25X1 1.5 SAFETY (NEEDLE) ×1 IMPLANT
NEEDLE HYPO 22GX1.5 SAFETY (NEEDLE) ×3 IMPLANT
NEEDLE HYPO 25X1 1.5 SAFETY (NEEDLE) ×3 IMPLANT
NS IRRIG 500ML POUR BTL (IV SOLUTION) ×3 IMPLANT
PACK BASIN MINOR ARMC (MISCELLANEOUS) ×3 IMPLANT
PAD PREP 24X41 OB/GYN DISP (PERSONAL CARE ITEMS) ×3 IMPLANT
SCRUB POVIDONE IODINE 4 OZ (MISCELLANEOUS) ×3 IMPLANT
SURGILUBE 2OZ TUBE FLIPTOP (MISCELLANEOUS) ×3 IMPLANT
SUT VIC AB 3-0 SH 27 (SUTURE) ×3
SUT VIC AB 3-0 SH 27X BRD (SUTURE) ×1 IMPLANT
SYR 10ML LL (SYRINGE) ×3 IMPLANT
SYR 20CC LL (SYRINGE) ×3 IMPLANT

## 2018-09-17 NOTE — Transfer of Care (Signed)
Immediate Anesthesia Transfer of Care Note  Patient: Meredith Russell  Procedure(s) Performed: HEMORRHOIDECTOMY SKIN TAG REMOVAL (N/A )  Patient Location: PACU  Anesthesia Type:General  Level of Consciousness: sedated  Airway & Oxygen Therapy: Patient Spontanous Breathing and Patient connected to face mask oxygen  Post-op Assessment: Report given to RN and Post -op Vital signs reviewed and stable  Post vital signs: Reviewed and stable  Last Vitals:  Vitals Value Taken Time  BP 132/73 09/17/2018 10:37 AM  Temp 36 C 09/17/2018 10:37 AM  Pulse 63 09/17/2018 10:42 AM  Resp 9 09/17/2018 10:42 AM  SpO2 99 % 09/17/2018 10:42 AM  Vitals shown include unvalidated device data.  Last Pain:  Vitals:   09/17/18 1037  TempSrc:   PainSc: 0-No pain         Complications: No apparent anesthesia complications

## 2018-09-17 NOTE — Op Note (Signed)
Preoperative diagnosis: second degree hemorrhoids, external skin tag   Postoperative diagnosis: same  Procedure: exam under anesthesia, hemorrhoidectomy.  Surgeon: Lysle Pearl  Anesthesia: general  Specimen: left lateral hemorrhoid, anterior skin tag Complications: none  EBL: 56mL  Wound classification: Clean Contaminated  Indications: Patient is a 68 y.o. female was found to have symptomatic hemorrhoids refractory to medical management.   Findings: 1. Second degree hemorrhoids, left lateral with associated skin tag 2. Anterior skin tag, no other significant internal hemorrhoids 2. Internal and external anal sphincter palpated and preserved 3. Adequate hemostasis  Description of procedure: The patient was brought to the operating room and general anesthesia was induced. Patient was placed high lithotomy position. A time-out was completed verifying correct patient, procedure, site, positioning, and implant(s) and/or special equipment prior to beginning this procedure. The perineum was prepped and draped in standard sterile fashion. An anoscope was introduced and the left lateral hemorrhoidal pedicles and anterior skin tag were identified.   A Kelly clamp was placed across the base of the left lateral pedicle near the dentate line and retracted externally to exteriorize the hemorrhoidal pedicles.  3-0 vicryl was placed at the most interior aspect of the hemorrhoid base.  The excess hemorrhoid tissue then removed using 15 blade and passed off operative field pending pathology.  The clamp was removed and the previously placed 3-0 vicryl was used to close the open base in a running fashion, ensuring no sphincter muscle was sutured into the wound.     A Kelly clamp was placed across the base of the atnerior skin tag  3-0 vicryl was placed at the most interior aspect of the hemorrhoid base.  The excess hemorrhoid tissue then removed using 15 blade and passed off operative field pending pathology.   The clamp was removed and the previously placed 3-0 vicryl was used to close the open base in a running fashion, ensuring no sphincter muscle was sutured into the wound.   Hemostasis achieved with additional 3-0 vicryl suture in areas of bleeding until all active bleeding controlled.  Last inspection of the anal canal did not note any additional hemorrhoidal tissue and no other pathology. Exparel injected as a perianal block. A gauze pad was tucked between the gluteal folds, and secured in place with mesh underwear.  The patient tolerated the procedure well and was taken to the postanesthesia care unit in stable condition. Sponge and instrument count correct at end of procedure

## 2018-09-17 NOTE — Anesthesia Preprocedure Evaluation (Signed)
Anesthesia Evaluation  Patient identified by MRN, date of birth, ID band Patient awake    Reviewed: Allergy & Precautions, NPO status , Patient's Chart, lab work & pertinent test results  History of Anesthesia Complications Negative for: history of anesthetic complications  Airway Mallampati: II  TM Distance: >3 FB Neck ROM: Full    Dental  (+) Implants   Pulmonary neg sleep apnea, neg COPD, former smoker,    breath sounds clear to auscultation- rhonchi (-) wheezing      Cardiovascular hypertension, (-) CAD, (-) Past MI, (-) Cardiac Stents and (-) CABG  Rhythm:Regular Rate:Normal - Systolic murmurs and - Diastolic murmurs    Neuro/Psych  Headaches, neg Seizures negative psych ROS   GI/Hepatic negative GI ROS, Neg liver ROS,   Endo/Other  negative endocrine ROSneg diabetes  Renal/GU negative Renal ROS     Musculoskeletal  (+) Arthritis , Rheumatoid disorders,    Abdominal (+) - obese,   Peds  Hematology  (+) anemia ,   Anesthesia Other Findings Past Medical History: No date: Hemorrhoids No date: Psoriasis No date: Rheumatoid arthritis (HCC) No date: Rheumatoid arthritis (HCC)   Reproductive/Obstetrics                             Anesthesia Physical Anesthesia Plan  ASA: II  Anesthesia Plan: General   Post-op Pain Management:    Induction: Intravenous  PONV Risk Score and Plan: 2 and Ondansetron, Dexamethasone and Midazolam  Airway Management Planned: LMA  Additional Equipment:   Intra-op Plan:   Post-operative Plan:   Informed Consent: I have reviewed the patients History and Physical, chart, labs and discussed the procedure including the risks, benefits and alternatives for the proposed anesthesia with the patient or authorized representative who has indicated his/her understanding and acceptance.     Dental advisory given  Plan Discussed with: CRNA and  Anesthesiologist  Anesthesia Plan Comments:         Anesthesia Quick Evaluation

## 2018-09-17 NOTE — Anesthesia Postprocedure Evaluation (Signed)
Anesthesia Post Note  Patient: Meredith Russell  Procedure(s) Performed: HEMORRHOIDECTOMY SKIN TAG REMOVAL (N/A )  Patient location during evaluation: PACU Anesthesia Type: General Level of consciousness: awake and alert and oriented Pain management: pain level controlled Vital Signs Assessment: post-procedure vital signs reviewed and stable Respiratory status: spontaneous breathing, nonlabored ventilation and respiratory function stable Cardiovascular status: blood pressure returned to baseline and stable Postop Assessment: no signs of nausea or vomiting Anesthetic complications: no     Last Vitals:  Vitals:   09/17/18 1147 09/17/18 1208  BP: (!) 153/73 (!) 151/81  Pulse: (!) 55   Resp: 16 18  Temp: (!) 36.4 C 36.4 C  SpO2: 99% 100%    Last Pain:  Vitals:   09/17/18 1208  TempSrc:   PainSc: 2                  Alisan Dokes

## 2018-09-17 NOTE — Interval H&P Note (Signed)
History and Physical Interval Note:  09/17/2018 11:22 AM  Meredith Russell  has presented today for surgery, with the diagnosis of K64.0 INTERNAL HEMORRHOIDS AND ANAL SKIN TAG.  The various methods of treatment have been discussed with the patient and family. After consideration of risks, benefits and other options for treatment, the patient has consented to  Procedure(s): HEMORRHOIDECTOMY SKIN TAG REMOVAL (N/A) as a surgical intervention.  The patient's history has been reviewed, patient examined, no change in status, stable for surgery.  I have reviewed the patient's chart and labs.  Questions were answered to the patient's satisfaction.     Labrandon Knoch Lysle Pearl

## 2018-09-17 NOTE — Anesthesia Post-op Follow-up Note (Signed)
Anesthesia QCDR form completed.        

## 2018-09-18 LAB — SURGICAL PATHOLOGY

## 2018-09-22 NOTE — Addendum Note (Signed)
Addendum  created 09/22/18 1729 by Doreen Salvage, CRNA   Charge Capture section accepted

## 2018-10-25 ENCOUNTER — Other Ambulatory Visit: Payer: Self-pay | Admitting: Family Medicine

## 2018-10-27 ENCOUNTER — Other Ambulatory Visit: Payer: Self-pay | Admitting: Family Medicine

## 2018-12-10 ENCOUNTER — Encounter: Payer: Self-pay | Admitting: Family Medicine

## 2018-12-10 DIAGNOSIS — R928 Other abnormal and inconclusive findings on diagnostic imaging of breast: Secondary | ICD-10-CM

## 2019-01-12 ENCOUNTER — Telehealth: Payer: Self-pay | Admitting: Family Medicine

## 2019-01-12 NOTE — Telephone Encounter (Signed)
Pt states she has not had a chance to call them yet.  I gave her the number to call and she states she will get it scheduled.    Thanks,   -Mickel Baas

## 2019-01-12 NOTE — Telephone Encounter (Signed)
Diagnostic mammogram was ordered last month, but I don't see that it was every scheduled. Please check with patient to see if this has been addressed.

## 2019-01-22 ENCOUNTER — Other Ambulatory Visit: Payer: Self-pay

## 2019-01-22 ENCOUNTER — Ambulatory Visit
Admission: RE | Admit: 2019-01-22 | Discharge: 2019-01-22 | Disposition: A | Discharge status: Home / Self Care | Payer: Medicare Other | Source: Ambulatory Visit | Attending: Family Medicine | Admitting: Family Medicine

## 2019-01-22 DIAGNOSIS — R928 Other abnormal and inconclusive findings on diagnostic imaging of breast: Secondary | ICD-10-CM | POA: Insufficient documentation

## 2019-03-03 NOTE — Progress Notes (Signed)
Subjective:   Meredith Russell is a 68 y.o. female who presents for Medicare Annual (Subsequent) preventive examination.    This visit is being conducted through telemedicine due to the COVID-19 pandemic. This patient has given me verbal consent via doximity to conduct this visit, patient states they are participating from their home address. Some vital signs may be absent or patient reported.    Patient identification: identified by name, DOB, and current address  Review of Systems:  N/A  Cardiac Risk Factors include: advanced age (>1men, >6 women);hypertension     Objective:     Vitals: There were no vitals taken for this visit.  There is no height or weight on file to calculate BMI. Unable to obtain vitals due to visit being conducted via telephonically.   Advanced Directives 03/04/2019 09/16/2018 02/27/2018 02/26/2017 01/24/2016 01/23/2015  Does Patient Have a Medical Advance Directive? Yes No;Yes Yes Yes Yes Yes  Type of Paramedic of [place];Living will [place];Living will Living will [place];Living will - Living will;Healthcare Power of [place] in Chart? No - copy requested No - copy requested - No - copy requested - -    Tobacco Social History   Tobacco Use  Smoking Status Former Smoker  . Quit date: 11/22/1987  . Years since quitting: 31.3  Smokeless Tobacco Never Used     Counseling given: Not Answered   Clinical Intake:  Pre-visit preparation completed: Yes  Pain : 0-10 Pain Score: 8  Pain Type: Acute pain Pain Location: Foot Pain Orientation: Left Pain Descriptors / Indicators: Aching Pain Frequency: Intermittent     Nutritional Risks: None Diabetes: No  How often do you need to have someone help you when you read instructions, pamphlets, or other written materials from your doctor or pharmacy?: 1 - Never  Interpreter Needed?: No  Information  entered by :: [facility], LPN  Past Medical History:  Diagnosis Date  . Hemorrhoids   . Psoriasis   . Rheumatoid arthritis ([place])   . Rheumatoid arthritis [facility])    Past Surgical History:  Procedure Laterality Date  . ABDOMINAL HYSTERECTOMY    . APPENDECTOMY    . BACK SURGERY  2005, 2006   Lumbar and cervical fusions  . BREAST BIOPSY Right 90s   neg  . BUNIONECTOMY    . CARPAL TUNNEL RELEASE Bilateral 09/2013  . CATARACT EXTRACTION, BILATERAL    . COLONOSCOPY    . EYE SURGERY    . HEMORRHOID SURGERY N/A 09/17/2018   Procedure: HEMORRHOIDECTOMY SKIN TAG REMOVAL;  Surgeon: Benjamine Sprague, DO;  Location: [facility] ORS;  Service: General;  Laterality: N/A;  . HEMORROIDECTOMY    . JOINT REPLACEMENT    . OOPHORECTOMY     one ovary removed  . REPLACEMENT TOTAL KNEE Bilateral 2009  . TOTAL SHOULDER REPLACEMENT Right 2010   Family History  Problem Relation Age of Onset  . Heart disease Mother   . Heart attack Mother   . Heart disease Father   . Cancer Father   . Diabetes Other   . Heart disease Other   . Hyperlipidemia Other   . Hypertension Other   . Bladder Cancer Neg Hx   . Kidney cancer Neg Hx    Social History   Socioeconomic History  . Marital status: Married    Spouse name: Shanon Brow  . Number of children: 2  . Years of education: Not on file  . Highest  education level: High school graduate  Occupational History  . Occupation: retired  Scientific laboratory technician  . Financial resource strain: Not hard at all  . Food insecurity    Worry: Never true    Inability: Never true  . Transportation needs    Medical: No    Non-medical: No  Tobacco Use  . Smoking status: Former Smoker    Quit date: 11/22/1987    Years since quitting: 31.3  . Smokeless tobacco: Never Used  Substance and Sexual Activity  . Alcohol use: Yes    Comment: occasional; drinks vodka and whiskey  . Drug use: No  . Sexual activity: Not on file  Lifestyle  . Physical activity    Days per week: 0 days    Minutes  per session: 0 min  . Stress: To some extent  Relationships  . Social Herbalist on phone: Patient refused    Gets together: Patient refused    Attends religious service: Patient refused    Active member of club or organization: Patient refused    Attends meetings of clubs or organizations: Patient refused    Relationship status: Patient refused  Other Topics Concern  . Not on file  Social History Narrative  . Not on file    Outpatient Encounter Medications as of 03/04/2019  Medication Sig  . amLODipine (NORVASC) 5 MG tablet TAKE 1 TABLET BY MOUTH  DAILY  . aspirin 81 MG tablet Take 81 mg by mouth daily.   . Biotin 10 MG CAPS Take by mouth daily.  . calcipotriene (DOVONOX) 0.005 % cream Apply 1 application topically 3 (three) times daily as needed (psoriasis).   . Cholecalciferol (VITAMIN D3) 1000 units CAPS Take 1,000 Units by mouth daily.   . COSENTYX SENSOREADY, 300 MG, 150 MG/ML SOAJ Inject 300 mg into the muscle every 28 (twenty-eight) days.   . diclofenac (VOLTAREN) 50 MG EC tablet Take 50 mg by mouth daily.   . diclofenac sodium (VOLTAREN) 1 % GEL Apply 2 g topically daily as needed (pain).   . fexofenadine (ALLEGRA) 180 MG tablet Take 180 mg by mouth daily.  . fluticasone (FLONASE) 50 MCG/ACT nasal spray Place 2 sprays into both nostrils daily as needed for allergies.  . halobetasol (ULTRAVATE) 0.05 % ointment Apply 1 application topically daily as needed (psoriasis).   Marland Kitchen leflunomide (ARAVA) 20 MG tablet Take 20 mg by mouth daily.   Marland Kitchen oxyCODONE-acetaminophen (PERCOCET) 7.5-325 MG per tablet Take 1 tablet by mouth every 8 (eight) hours as needed for severe pain.   Marland Kitchen tiZANidine (ZANAFLEX) 2 MG tablet Take 2 mg by mouth 3 (three) times daily.   Marland Kitchen triamcinolone cream (KENALOG) 0.5 % Apply 1 application topically 2 (two) times daily. As needed  . vitamin B-12 (CYANOCOBALAMIN) 1000 MCG tablet Take 1,000 mcg by mouth daily.   . vitamin C (ASCORBIC ACID) 500 MG tablet Take  500 mg by mouth daily.   Marland Kitchen zolpidem (AMBIEN) 10 MG tablet TAKE 1 TABLET BY MOUTH EVERYDAY AT BEDTIME  . ibuprofen (ADVIL,MOTRIN) 800 MG tablet Take 1 tablet (800 mg total) by mouth every 8 (eight) hours as needed for mild pain or moderate pain.  . pramoxine-hydrocortisone (ANALPRAM-HC) 1-1 % rectal cream Place 1 application rectally 2 (two) times daily. (Patient taking differently: Place 1 application rectally 2 (two) times daily as needed for hemorrhoids. )   No facility-administered encounter medications on file as of 03/04/2019.     Activities of Daily Living In your present  state of health, do you have any difficulty performing the following activities: 03/04/2019 09/16/2018  Hearing? N N  Vision? N N  Difficulty concentrating or making decisions? N N  Walking or climbing stairs? Y N  Comment Currently due to foot pain. -  Dressing or bathing? N N  Doing errands, shopping? N N  Preparing Food and eating ? N -  Using the Toilet? N -  In the past six months, have you accidently leaked urine? N -  Do you have problems with loss of bowel control? N -  Managing your Medications? N -  Managing your Finances? N -  Housekeeping or managing your Housekeeping? N -  Some recent data might be hidden    Patient Care Team: Birdie Sons, MD as PCP - General (Family Medicine) Emmaline Kluver., MD (Rheumatology) Alfonso Patten, MD as Consulting Physician (Dermatology) Sharlet Salina, MD as Referring Physician (Physical Medicine and Rehabilitation)    Assessment:   This is a routine wellness examination for Evalee.  Exercise Activities and Dietary recommendations Current Exercise Habits: Home exercise routine, Type of exercise: walking, Time (Minutes): 20, Frequency (Times/Week): 5, Weekly Exercise (Minutes/Week): 100, Intensity: Mild, Exercise limited by: None identified  Goals    . Increase water intake     Recommend increasing water intake to 6-8 glasses a day.        Fall  Risk: Fall Risk  03/04/2019 02/27/2018 02/26/2017 02/22/2016 01/24/2016  Falls in the past year? 0 No No No No    FALL RISK PREVENTION PERTAINING TO THE HOME:  Any stairs in or around the home? Yes  If so, are there any without handrails? No   Home free of loose throw rugs in walkways, pet beds, electrical cords, etc? Yes  Adequate lighting in your home to reduce risk of falls? Yes   ASSISTIVE DEVICES UTILIZED TO PREVENT FALLS:  Life alert? No  Use of a cane, walker or w/c? No  Grab bars in the bathroom? Yes  Shower chair or bench in shower? No  Elevated toilet seat or a handicapped toilet? Yes    TIMED UP AND GO:  Was the test performed? No .    Depression Screen PHQ 2/9 Scores 03/04/2019 02/27/2018 02/26/2017 02/26/2017  PHQ - 2 Score 1 0 0 0  PHQ- 9 Score - - 5 -     Cognitive Function: Declined today.         Immunization History  Administered Date(s) Administered  . Influenza, High Dose Seasonal PF 02/26/2017, 05/11/2018  . PPD Test 09/11/2015  . Pneumococcal Conjugate-13 01/23/2015  . Pneumococcal Polysaccharide-23 02/22/2016  . Td 02/26/2017  . Tdap 10/07/2006  . Zoster 07/06/2012    Qualifies for Shingles Vaccine? Yes  Zostavax completed 06/26/12. Due for Shingrix. Education has been provided regarding the importance of this vaccine. Pt has been advised to call insurance company to determine out of pocket expense. Advised may also receive vaccine at local pharmacy or Health Dept. Verbalized acceptance and understanding.  Tdap: Up to date  Flu Vaccine: Due for Flu vaccine. Does the patient want to receive this vaccine today?  No .   Pneumococcal Vaccine: Completed series  Screening Tests Health Maintenance  Topic Date Due  . INFLUENZA VACCINE  02/06/2019  . DEXA SCAN  04/23/2020  . MAMMOGRAM  01/21/2021  . COLONOSCOPY  03/05/2022  . TETANUS/TDAP  02/27/2027  . Hepatitis C Screening  Completed  . PNA vac Low Risk Adult  Completed  Cancer Screenings:   Colorectal Screening: Completed 03/05/12. Repeat every 10 years.  Mammogram: Completed 01/22/19.   Bone Density: Completed 04/23/17. Results reflect  OSTEOPENIA. Repeat every 5 years.   Lung Cancer Screening: (Low Dose CT Chest recommended if Age 69-80 years, 30 pack-year currently smoking OR have quit w/in 15years.) does not qualify.   Additional Screening:  Hepatitis C Screening:Up to date  Vision Screening: Recommended annual ophthalmology exams for early detection of glaucoma and other disorders of the eye.  Dental Screening: Recommended annual dental exams for proper oral hygiene  Community Resource Referral:  CRR required this visit?  No       Plan:  I have personally reviewed and addressed the Medicare Annual Wellness questionnaire and have noted the following in the patient's chart:  A. Medical and social history B. Use of alcohol, tobacco or illicit drugs  C. Current medications and supplements D. Functional ability and status E.  Nutritional status F.  Physical activity G. Advance directives H. List of other physicians I.  Hospitalizations, surgeries, and ER visits in previous 12 months J.  [place] such as hearing and vision if needed, cognitive and depression L. Referrals and appointments   In addition, I have reviewed and discussed with patient certain preventive protocols, quality metrics, and best practice recommendations. A written personalized care plan for preventive services as well as general preventive health recommendations were provided to patient. Nurse Health Advisor  Signed,    Aavya Shafer [place], [place]  [id number] Nurse Health Advisor   Nurse Notes: Pt to receive the influenza vaccine at next in office visit.

## 2019-03-04 ENCOUNTER — Ambulatory Visit (INDEPENDENT_AMBULATORY_CARE_PROVIDER_SITE_OTHER): Payer: Medicare Other

## 2019-03-04 ENCOUNTER — Encounter: Payer: Medicare Other | Admitting: Family Medicine

## 2019-03-04 ENCOUNTER — Other Ambulatory Visit: Payer: Self-pay

## 2019-03-04 DIAGNOSIS — Z0000 Encounter for general adult medical examination without abnormal findings: Secondary | ICD-10-CM

## 2019-03-04 NOTE — Patient Instructions (Signed)
[name] , Thank you for taking time to come for your Medicare Wellness Visit. I appreciate your ongoing commitment to your health goals. Please review the following plan we discussed and let me know if I can assist you in the future.   Screening recommendations/referrals: Colonoscopy: Up to date, due 02/2022 Mammogram: Up to date, due 01/2020 Bone Density: Up to date, due 04/2020 Recommended yearly ophthalmology/optometry visit for glaucoma screening and checkup Recommended yearly dental visit for hygiene and checkup  Vaccinations: Influenza vaccine: Currently due Pneumococcal vaccine: Completed series Tdap vaccine: Up to date, due 02/2027 Shingles vaccine: Pt declines today.     Advanced directives: Please bring a copy of your POA (Power of Attorney) and/or Living Will to your next appointment.   Conditions/risks identified: None.   Next appointment: 03/09/19 @ 9:40 AM with Dr Caryn Section.   Preventive Care 70 Years and Older, Female Preventive care refers to lifestyle choices and visits with your health care provider that can promote health and wellness. What does preventive care include?  A yearly physical exam. This is also called an annual well check.  Dental exams once or twice a year.  Routine eye exams. Ask your health care provider how often you should have your eyes checked.  Personal lifestyle choices, including:  Daily care of your teeth and gums.  Regular physical activity.  Eating a healthy diet.  Avoiding tobacco and drug use.  Limiting alcohol use.  Practicing safe sex.  Taking low-dose aspirin every day.  Taking vitamin and mineral supplements as recommended by your health care provider. What happens during an annual well check? The services and screenings done by your health care provider during your annual well check will depend on your age, overall health, lifestyle risk factors, and family history of disease. Counseling  Your health care provider  may ask you questions about your:  Alcohol use.  Tobacco use.  Drug use.  Emotional well-being.  Home and relationship well-being.  Sexual activity.  Eating habits.  History of falls.  Memory and ability to understand (cognition).  Work and work Statistician.  Reproductive health. Screening  You may have the following tests or measurements:  Height, weight, and BMI.  Blood pressure.  Lipid and cholesterol levels. These may be checked every 5 years, or more frequently if you are over 28 years old.  Skin check.  Lung cancer screening. You may have this screening every year starting at age 15 if you have a 30-pack-year history of smoking and currently smoke or have quit within the past 15 years.  Fecal occult blood test (FOBT) of the stool. You may have this test every year starting at age 43.  Flexible sigmoidoscopy or colonoscopy. You may have a sigmoidoscopy every 5 years or a colonoscopy every 10 years starting at age 8.  Hepatitis C blood test.  Hepatitis B blood test.  Sexually transmitted disease (STD) testing.  Diabetes screening. This is done by checking your blood sugar (glucose) after you have not eaten for a while (fasting). You may have this done every 1-3 years.  Bone density scan. This is done to screen for osteoporosis. You may have this done starting at age 31.  Mammogram. This may be done every 1-2 years. Talk to your health care provider about how often you should have regular mammograms. Talk with your health care provider about your test results, treatment options, and if necessary, the need for more tests. Vaccines  Your health care provider may recommend certain vaccines,  such as:  Influenza vaccine. This is recommended every year.  Tetanus, diphtheria, and acellular pertussis (Tdap, Td) vaccine. You may need a Td booster every 10 years.  Zoster vaccine. You may need this after age 63.  Pneumococcal 13-valent conjugate (PCV13) vaccine.  One dose is recommended after age 25.  Pneumococcal polysaccharide (PPSV23) vaccine. One dose is recommended after age 49. Talk to your health care provider about which screenings and vaccines you need and how often you need them. This information is not intended to replace advice given to you by your health care provider. Make sure you discuss any questions you have with your health care provider. Document Released: 07/21/2015 Document Revised: 03/13/2016 Document Reviewed: 04/25/2015 Elsevier Interactive Patient Education  2017 [place] Prevention in the Home Falls can cause injuries. They can happen to people of all ages. There are many things you can do to make your home safe and to help prevent falls. What can I do on the outside of my home?  Regularly fix the edges of walkways and driveways and fix any cracks.  Remove anything that might make you trip as you walk through a door, such as a raised step or threshold.  Trim any bushes or trees on the path to your home.  Use bright outdoor lighting.  Clear any walking paths of anything that might make someone trip, such as rocks or tools.  Regularly check to see if handrails are loose or broken. Make sure that both sides of any steps have handrails.  Any raised decks and porches should have guardrails on the edges.  Have any leaves, snow, or ice cleared regularly.  Use sand or salt on walking paths during winter.  Clean up any spills in your garage right away. This includes oil or grease spills. What can I do in the bathroom?  Use night lights.  Install grab bars by the toilet and in the tub and shower. Do not use towel bars as grab bars.  Use non-skid mats or decals in the tub or shower.  If you need to sit down in the shower, use a plastic, non-slip stool.  Keep the floor dry. Clean up any water that spills on the floor as soon as it happens.  Remove soap buildup in the tub or shower regularly.  Attach bath  mats securely with double-sided non-slip rug tape.  Do not have throw rugs and other things on the floor that can make you trip. What can I do in the bedroom?  Use night lights.  Make sure that you have a light by your bed that is easy to reach.  Do not use any sheets or blankets that are too big for your bed. They should not hang down onto the floor.  Have a firm chair that has side arms. You can use this for support while you get dressed.  Do not have throw rugs and other things on the floor that can make you trip. What can I do in the kitchen?  Clean up any spills right away.  Avoid walking on wet floors.  Keep items that you use a lot in easy-to-reach places.  If you need to reach something above you, use a strong step stool that has a grab bar.  Keep electrical cords out of the way.  Do not use floor polish or wax that makes floors slippery. If you must use wax, use non-skid floor wax.  Do not have throw rugs and other things on  the floor that can make you trip. What can I do with my stairs?  Do not leave any items on the stairs.  Make sure that there are handrails on both sides of the stairs and use them. Fix handrails that are broken or loose. Make sure that handrails are as long as the stairways.  Check any carpeting to make sure that it is firmly attached to the stairs. Fix any carpet that is loose or worn.  Avoid having throw rugs at the top or bottom of the stairs. If you do have throw rugs, attach them to the floor with carpet tape.  Make sure that you have a light switch at the top of the stairs and the bottom of the stairs. If you do not have them, ask someone to add them for you. What else can I do to help prevent falls?  Wear shoes that:  Do not have high heels.  Have rubber bottoms.  Are comfortable and fit you well.  Are closed at the toe. Do not wear sandals.  If you use a stepladder:  Make sure that it is fully opened. Do not climb a closed  stepladder.  Make sure that both sides of the stepladder are locked into place.  Ask someone to hold it for you, if possible.  Clearly mark and make sure that you can see:  Any grab bars or handrails.  First and last steps.  Where the edge of each step is.  Use tools that help you move around (mobility aids) if they are needed. These include:  Canes.  Walkers.  Scooters.  Crutches.  Turn on the lights when you go into a dark area. Replace any light bulbs as soon as they burn out.  Set up your furniture so you have a clear path. Avoid moving your furniture around.  If any of your floors are uneven, fix them.  If there are any pets around you, be aware of where they are.  Review your medicines with your doctor. Some medicines can make you feel dizzy. This can increase your chance of falling. Ask your doctor what other things that you can do to help prevent falls. This information is not intended to replace advice given to you by your health care provider. Make sure you discuss any questions you have with your health care provider. Document Released: 04/20/2009 Document Revised: 11/30/2015 Document Reviewed: 07/29/2014 Elsevier Interactive Patient Education  2017 [employer].

## 2019-03-09 ENCOUNTER — Other Ambulatory Visit: Payer: Self-pay

## 2019-03-09 ENCOUNTER — Ambulatory Visit (INDEPENDENT_AMBULATORY_CARE_PROVIDER_SITE_OTHER): Payer: Medicare Other | Admitting: Family Medicine

## 2019-03-09 ENCOUNTER — Encounter: Payer: Self-pay | Admitting: Family Medicine

## 2019-03-09 VITALS — BP 120/64 | HR 71 | Temp 97.1°F | Resp 15 | Ht 63.0 in | Wt 142.4 lb

## 2019-03-09 DIAGNOSIS — L4050 Arthropathic psoriasis, unspecified: Secondary | ICD-10-CM

## 2019-03-09 DIAGNOSIS — G4700 Insomnia, unspecified: Secondary | ICD-10-CM

## 2019-03-09 DIAGNOSIS — J301 Allergic rhinitis due to pollen: Secondary | ICD-10-CM

## 2019-03-09 DIAGNOSIS — M4722 Other spondylosis with radiculopathy, cervical region: Secondary | ICD-10-CM

## 2019-03-09 DIAGNOSIS — Z23 Encounter for immunization: Secondary | ICD-10-CM | POA: Diagnosis not present

## 2019-03-09 DIAGNOSIS — M069 Rheumatoid arthritis, unspecified: Secondary | ICD-10-CM

## 2019-03-09 DIAGNOSIS — M899 Disorder of bone, unspecified: Secondary | ICD-10-CM

## 2019-03-09 DIAGNOSIS — Z87891 Personal history of nicotine dependence: Secondary | ICD-10-CM

## 2019-03-09 DIAGNOSIS — I10 Essential (primary) hypertension: Secondary | ICD-10-CM

## 2019-03-09 DIAGNOSIS — Z0000 Encounter for general adult medical examination without abnormal findings: Secondary | ICD-10-CM | POA: Diagnosis not present

## 2019-03-09 DIAGNOSIS — M8580 Other specified disorders of bone density and structure, unspecified site: Secondary | ICD-10-CM

## 2019-03-09 NOTE — Patient Instructions (Signed)
.   Please review the attached list of medications and notify my office if there are any errors.   . Please bring all of your medications to every appointment so we can make sure that our medication list is the same as yours.   . It is especially important to get the annual flu vaccine this year. If you haven't had it already, please go to your pharmacy or call the office as soon as possible to schedule you flu shot.   The CDC recommends two doses of Shingrix (the shingles vaccine) separated by 2 to 6 months for adults age 31 years and older. I recommend checking with your insurance plan regarding coverage for this vaccine.

## 2019-03-09 NOTE — Progress Notes (Addendum)
Patient: Meredith Russell, Female    DOB: Jul 21, 1950, 68 y.o.   MRN: [id number] Visit Date: 03/09/2019  Today's Provider: Lelon Huh, MD   Chief Complaint  Patient presents with  . Annual Exam   Subjective:     Complete Physical Meredith Russell is a 68 y.o. female. She feels fairly well. She reports exercising by walking 64miles a day. She reports she is sleeping poorly.  Last reported:  Colonoscopy-03/05/12 Mammogram- 01/22/19 Bi-rad Cat. 3, Diagnostic mammogram scheduled for 05/2019 -----------------------------------------------------------   Hypertension, follow-up:  BP Readings from Last 3 Encounters:  03/09/19 120/64  09/17/18 (!) 151/81  09/16/18 (!) 153/88    She was last seen for hypertension 1 years ago.  BP at that visit was 138/80. Management changes since that visit include none. She reports excellent compliance with treatment. She is not having side effects.  She is exercising. She is adherent to low salt diet.   Outside blood pressures are being checked periodically. She is experiencing lower extremity edema.  Patient denies chest pain, chest pressure/discomfort, dyspnea, fatigue, irregular heart beat, orthopnea, palpitations and paroxysmal nocturnal dyspnea.   Cardiovascular risk factors include advanced age (older than 63 for men, 48 for women) and hypertension.  Use of agents associated with hypertension: NSAIDS.     Weight trend: stable Wt Readings from Last 3 Encounters:  03/09/19 142 lb 6.4 oz (64.6 kg)  09/17/18 140 lb (63.5 kg)  09/16/18 140 lb (63.5 kg)    Current diet: in general, a "healthy" diet    ------------------------------------------------------------------------  Follow up for Insomnia  The patient was last seen for this 1 years ago. Changes made at last visit include none, patient was to continue Ambien.  She reports good compliance with treatment. She feels that condition is Unchanged. Patient reports <5hrs of sleep  a night and reports waking up often at night She is not having side effects.   ------------------------------------------------------------------------------------  Review of Systems  Constitutional: Positive for activity change and fatigue.  HENT: Positive for sinus pressure and tinnitus.   Eyes: Positive for photophobia and itching.  Respiratory: Negative.   Cardiovascular: Negative.   Gastrointestinal: Negative.   Endocrine: Positive for cold intolerance.  Genitourinary: Positive for frequency.  Musculoskeletal: Positive for arthralgias, back pain, myalgias and neck pain.  Neurological: Negative.   Hematological: Negative.   Psychiatric/Behavioral: Positive for sleep disturbance.    Social History   Socioeconomic History  . Marital status: Married    Spouse name: Shanon Brow  . Number of children: 2  . Years of education: Not on file  . Highest education level: High school graduate  Occupational History  . Occupation: retired  Scientific laboratory technician  . Financial resource strain: Not hard at all  . Food insecurity    Worry: Never true    Inability: Never true  . Transportation needs    Medical: No    Non-medical: No  Tobacco Use  . Smoking status: Former Smoker    Quit date: 11/22/1987    Years since quitting: 31.3  . Smokeless tobacco: Never Used  Substance and Sexual Activity  . Alcohol use: Yes    Comment: occasional; drinks vodka and whiskey  . Drug use: No  . Sexual activity: Not on file  Lifestyle  . Physical activity    Days per week: 0 days    Minutes per session: 0 min  . Stress: To some extent  Relationships  . Social Herbalist on  phone: Patient refused    Gets together: Patient refused    Attends religious service: Patient refused    Active member of club or organization: Patient refused    Attends meetings of clubs or organizations: Patient refused    Relationship status: Patient refused  . Intimate partner violence    Fear of current or ex  partner: Patient refused    Emotionally abused: Patient refused    Physically abused: Patient refused    Forced sexual activity: Patient refused  Other Topics Concern  . Not on file  Social History Narrative  . Not on file    Past Medical History:  Diagnosis Date  . Hemorrhoids   . Psoriasis   . Rheumatoid arthritis ([place])   . Rheumatoid arthritis [facility])      Patient Active Problem List   Diagnosis Date Noted  . Psoriatic arthritis ([place]) 08/25/2018  . Cerebrovascular disease 05/10/2016  . Allergic state 01/23/2015  . Adaptation reaction 11/22/2014  . Insomnia 11/22/2014  . Cervical radiculitis 01/10/2014  . Cervical osteoarthritis 01/10/2014  . Carpal tunnel syndrome 01/10/2014  . DDD (degenerative disc disease), cervical 01/10/2014  . BP (high blood pressure) 02/08/2010  . Chronic infection of sinus 06/26/2009  . Allergic rhinitis 11/21/2008  . H/O total hysterectomy 11/18/2008  . History of tobacco use 11/18/2008  . Absolute anemia 11/15/2008  . Narrowing of intervertebral disc space 11/15/2008  . Migraine without aura and responsive to treatment 11/15/2008  . Arthritis, degenerative 11/15/2008  . Arthritis or polyarthritis, rheumatoid ([place]) 11/15/2008  . Osteopenia 12/17/2006    Past Surgical History:  Procedure Laterality Date  . ABDOMINAL HYSTERECTOMY    . APPENDECTOMY    . BACK SURGERY  2005, 2006   Lumbar and cervical fusions  . BREAST BIOPSY Right 90s   neg  . BUNIONECTOMY    . CARPAL TUNNEL RELEASE Bilateral 09/2013  . CATARACT EXTRACTION, BILATERAL    . COLONOSCOPY    . EYE SURGERY    . HEMORRHOID SURGERY N/A 09/17/2018   Procedure: HEMORRHOIDECTOMY SKIN TAG REMOVAL;  Surgeon: Benjamine Sprague, DO;  Location: [facility] ORS;  Service: General;  Laterality: N/A;  . HEMORROIDECTOMY    . JOINT REPLACEMENT    . OOPHORECTOMY     one ovary removed  . REPLACEMENT TOTAL KNEE Bilateral 2009  . TOTAL SHOULDER REPLACEMENT Right 2010    Her family history includes  Cancer in her father; Diabetes in an other family member; Heart attack in her mother; Heart disease in her father, mother, and another family member; Hyperlipidemia in an other family member; Hypertension in an other family member. There is no history of Bladder Cancer or Kidney cancer.   Current Outpatient Medications:  .  amLODipine (NORVASC) 5 MG tablet, TAKE 1 TABLET BY MOUTH  DAILY, Disp: 90 tablet, Rfl: 3 .  aspirin 81 MG tablet, Take 81 mg by mouth daily. , Disp: , Rfl:  .  Biotin 10 MG CAPS, Take by mouth daily., Disp: , Rfl:  .  calcipotriene (DOVONOX) 0.005 % cream, Apply 1 application topically 3 (three) times daily as needed (psoriasis). , Disp: , Rfl:  .  Cholecalciferol (VITAMIN D3) 1000 units CAPS, Take 1,000 Units by mouth daily. , Disp: , Rfl:  .  COSENTYX SENSOREADY, 300 MG, 150 MG/ML SOAJ, Inject 300 mg into the muscle every 28 (twenty-eight) days. , Disp: , Rfl:  .  diclofenac (VOLTAREN) 50 MG EC tablet, Take 50 mg by mouth daily. , Disp: , Rfl:  .  diclofenac sodium (VOLTAREN) 1 % GEL, Apply 2 g topically daily as needed (pain). , Disp: , Rfl:  .  fexofenadine (ALLEGRA) 180 MG tablet, Take 180 mg by mouth daily., Disp: , Rfl:  .  fluticasone (FLONASE) 50 MCG/ACT nasal spray, Place 2 sprays into both nostrils daily as needed for allergies., Disp: 16 g, Rfl: 3 .  halobetasol (ULTRAVATE) 0.05 % ointment, Apply 1 application topically daily as needed (psoriasis). , Disp: , Rfl:  .  ibuprofen (ADVIL,MOTRIN) 800 MG tablet, Take 1 tablet (800 mg total) by mouth every 8 (eight) hours as needed for mild pain or moderate pain., Disp: 30 tablet, Rfl: 0 .  leflunomide (ARAVA) 20 MG tablet, Take 20 mg by mouth daily. , Disp: , Rfl:  .  oxyCODONE-acetaminophen (PERCOCET) 7.5-325 MG per tablet, Take 1 tablet by mouth every 8 (eight) hours as needed for severe pain. , Disp: , Rfl:  .  pramoxine-hydrocortisone (ANALPRAM-HC) 1-1 % rectal cream, Place 1 application rectally 2 (two) times daily.  (Patient taking differently: Place 1 application rectally 2 (two) times daily as needed for hemorrhoids. ), Disp: 30 g, Rfl: 2 .  tiZANidine (ZANAFLEX) 2 MG tablet, Take 2 mg by mouth 3 (three) times daily. , Disp: , Rfl:  .  triamcinolone cream (KENALOG) 0.5 %, Apply 1 application topically 2 (two) times daily. As needed, Disp: , Rfl: 0 .  vitamin B-12 (CYANOCOBALAMIN) 1000 MCG tablet, Take 1,000 mcg by mouth daily. , Disp: , Rfl:  .  vitamin C (ASCORBIC ACID) 500 MG tablet, Take 500 mg by mouth daily. , Disp: , Rfl:  .  zolpidem (AMBIEN) 10 MG tablet, TAKE 1 TABLET BY MOUTH EVERYDAY AT BEDTIME, Disp: 30 tablet, Rfl: 3  Patient Care Team: Birdie Sons, MD as PCP - General (Family Medicine) Emmaline Kluver., MD (Rheumatology) Laurence Ferrari, [place], MD as Consulting Physician (Dermatology) Sharlet Salina, MD as Referring Physician (Physical Medicine and Rehabilitation)     Objective:    Vitals: BP 120/64   Pulse 71   Temp (!) 97.1 F (36.2 C) (Oral)   Resp 15   Ht 5\' 3"  (1.6 m)   Wt 142 lb 6.4 oz (64.6 kg)   SpO2 97%   BMI 25.23 kg/m   Physical Exam   General Appearance:    Alert, cooperative, no distress, appears stated age  Head:    Normocephalic, without obvious abnormality, atraumatic  Eyes:    PERRL, conjunctiva/corneas clear, EOM's intact, fundi    benign, both eyes  Ears:    Normal TM's and external ear canals, both ears  Nose:   Nares normal, septum midline, mucosa normal, no drainage    or sinus tenderness  Throat:   Lips, mucosa, and tongue normal; teeth and gums normal  Neck:   Supple, symmetrical, trachea midline, no adenopathy;    thyroid:  no enlargement/tenderness/nodules; no carotid   bruit or JVD  Back:     Symmetric, no curvature, ROM normal, no CVA tenderness  Lungs:     Clear to auscultation bilaterally, respirations unlabored  Chest Wall:    No tenderness or deformity   Heart:    Normal heart rate. Normal rhythm. No murmurs, rubs, or gallops.    Breast Exam:    patient declined  Abdomen:     Soft, non-tender, bowel sounds active all four quadrants,    no masses, no organomegaly  Pelvic:    not indicated; post-menopausal, no abnormal Pap smears in past  Extremities:  All extremities are intact. No cyanosis or edema  Pulses:   2+ and symmetric all extremities  Skin:   Skin color, texture, turgor normal, no rashes or lesions  Lymph nodes:   Cervical, supraclavicular, and axillary nodes normal  Neurologic:   CNII-XII intact, normal strength, sensation and reflexes    throughout    Activities of Daily Living In your present state of health, do you have any difficulty performing the following activities: 03/04/2019 09/16/2018  Hearing? N N  Vision? N N  Difficulty concentrating or making decisions? N N  Walking or climbing stairs? Y N  Comment Currently due to foot pain. -  Dressing or bathing? N N  Doing errands, shopping? N N  Preparing Food and eating ? N -  Using the Toilet? N -  In the past six months, have you accidently leaked urine? N -  Do you have problems with loss of bowel control? N -  Managing your Medications? N -  Managing your Finances? N -  Housekeeping or managing your Housekeeping? N -  Some recent data might be hidden    Fall Risk Assessment Fall Risk  03/04/2019 02/27/2018 02/26/2017 02/22/2016 01/24/2016  Falls in the past year? 0 No No No No     Depression Screen PHQ 2/9 Scores 03/04/2019 02/27/2018 02/26/2017 02/26/2017  PHQ - 2 Score 1 0 0 0  PHQ- 9 Score - - 5 -    No flowsheet data found.     Assessment & Plan:    Annual Physical Reviewed patient's Family Medical History Reviewed and updated list of patient's medical providers Assessment of cognitive impairment was done Assessed patient's functional ability Established a written schedule for health screening [place] Completed and Reviewed  Exercise Activities and Dietary recommendations Goals    . Increase water  intake     Recommend increasing water intake to 6-8 glasses a day.        Immunization History  Administered Date(s) Administered  . Influenza, High Dose Seasonal PF 02/26/2017, 05/11/2018  . PPD Test 09/11/2015  . Pneumococcal Conjugate-13 01/23/2015  . Pneumococcal Polysaccharide-23 02/22/2016  . Td 02/26/2017  . Tdap 10/07/2006  . Zoster 07/06/2012    Health Maintenance  Topic Date Due  . INFLUENZA VACCINE  02/06/2019  . DEXA SCAN  04/23/2020  . MAMMOGRAM  01/21/2021  . COLONOSCOPY  03/05/2022  . TETANUS/TDAP  02/27/2027  . Hepatitis C Screening  Completed  . PNA vac Low Risk Adult  Completed     Discussed health benefits of physical activity, and encouraged her to engage in regular exercise appropriate for her age and condition.    ------------------------------------------------------------------------------------------------------------  1. Annual physical exam   2. Psoriatic arthritis ([place])  - Comprehensive metabolic panel  3. Insomnia, unspecified type Continue prn zolpidem  4. Allergic rhinitis due to pollen, unspecified seasonality Continue allergy and fluticasone nasal spray  5. Essential hypertension Fairly well controlled.  - Comprehensive metabolic panel - Lipid panel   6. Osteopenia, unspecified location  7. Bone disorder NOS  - VITAMIN D 25 Hydroxy (Vit-D Deficiency, Fractures)  9. Rheumatoid arthritis, involving unspecified site, unspecified rheumatoid factor presence ([place]) Continue current plan of care with rheumatology  9. Cervical radiculopathy due to osteoarthritis of spine stable  10. Need for influenza vaccination  - Flu Vaccine QUAD High Dose(Fluad)  The entirety of the information documented in the History of Present Illness, Review of Systems and Physical Exam were personally obtained by me. Portions of this  information were initially documented by Minette Headland, CMA and reviewed by me for thoroughness and accuracy.     Lelon Huh, MD  [facility]

## 2019-03-10 LAB — COMPREHENSIVE METABOLIC PANEL
ALT: 32 IU/L (ref 0–32)
AST: 28 IU/L (ref 0–40)
Albumin/Globulin Ratio: 2.2 (ref 1.2–2.2)
Albumin: 4.8 g/dL (ref 3.8–4.8)
Alkaline Phosphatase: 119 IU/L — ABNORMAL HIGH (ref 39–117)
BUN/Creatinine Ratio: 15 (ref 12–28)
BUN: 14 mg/dL (ref 8–27)
Bilirubin Total: 0.6 mg/dL (ref 0.0–1.2)
CO2: 26 mmol/L (ref 20–29)
Calcium: 9.8 mg/dL (ref 8.7–10.3)
Chloride: 102 mmol/L (ref 96–106)
Creatinine, Ser: 0.95 mg/dL (ref 0.57–1.00)
GFR calc Af Amer: 71 mL/min/{1.73_m2} (ref >59)
GFR calc non Af Amer: 61 mL/min/{1.73_m2} (ref >59)
Globulin, Total: 2.2 g/dL (ref 1.5–4.5)
Glucose: 95 mg/dL (ref 65–99)
Potassium: 4.6 mmol/L (ref 3.5–5.2)
Sodium: 143 mmol/L (ref 134–144)
Total Protein: 7 g/dL (ref 6.0–8.5)

## 2019-03-10 LAB — LIPID PANEL
Chol/HDL Ratio: 2.6 ratio (ref 0.0–4.4)
Cholesterol, Total: 205 mg/dL — ABNORMAL HIGH (ref 100–199)
HDL: 80 mg/dL (ref >39)
LDL Chol Calc (NIH): 107 mg/dL — ABNORMAL HIGH (ref 0–99)
Triglycerides: 101 mg/dL (ref 0–149)
VLDL Cholesterol Cal: 18 mg/dL (ref 5–40)

## 2019-03-10 LAB — VITAMIN D 25 HYDROXY (VIT D DEFICIENCY, FRACTURES): Vit D, 25-Hydroxy: 50.4 ng/mL (ref 30.0–100.0)

## 2019-04-23 NOTE — Addendum Note (Signed)
Addended by: Birdie Sons on: 04/23/2019 07:57 AM   Modules accepted: Orders

## 2019-05-03 ENCOUNTER — Other Ambulatory Visit: Payer: Self-pay | Admitting: Family Medicine

## 2019-05-03 MED ORDER — ZOLPIDEM TARTRATE 10 MG PO TABS: ORAL_TABLET | ORAL | 3 refills | Status: DC

## 2019-05-03 NOTE — Telephone Encounter (Signed)
[facility] needs refill auth on zolpidem 10 mg.

## 2019-05-20 ENCOUNTER — Telehealth: Payer: Self-pay | Admitting: Family Medicine

## 2019-05-20 DIAGNOSIS — R928 Other abnormal and inconclusive findings on diagnostic imaging of breast: Secondary | ICD-10-CM

## 2019-05-20 NOTE — Telephone Encounter (Signed)
Per diagnostic mammogram report 01/2019 patient is due to have follow up bilateral diagnostic mammogram this month. Please advise patient it is time to schedule and have sent order to [facility].

## 2019-05-20 NOTE — Telephone Encounter (Signed)
Patient advised and agrees to have follow up mammogram.

## 2019-06-08 ENCOUNTER — Ambulatory Visit
Admission: RE | Admit: 2019-06-08 | Discharge: 2019-06-08 | Disposition: A | Discharge status: Home / Self Care | Payer: Medicare Other | Source: Ambulatory Visit | Attending: Family Medicine | Admitting: Family Medicine

## 2019-06-08 DIAGNOSIS — R928 Other abnormal and inconclusive findings on diagnostic imaging of breast: Secondary | ICD-10-CM | POA: Insufficient documentation

## 2019-07-30 ENCOUNTER — Encounter: Payer: Self-pay | Admitting: Family Medicine

## 2019-07-30 MED ORDER — PROMETHAZINE HCL 25 MG PO TABS: 25.0000 mg | ORAL_TABLET | Freq: Three times a day (TID) | ORAL | 5 refills | Status: DC | PRN

## 2019-07-30 MED ORDER — BUTALBITAL-APAP-CAFFEINE 50-325-40 MG PO CAPS: 1.0000 | ORAL_CAPSULE | Freq: Four times a day (QID) | ORAL | 5 refills | Status: DC | PRN

## 2019-08-26 ENCOUNTER — Other Ambulatory Visit: Payer: Self-pay | Admitting: Family Medicine

## 2019-09-17 ENCOUNTER — Telehealth: Payer: Self-pay | Admitting: Family Medicine

## 2019-09-17 MED ORDER — ZOLPIDEM TARTRATE 10 MG PO TABS: ORAL_TABLET | ORAL | 1 refills | Status: DC

## 2019-09-17 NOTE — Telephone Encounter (Signed)
Patient's insurance will not cover the Ambien any longer.  She has asked for a 90 day prescription to be written out so she can use this with a GoodRx card at the pharmacy of her choice. Please let patient know when the RX is ready.

## 2019-09-23 ENCOUNTER — Ambulatory Visit: Payer: Medicare Other | Admitting: Dermatology

## 2019-10-22 ENCOUNTER — Encounter: Payer: Self-pay | Admitting: Emergency Medicine

## 2019-10-22 ENCOUNTER — Emergency Department
Admission: EM | Admit: 2019-10-22 | Discharge: 2019-10-22 | Disposition: A | Discharge status: Home / Self Care | Payer: Medicare Other | Attending: Emergency Medicine | Admitting: Emergency Medicine

## 2019-10-22 ENCOUNTER — Emergency Department: Payer: Medicare Other

## 2019-10-22 ENCOUNTER — Other Ambulatory Visit: Payer: Self-pay

## 2019-10-22 DIAGNOSIS — Z79899 Other long term (current) drug therapy: Secondary | ICD-10-CM | POA: Insufficient documentation

## 2019-10-22 DIAGNOSIS — G43101 Migraine with aura, not intractable, with status migrainosus: Secondary | ICD-10-CM | POA: Insufficient documentation

## 2019-10-22 DIAGNOSIS — Z96653 Presence of artificial knee joint, bilateral: Secondary | ICD-10-CM | POA: Diagnosis not present

## 2019-10-22 DIAGNOSIS — G43001 Migraine without aura, not intractable, with status migrainosus: Secondary | ICD-10-CM

## 2019-10-22 DIAGNOSIS — I10 Essential (primary) hypertension: Secondary | ICD-10-CM | POA: Insufficient documentation

## 2019-10-22 DIAGNOSIS — Z87891 Personal history of nicotine dependence: Secondary | ICD-10-CM | POA: Diagnosis not present

## 2019-10-22 DIAGNOSIS — G43909 Migraine, unspecified, not intractable, without status migrainosus: Secondary | ICD-10-CM | POA: Diagnosis present

## 2019-10-22 DIAGNOSIS — Z7982 Long term (current) use of aspirin: Secondary | ICD-10-CM | POA: Diagnosis not present

## 2019-10-22 DIAGNOSIS — Z96611 Presence of right artificial shoulder joint: Secondary | ICD-10-CM | POA: Diagnosis not present

## 2019-10-22 MED ORDER — SODIUM CHLORIDE 0.9 % IV BOLUS
500.0000 mL | Freq: Once | INTRAVENOUS | Status: AC
  Administered 2019-10-22: 500 mL via INTRAVENOUS

## 2019-10-22 MED ORDER — KETOROLAC TROMETHAMINE 30 MG/ML IJ SOLN
15.0000 mg | Freq: Once | INTRAMUSCULAR | Status: AC
  Administered 2019-10-22: 15 mg via INTRAVENOUS
  Filled 2019-10-22: qty 1

## 2019-10-22 MED ORDER — BUTALBITAL-APAP-CAFFEINE 50-325-40 MG PO TABS
1.0000 | ORAL_TABLET | Freq: Once | ORAL | Status: AC
  Administered 2019-10-22: 1 via ORAL
  Filled 2019-10-22: qty 1

## 2019-10-22 MED ORDER — DIPHENHYDRAMINE HCL 50 MG/ML IJ SOLN
12.5000 mg | Freq: Once | INTRAMUSCULAR | Status: AC
  Administered 2019-10-22: 12.5 mg via INTRAVENOUS
  Filled 2019-10-22: qty 1

## 2019-10-22 MED ORDER — METOCLOPRAMIDE HCL 5 MG/ML IJ SOLN
10.0000 mg | Freq: Once | INTRAMUSCULAR | Status: AC
  Administered 2019-10-22: 10 mg via INTRAVENOUS
  Filled 2019-10-22: qty 2

## 2019-10-22 NOTE — ED Provider Notes (Signed)
[facility] Emergency Department Provider Note   ____________________________________________   First MD Initiated Contact with Patient 10/22/19 1142     (approximate)  I have reviewed the triage vital signs and the nursing notes.   HISTORY  Chief Complaint Migraine    HPI Meredith Russell is a 69 y.o. female patient presents with migraine headaches 3 days.  Patient said migraine is associated with photosensitivity and nausea.  Patient denies vomiting.  Patient denies vertigo or weakness.  Patient is a headaches normally controlled with Butalbital/APAP/Caffeine.  Patient took 1 dose this this morning along Phenergan.  Patient states this took the edge off the pain but is still persist.  Patient recently diagnosed with COVID-19 on 10/15/2019.  Patient is not taking a blood thinner.  Patient rates her pain at a 7/10.  Describes the pain as "achy".     Past Medical History:  Diagnosis Date  . Hemorrhoids   . Psoriasis   . Rheumatoid arthritis ([place])   . Rheumatoid arthritis [facility])     Patient Active Problem List   Diagnosis Date Noted  . Psoriatic arthritis ([place]) 08/25/2018  . Cerebrovascular disease 05/10/2016  . Allergic state 01/23/2015  . Adaptation reaction 11/22/2014  . Insomnia 11/22/2014  . Cervical radiculopathy due to osteoarthritis of spine 01/10/2014  . Carpal tunnel syndrome 01/10/2014  . DDD (degenerative disc disease), cervical 01/10/2014  . BP (high blood pressure) 02/08/2010  . Chronic infection of sinus 06/26/2009  . Allergic rhinitis 11/21/2008  . H/O total hysterectomy 11/18/2008  . History of tobacco use 11/18/2008  . Narrowing of intervertebral disc space 11/15/2008  . Migraine without aura and responsive to treatment 11/15/2008  . Arthritis or polyarthritis, rheumatoid ([place]) 11/15/2008  . Osteopenia 12/17/2006    Past Surgical History:  Procedure Laterality Date  . ABDOMINAL HYSTERECTOMY    . APPENDECTOMY    . BACK  SURGERY  2005, 2006   Lumbar and cervical fusions  . BREAST EXCISIONAL BIOPSY Right 90s   neg  . BUNIONECTOMY    . CARPAL TUNNEL RELEASE Bilateral 09/2013  . CATARACT EXTRACTION, BILATERAL    . COLONOSCOPY    . EYE SURGERY    . HEMORRHOID SURGERY N/A 09/17/2018   Procedure: HEMORRHOIDECTOMY SKIN TAG REMOVAL;  Surgeon: Benjamine Sprague, DO;  Location: [facility] ORS;  Service: General;  Laterality: N/A;  . HEMORROIDECTOMY    . JOINT REPLACEMENT    . OOPHORECTOMY     one ovary removed  . REPLACEMENT TOTAL KNEE Bilateral 2009  . TOTAL SHOULDER REPLACEMENT Right 2010    Prior to Admission medications   Medication Sig Start Date End Date Taking? Authorizing Provider  amLODipine (NORVASC) 5 MG tablet TAKE 1 TABLET BY MOUTH  DAILY 08/26/19   Birdie Sons, MD  aspirin 81 MG tablet Take 81 mg by mouth daily.  11/21/08   [provider]  Biotin 10 MG CAPS Take by mouth daily.    [provider]  Butalbital-APAP-Caffeine [phone] MG capsule Take 1-2 capsules by mouth every 6 (six) hours as needed for headache. 07/30/19   Birdie Sons, MD  calcipotriene (DOVONOX) 0.005 % cream Apply 1 application topically 3 (three) times daily as needed (psoriasis).  08/05/18   [provider]  Cholecalciferol (VITAMIN D3) 1000 units CAPS Take 1,000 Units by mouth daily.     [provider]  COSENTYX SENSOREADY, 300 MG, 150 MG/ML SOAJ Inject 300 mg into the muscle every 28 (twenty-eight) days.  02/25/18  [provider]  diclofenac (VOLTAREN) 50 MG EC tablet Take 50 mg by mouth daily.     [provider]  diclofenac sodium (VOLTAREN) 1 % GEL Apply 2 g topically daily as needed (pain).  10/02/16   [provider]  fexofenadine (ALLEGRA) 180 MG tablet Take 180 mg by mouth daily.    [provider]  fluticasone (FLONASE) 50 MCG/ACT nasal spray Place 2 sprays into both nostrils daily as needed for allergies. 10/27/18   Birdie Sons, MD  halobetasol  (ULTRAVATE) 0.05 % ointment Apply 1 application topically daily as needed (psoriasis).  09/10/17   [provider]  ibuprofen (ADVIL,MOTRIN) 800 MG tablet Take 1 tablet (800 mg total) by mouth every 8 (eight) hours as needed for mild pain or moderate pain. 09/17/18   Lysle Pearl, Isami, DO  leflunomide (ARAVA) 20 MG tablet Take 20 mg by mouth daily.     [provider]  oxyCODONE-acetaminophen (PERCOCET) 7.5-325 MG per tablet Take 1 tablet by mouth every 8 (eight) hours as needed for severe pain.  11/29/09   [provider]  pramoxine-hydrocortisone [facility]) 1-1 % rectal cream Place 1 application rectally 2 (two) times daily. Patient taking differently: Place 1 application rectally 2 (two) times daily as needed for hemorrhoids.  04/06/18   Birdie Sons, MD  promethazine (PHENERGAN) 25 MG tablet Take 1 tablet (25 mg total) by mouth every 8 (eight) hours as needed for nausea or vomiting. 07/30/19   Birdie Sons, MD  tiZANidine (ZANAFLEX) 2 MG tablet Take 2 mg by mouth 3 (three) times daily.     [provider]  triamcinolone cream (KENALOG) 0.5 % Apply 1 application topically 2 (two) times daily. As needed 09/08/17   [provider]  vitamin B-12 (CYANOCOBALAMIN) 1000 MCG tablet Take 1,000 mcg by mouth daily.     [provider]  vitamin C (ASCORBIC ACID) 500 MG tablet Take 500 mg by mouth daily.  11/15/08   [provider]  zolpidem (AMBIEN) 10 MG tablet TAKE 1 TABLET BY MOUTH EVERYDAY AT BEDTIME 09/17/19   Birdie Sons, MD    Allergies Adalimumab, Morphine and related, Pregabalin, Savella  [milnacipran], and Sulfa antibiotics  Family History  Problem Relation Age of Onset  . Heart disease Mother   . Heart attack Mother   . Heart disease Father   . Cancer Father   . Diabetes Other   . Heart disease Other   . Hyperlipidemia Other   . Hypertension Other   . Bladder Cancer Neg Hx   . Kidney cancer Neg Hx     Social  History Social History   Tobacco Use  . Smoking status: Former Smoker    Quit date: 11/22/1987    Years since quitting: 31.9  . Smokeless tobacco: Never Used  Substance Use Topics  . Alcohol use: Yes    Comment: occasional; drinks vodka and whiskey  . Drug use: No    Review of Systems Constitutional: No fever/chills Eyes: No visual changes. ENT: No sore throat. Cardiovascular: Denies chest pain. Respiratory: Denies shortness of breath. Gastrointestinal: No abdominal pain.  No nausea, no vomiting.  No diarrhea.  No constipation. Genitourinary: Negative for dysuria. Musculoskeletal: Negative for back pain. Skin: Negative for rash. Neurological: Positive for headaches, but denies focal weakness or numbness. Endocrine:  Rheumatoid arthritis Allergic/Immunilogical: Morphine and related products.  Sulfa antibiotics.  ____________________________________________   PHYSICAL EXAM:  VITAL SIGNS: ED Triage Vitals  Enc Vitals Group  BP 10/22/19 1113 120/72     Pulse Rate 10/22/19 1113 92     Resp 10/22/19 1113 18     Temp 10/22/19 1113 98.7 F (37.1 C)     Temp Source 10/22/19 1113 Oral     SpO2 10/22/19 1113 93 %     Weight 10/22/19 1115 131 lb (59.4 kg)     Height 10/22/19 1115 5' 3.75" (1.619 m)     Head Circumference --      Peak Flow --      Pain Score 10/22/19 1115 7     Pain Loc --      Pain Edu? --      Excl. in [place]? --     Constitutional: Alert and oriented. Well appearing and in no acute distress. Eyes: Conjunctivae are normal. PERRL. EOMI. Head: Atraumatic. Nose: No congestion/rhinnorhea. Mouth/Throat: Mucous membranes are moist.  Oropharynx non-erythematous. Cardiovascular: Normal rate, regular rhythm. Grossly normal heart sounds.  Good peripheral circulation. Respiratory: Normal respiratory effort.  No retractions. Lungs CTAB. Gastrointestinal: Soft and nontender. No distention. No abdominal bruits. No CVA tenderness. Neurologic:  Normal speech and  language. No gross focal neurologic deficits are appreciated. No gait instability. Skin:  Skin is warm, dry and intact. No rash noted. Psychiatric: Mood and affect are normal. Speech and behavior are normal.  ____________________________________________   LABS (all labs ordered are listed, but only abnormal results are displayed)  Labs Reviewed - No data to display ____________________________________________  EKG   ____________________________________________  RADIOLOGY  ED MD interpretation:    Official radiology report(s): CT Head Wo Contrast  Result Date: 10/22/2019 CLINICAL DATA:  Headache, acute headache with history of migraines and recent COVID-19 infection EXAM: CT HEAD WITHOUT CONTRAST TECHNIQUE: Contiguous axial images were obtained from the base of the skull through the vertex without intravenous contrast. COMPARISON:  10/31/2005 FINDINGS: Brain: No evidence of acute infarction, hemorrhage, hydrocephalus, extra-axial collection or mass lesion/mass effect. Chronic microvascular ischemic changes in the periventricular and deep white matter that are with similar distribution to but worse than on the prior study from 2007. Findings are more similar to the MRI that was obtained in 2017. Vascular: No hyperdense vessel or unexpected calcification. Skull: Normal. Negative for fracture or focal lesion. Sinuses/Orbits: No acute finding. Other: None IMPRESSION: 1. No acute intracranial abnormality. 2. Small-vessel white matter disease. Electronically Signed   By: Zetta Bills M.D.   On: 10/22/2019 12:35    ____________________________________________   PROCEDURES  Procedure(s) performed (including Critical Care):  Procedures   ____________________________________________   INITIAL IMPRESSION / ASSESSMENT AND PLAN / ED COURSE  As part of my medical decision making, I reviewed the following data within the [place]     Patient presents with persistent  migraine headache for few days.  Patient states headache normally resolves with Fioricet but this did not happen today.  Differentials consist of migraine headache, sinusitis, or intracranial abnormalities.  Discussed CT findings with patient.  Patient responded well to IV hydration, Toradol, Reglan, and Benadryl.  Patient given discharge care instruction advised to follow-up with treating doctor.  Return to ED if condition worsens.  CORRY FULWILER was evaluated in Emergency Department on 10/22/2019 for the symptoms described in the history of present illness. She was evaluated in the context of the global COVID-19 pandemic, which necessitated consideration that the patient might be at risk for infection with the SARS-CoV-2 virus that causes COVID-19. Institutional protocols and algorithms that pertain to the evaluation of  patients at risk for COVID-19 are in a state of rapid change based on information released by regulatory bodies including the CDC and federal and state organizations. These policies and algorithms were followed during the patient's care in the ED.         ____________________________________________   FINAL CLINICAL IMPRESSION(S) / ED DIAGNOSES  Final diagnoses:  Migraine without aura and with status migrainosus, not intractable     ED Discharge Orders    None       Note:  This document was prepared using Dragon voice recognition software and may include unintentional dictation errors.    Sable Feil, PA-C 10/22/19 1417    Lavonia Drafts, MD 10/22/19 1436

## 2019-10-22 NOTE — ED Triage Notes (Signed)
Hx of migraines, photosensitivity.  Covid + 10/15/19

## 2019-10-22 NOTE — Discharge Instructions (Addendum)
Follow discharge care instruction continue previous medications. °

## 2019-10-22 NOTE — ED Notes (Signed)
See triage note Presents with headache  States she has a hx of migraines   States she took some meds this am around 730 am    Min relief

## 2019-11-11 ENCOUNTER — Ambulatory Visit (INDEPENDENT_AMBULATORY_CARE_PROVIDER_SITE_OTHER): Payer: Medicare Other | Admitting: Dermatology

## 2019-11-11 ENCOUNTER — Encounter: Payer: Self-pay | Admitting: Dermatology

## 2019-11-11 ENCOUNTER — Other Ambulatory Visit: Payer: Self-pay

## 2019-11-11 DIAGNOSIS — L570 Actinic keratosis: Secondary | ICD-10-CM

## 2019-11-11 DIAGNOSIS — L814 Other melanin hyperpigmentation: Secondary | ICD-10-CM

## 2019-11-11 DIAGNOSIS — Z872 Personal history of diseases of the skin and subcutaneous tissue: Secondary | ICD-10-CM

## 2019-11-11 DIAGNOSIS — L578 Other skin changes due to chronic exposure to nonionizing radiation: Secondary | ICD-10-CM

## 2019-11-11 NOTE — Progress Notes (Signed)
   Follow-Up Visit   Subjective  Meredith Russell is a 69 y.o. female who presents for the following: AK recheck (right nasal dorsum, cryotherapy last office visit).  The following portions of the chart were reviewed this encounter and updated as appropriate:     Review of Systems:  No other skin or systemic complaints except as noted in HPI or Assessment and Plan.  Objective  Well appearing patient in no apparent distress; mood and affect are within normal limits.  A focused examination was performed including face. Relevant physical exam findings are noted in the Assessment and Plan.  Objective  Left Alar Crease: Keratotic macule anterior to L alar crease  Objective  Right nasal dorsum: No evidence of recurrence, call clinic for new or changing lesions.    Assessment & Plan  AK (actinic keratosis) Left Alar Crease  Cryotherapy today Prior to procedure, discussed risks of blister formation, small wound, skin dyspigmentation, or rare scar following cryotherapy.     Destruction of lesion - Left Alar Crease  Destruction method: cryotherapy   Informed consent: discussed and consent obtained   Lesion destroyed using liquid nitrogen: Yes   Region frozen until ice ball extended beyond lesion: Yes   Outcome: patient tolerated procedure well with no complications   Post-procedure details: wound care instructions given    History of actinic keratoses Right nasal dorsum  Clear. Observe for recurrence. Call clinic for new or changing lesions.  Recommend regular skin exams, daily broad-spectrum spf 30+ sunscreen use, and photoprotection.      Lentigines - Scattered tan macules - Discussed due to sun exposure - Benign, observe - Call for any changes  Actinic Damage - diffuse scaly erythematous macules with underlying dyspigmentation - Recommend daily broad spectrum sunscreen SPF 30+ to sun-exposed areas, reapply every 2 hours as needed.  - Call for new or changing  lesions.   Return for TBSE December, with Dr. Laurence Ferrari. Marene Lenz, CMA, am acting as scribe for Brendolyn Patty, MD .  Documentation: I have reviewed the above documentation for accuracy and completeness, and I agree with the above.  Brendolyn Patty MD

## 2019-11-11 NOTE — Patient Instructions (Addendum)
Recommend daily broad spectrum sunscreen SPF 30+ to sun-exposed areas, reapply every 2 hours as needed. Call for new or changing lesions.  Cryotherapy Aftercare  . Wash gently with soap and water everyday.   . Apply Vaseline and Band-Aid daily until healed.  

## 2019-11-15 ENCOUNTER — Other Ambulatory Visit: Payer: Self-pay | Admitting: Family Medicine

## 2019-11-15 NOTE — Telephone Encounter (Signed)
Requested Prescriptions  Pending Prescriptions Disp Refills  . amLODipine (NORVASC) 5 MG tablet [Pharmacy Med Name: AMLODIPINE  5MG   TAB] 30 tablet 0    Sig: TAKE 1 TABLET BY MOUTH  DAILY     Cardiovascular:  Calcium Channel Blockers Failed - 11/15/2019  9:22 PM      Failed - Valid encounter within last 6 months    Recent Outpatient Visits          8 months ago Annual physical exam   [facility] Birdie Sons, MD   1 year ago Annual physical exam   [facility] Birdie Sons, MD   2 years ago Essential hypertension   [facility] Birdie Sons, MD   2 years ago Essential hypertension   [facility] Birdie Sons, MD   2 years ago Annual physical exam   [place], [place]             Passed - Last BP in normal range    BP Readings from Last 1 Encounters:  10/22/19 120/70

## 2019-12-02 ENCOUNTER — Other Ambulatory Visit: Payer: Self-pay | Admitting: Family Medicine

## 2019-12-02 MED ORDER — AMLODIPINE BESYLATE 5 MG PO TABS: 5.0000 mg | ORAL_TABLET | Freq: Every day | ORAL | 3 refills | Status: DC

## 2019-12-02 NOTE — Telephone Encounter (Signed)
[facility] faxed refill request for the following medications:  amLODipine (NORVASC) 5 MG tablet   Please advise.  Thanks, [employer]

## 2019-12-02 NOTE — Telephone Encounter (Signed)
Please advise on refill request. Patient was given a 30 day courtesy refill on 11/15/2019. Her last office visit was 03/09/2019 and per last labs from that visit, patient should recheck yearly. Patient has an appointment scheduled for 03/10/2020 for a CPE and AWV. Would you like patient to come in for a follow up appointment before anymore refills are given?

## 2019-12-17 ENCOUNTER — Other Ambulatory Visit: Payer: Self-pay | Admitting: Family Medicine

## 2020-01-20 LAB — IRON,TIBC AND FERRITIN PANEL
%SAT: 5
Iron: 13
TIBC: 249.3

## 2020-01-20 LAB — TSH: TSH: 5.93 — AB (ref <=5.90)

## 2020-01-25 DIAGNOSIS — K52831 Collagenous colitis: Secondary | ICD-10-CM | POA: Insufficient documentation

## 2020-01-25 LAB — HM COLONOSCOPY

## 2020-01-28 LAB — HEPATIC FUNCTION PANEL
ALT: 7 (ref 7–35)
AST: 11 — AB (ref 13–35)
Alkaline Phosphatase: 76 (ref 25–125)
Bilirubin, Total: 0.4

## 2020-01-28 LAB — BASIC METABOLIC PANEL
BUN: 14 (ref 4–21)
CO2: 30 — AB (ref 13–22)
Chloride: 102 (ref 99–108)
Creatinine: 0.8 (ref 0.5–1.1)
Glucose: 101
Potassium: 3.9 (ref 3.4–5.3)
Sodium: 138 (ref 137–147)

## 2020-01-28 LAB — COMPREHENSIVE METABOLIC PANEL
Calcium: 8.7 (ref 8.7–10.7)
GFR calc non Af Amer: 71

## 2020-01-28 LAB — CBC AND DIFFERENTIAL
HCT: 37 (ref 36–46)
Hemoglobin: 36.7 — AB (ref 12.0–16.0)
Platelets: 420 — AB (ref 150–399)
WBC: 8.6

## 2020-01-28 LAB — CBC: RBC: 3.94 (ref 3.87–5.11)

## 2020-02-28 ENCOUNTER — Encounter: Payer: Self-pay | Admitting: Family Medicine

## 2020-03-09 NOTE — Progress Notes (Signed)
Annual Physica    Patient: Meredith Russell, Female    DOB: 07-Jul-1951, 69 y.o.   MRN: [id number] Visit Date: 03/10/2020  Today's Provider: Lelon Huh, MD   Chief Complaint  Patient presents with  . Annual Exam   Subjective    Meredith Russell is a 69 y.o. female who presents today for her Complete physical exam.  She reports consuming a general diet. Exercises regularly She generally feels well. She reports sleeping poorly. She does have additional problems to discuss today.   Anxiety Presents for initial visit. The problem has been gradually worsening. Symptoms include insomnia. Patient reports no nausea. The symptoms are aggravated by family issues (Pt's daughter has been diagnosed with dementia. ).     Follow up for Insomnia:  The patient was last seen for this 1 year ago. Changes made at last visit include none. Continue prn zolpidem  She reports excellent compliance with treatment. She feels that condition is Unchanged. She is not having side effects.   -----------------------------------------------------------------------------------------  Hypertension, follow-up  BP Readings from Last 3 Encounters:  03/10/20 (!) 160/82  10/22/19 120/70  03/09/19 120/64   Wt Readings from Last 3 Encounters:  03/10/20 123 lb (55.8 kg)  10/22/19 131 lb (59.4 kg)  03/09/19 142 lb 6.4 oz (64.6 kg)     She was last seen for hypertension 1 year ago.  BP at that visit was 120/64. Management since that visit includes continue same medication.  She reports excellent compliance with treatment. She is not having side effects.  She is following a Regular diet. She is exercising. She does not smoke.  Use of agents associated with hypertension: NSAIDS.   Outside blood pressures are . Symptoms: No chest pain No chest pressure  No palpitations No syncope  No dyspnea No orthopnea  No paroxysmal nocturnal dyspnea No lower extremity edema   Pertinent labs: Lab Results    Component Value Date   CHOL 205 (H) 03/09/2019   HDL 80 03/09/2019   LDLCALC 107 (H) 03/09/2019   TRIG 101 03/09/2019   CHOLHDL 2.6 03/09/2019   Lab Results  Component Value Date   NA 143 03/09/2019   K 4.6 03/09/2019   CREATININE 0.95 03/09/2019   GFRNONAA 61 03/09/2019   GFRAA 71 03/09/2019   GLUCOSE 95 03/09/2019     The 10-year ASCVD risk score Mikey Bussing DC Jr., et al., 2013) is: 17.9%   ---------------------------------------------------------------------------------------------------  Follow up for osteopenia:  The patient was last seen for this 1 year ago. Changes made at last visit include none.   She reports excellent compliance with treatment. She feels that condition is Unchanged. She is not having side effects.   -----------------------------------------------------------------------------------------  She has had much more anxiety lately related to her own battles with Covid and colitis, and increase demands or special needs child recently diagnosed with dementia. She is struggling to not get upset with her daughter and would like something to help with her nerves.      Medications: Outpatient Medications Prior to Visit  Medication Sig  . amLODipine (NORVASC) 5 MG tablet Take 1 tablet (5 mg total) by mouth daily.  . Biotin 10 MG CAPS Take by mouth daily.  . budesonide (ENTOCORT EC) 3 MG 24 hr capsule Take 9 mg by mouth every morning.  . Butalbital-APAP-Caffeine 50-325-40 MG capsule Take 1-2 capsules by mouth every 6 (six) hours as needed for headache.  . calcipotriene (DOVONOX) 0.005 % cream Apply 1 application topically 3 (three)  times daily as needed (psoriasis).   . Cholecalciferol (VITAMIN D3) 1000 units CAPS Take 1,000 Units by mouth daily.   . diclofenac (VOLTAREN) 50 MG EC tablet Take 50 mg by mouth daily.   . diclofenac sodium (VOLTAREN) 1 % GEL Apply 2 g topically daily as needed (pain).   Marland Kitchen etanercept (ENBREL SURECLICK) 50 MG/ML injection INJECT 50MG   SUBCUTANEOUSLY ONCE WEEKLY  AS DIRECTED.  Marland Kitchen fexofenadine (ALLEGRA) 180 MG tablet Take 180 mg by mouth daily.  . fluticasone (FLONASE) 50 MCG/ACT nasal spray USE 2 SPRAYS INTO BOTH  NOSTRILS DAILY AS NEEDED  FOR ALLERGIES  . halobetasol (ULTRAVATE) 0.05 % ointment Apply 1 application topically daily as needed (psoriasis).   Marland Kitchen leflunomide (ARAVA) 20 MG tablet Take 20 mg by mouth daily.   Marland Kitchen oxyCODONE-acetaminophen (PERCOCET) 7.5-325 MG per tablet Take 1 tablet by mouth every 8 (eight) hours as needed for severe pain.   Marland Kitchen oxyCODONE-acetaminophen (PERCOCET) 7.5-325 MG tablet SMARTSIG:1 Tablet(s) By Mouth 4 Times Daily PRN  . pramoxine-hydrocortisone (ANALPRAM-HC) 1-1 % rectal cream Place 1 application rectally 2 (two) times daily. (Patient taking differently: Place 1 application rectally 2 (two) times daily as needed for hemorrhoids. )  . predniSONE (DELTASONE) 20 MG tablet Take 20 mg by mouth 2 (two) times daily.  . promethazine (PHENERGAN) 25 MG tablet Take 1 tablet (25 mg total) by mouth every 8 (eight) hours as needed for nausea or vomiting.  Marland Kitchen tiZANidine (ZANAFLEX) 2 MG tablet Take 2 mg by mouth 3 (three) times daily.   Marland Kitchen triamcinolone cream (KENALOG) 0.5 % Apply 1 application topically 2 (two) times daily. As needed  . vitamin B-12 (CYANOCOBALAMIN) 1000 MCG tablet Take 1,000 mcg by mouth daily.   . vitamin C (ASCORBIC ACID) 500 MG tablet Take 500 mg by mouth daily.   Marland Kitchen zolpidem (AMBIEN) 10 MG tablet TAKE 1 TABLET BY MOUTH EVERYDAY AT BEDTIME  . aspirin 81 MG tablet Take 81 mg by mouth daily.  (Patient not taking: Reported on 03/10/2020)  . COSENTYX SENSOREADY, 300 MG, 150 MG/ML SOAJ Inject 300 mg into the muscle every 28 (twenty-eight) days.   Marland Kitchen ibuprofen (ADVIL,MOTRIN) 800 MG tablet Take 1 tablet (800 mg total) by mouth every 8 (eight) hours as needed for mild pain or moderate pain.  Marland Kitchen omeprazole (PRILOSEC) 20 MG capsule Take by mouth.   No facility-administered medications prior to visit.      Allergies  Allergen Reactions  . Adalimumab Itching and Rash    Psoriasis  . Morphine Other (See Comments)    Tachycardia  . Morphine And Related Other (See Comments)    Tachycardia  . Pregabalin Other (See Comments)    Increased BP   . Savella  [Milnacipran] Rash  . Sulfa Antibiotics Nausea And Vomiting    Patient Care Team: Birdie Sons, MD as PCP - General (Family Medicine) Emmaline Kluver., MD (Rheumatology) Alfonso Patten, MD as Consulting Physician (Dermatology) Sharlet Salina, MD as Referring Physician (Physical Medicine and Rehabilitation) Efrain Sella, MD as Consulting Physician (Gastroenterology)  Review of Systems  Constitutional: Negative.   HENT: Positive for sneezing. Negative for congestion, dental problem, drooling, ear discharge, ear pain, facial swelling, hearing loss, mouth sores, nosebleeds, postnasal drip, rhinorrhea, sinus pressure, sinus pain, sore throat, tinnitus, trouble swallowing and voice change.   Eyes: Positive for itching. Negative for photophobia, pain, discharge, redness and visual disturbance.  Respiratory: Negative.   Cardiovascular: Negative.   Gastrointestinal: Positive for abdominal pain. Negative for abdominal distention,  anal bleeding, blood in stool, constipation, diarrhea, nausea, rectal pain and vomiting.  Endocrine: Positive for cold intolerance. Negative for heat intolerance, polydipsia, polyphagia and polyuria.  Genitourinary: Negative.   Musculoskeletal: Positive for arthralgias, back pain, joint swelling and neck pain. Negative for gait problem, myalgias and neck stiffness.  Skin: Negative.   Allergic/Immunologic: Negative.   Neurological: Negative.   Hematological: Negative.   Psychiatric/Behavioral: The patient has insomnia.       Objective    Vitals: BP (!) 160/82 (BP Location: Left Arm, Patient Position: Sitting, Cuff Size: Large)   Pulse 62   Temp 97.9 F (36.6 C) (Oral)   Ht 5\' 4"  (1.626 m)   Wt  123 lb (55.8 kg)   SpO2 99%   BMI 21.11 kg/m    Physical Exam  General Appearance:    Well developed, well nourished female. Alert, cooperative, in no acute distress, appears stated age   Head:    Normocephalic, without obvious abnormality, atraumatic  Eyes:    PERRL, conjunctiva/corneas clear, EOM's intact, fundi    benign, both eyes  Ears:    Normal TM's and external ear canals, both ears  Nose:   Nares normal, septum midline, mucosa normal, no drainage    or sinus tenderness  Throat:   Lips, mucosa, and tongue normal; teeth and gums normal  Neck:   Supple, symmetrical, trachea midline, no adenopathy;    thyroid:  no enlargement/tenderness/nodules; no carotid   bruit or JVD  Back:     Symmetric, no curvature, ROM normal, no CVA tenderness  Lungs:     Clear to auscultation bilaterally, respirations unlabored  Chest Wall:    No tenderness or deformity   Heart:    Normal heart rate. Normal rhythm. No murmurs, rubs, or gallops.   Breast Exam:    normal appearance, no masses or tenderness  Abdomen:     Soft, non-tender, bowel sounds active all four quadrants,    no masses, no organomegaly  Pelvic:    deferred  Extremities:   All extremities are intact. No cyanosis or edema  Pulses:   2+ and symmetric all extremities  Skin:   Skin color, texture, turgor normal, no rashes or lesions  Lymph nodes:   Cervical, supraclavicular, and axillary nodes normal  Neurologic:   CNII-XII intact, normal strength, sensation and reflexes    throughout      Most recent Audit-C alcohol use screening Alcohol Use Disorder Test (AUDIT) 03/10/2020  1. How often do you have a drink containing alcohol? 1  2. How many drinks containing alcohol do you have on a typical day when you are drinking? 0  3. How often do you have six or more drinks on one occasion? 0  AUDIT-C Score 1  Alcohol Brief Interventions/Follow-up AUDIT Score <7 follow-up not indicated   A score of 3 or more in women, and 4 or more in men  indicates increased risk for alcohol abuse, EXCEPT if all of the points are from question 1   No results found for any visits on 03/10/20.  Assessment & Plan     1. Annual physical exam Unremarkable exam. Follow up rheumatology and GI as scheduled for colitis and RA.PA   2. Psoriatic arthritis ([place]) On chronic immunosuppressants.   3. Insomnia, unspecified type Refill zolpidem.   4. Essential hypertension Up today due to anxiety. Continue current medications for now.  - EKG 12-Lead  5. Immunocompromised state due to drug therapy Counseled on recommendations for Covid vaccines  in patient's who have had Covid and patients who are immunocompromised. Her Covid test was positive in July, so she should currently be well protected. But counseled she should get vaccine 3-6 months after she first tested positive.   6. Situational stress/anxiety Worse lately due to stress of caring for special needs daughter with dementia. She would just like sometime to take occasionally as needed when she gets stressed.  Rx - ALPRAZolam (XANAX) 0.5 MG tablet; Take 1 tablet (0.5 mg total) by mouth every 4 (four) hours as needed.  Dispense: 30 tablet; Refill: 1   7. Allergic rhinitis She is taking fexofenadine which remains effects, but she would like prescription which was written today.   - fexofenadine (ALLEGRA) 180 MG tablet; Take 1 tablet (180 mg total) by mouth daily.  Dispense: 90 tablet; Refill: 4     The entirety of the information documented in the History of Present Illness, Review of Systems and Physical Exam were personally obtained by me. Portions of this information were initially documented by the CMA and reviewed by me for thoroughness and accuracy.      Lelon Huh, MD  [facility] [phone] (phone) [phone] (fax)  [place]

## 2020-03-10 ENCOUNTER — Encounter: Payer: Self-pay | Admitting: Family Medicine

## 2020-03-10 ENCOUNTER — Ambulatory Visit (INDEPENDENT_AMBULATORY_CARE_PROVIDER_SITE_OTHER): Payer: Medicare Other | Admitting: Family Medicine

## 2020-03-10 ENCOUNTER — Other Ambulatory Visit: Payer: Self-pay

## 2020-03-10 VITALS — BP 144/68 | HR 62 | Temp 97.9°F | Ht 64.0 in | Wt 123.0 lb

## 2020-03-10 DIAGNOSIS — D84821 Immunodeficiency due to drugs: Secondary | ICD-10-CM | POA: Insufficient documentation

## 2020-03-10 DIAGNOSIS — G4700 Insomnia, unspecified: Secondary | ICD-10-CM

## 2020-03-10 DIAGNOSIS — L4050 Arthropathic psoriasis, unspecified: Secondary | ICD-10-CM

## 2020-03-10 DIAGNOSIS — I10 Essential (primary) hypertension: Secondary | ICD-10-CM

## 2020-03-10 DIAGNOSIS — Z79899 Other long term (current) drug therapy: Secondary | ICD-10-CM

## 2020-03-10 DIAGNOSIS — Z0000 Encounter for general adult medical examination without abnormal findings: Secondary | ICD-10-CM

## 2020-03-10 DIAGNOSIS — F439 Reaction to severe stress, unspecified: Secondary | ICD-10-CM

## 2020-03-10 MED ORDER — ALPRAZOLAM 0.5 MG PO TABS
0.5000 mg | ORAL_TABLET | ORAL | 1 refills | Status: DC | PRN
Start: 2020-03-10 — End: 2020-06-19

## 2020-03-10 MED ORDER — FEXOFENADINE HCL 180 MG PO TABS
180.0000 mg | ORAL_TABLET | Freq: Every day | ORAL | 4 refills | Status: DC
Start: 2020-03-10 — End: 2021-04-28

## 2020-03-10 MED ORDER — ALPRAZOLAM 0.5 MG PO TABS: 0.5000 mg | ORAL_TABLET | ORAL | 1 refills | Status: DC | PRN

## 2020-03-10 MED ORDER — ZOLPIDEM TARTRATE 10 MG PO TABS
ORAL_TABLET | ORAL | 1 refills | Status: DC
Start: 2020-03-10 — End: 2021-03-21

## 2020-03-10 NOTE — Progress Notes (Signed)
Annual Wellness Visit     Patient: Meredith Russell, Female    DOB: [birth year], 69 y.o.   MRN: [id number] Visit Date: 03/10/2020  Today's Provider: Lelon Huh, MD   Chief Complaint  Patient presents with  . Annual Exam   Subjective    Meredith Russell is a 69 y.o. female who presents today for her Annual Wellness Visit.   HPI   Patient Active Problem List   Diagnosis Date Noted  . Collagenous colitis 01/25/2020  . Psoriatic arthritis ([place]) 08/25/2018  . Cerebrovascular disease 05/10/2016  . Allergic state 01/23/2015  . Adaptation reaction 11/22/2014  . Insomnia 11/22/2014  . Cervical radiculopathy due to osteoarthritis of spine 01/10/2014  . Carpal tunnel syndrome 01/10/2014  . DDD (degenerative disc disease), cervical 01/10/2014  . BP (high blood pressure) 02/08/2010  . Chronic infection of sinus 06/26/2009  . Allergic rhinitis 11/21/2008  . H/O total hysterectomy 11/18/2008  . History of tobacco use 11/18/2008  . Narrowing of intervertebral disc space 11/15/2008  . Migraine without aura and responsive to treatment 11/15/2008  . Arthritis or polyarthritis, rheumatoid ([place]) 11/15/2008  . Osteopenia 12/17/2006   Past Medical History:  Diagnosis Date  . Hemorrhoids   . Psoriasis   . Rheumatoid arthritis ([place])   . Rheumatoid arthritis [facility])    Past Surgical History:  Procedure Laterality Date  . ABDOMINAL HYSTERECTOMY    . APPENDECTOMY    . BACK SURGERY  2005, 2006   Lumbar and cervical fusions  . BREAST EXCISIONAL BIOPSY Right 90s   neg  . BUNIONECTOMY    . CARPAL TUNNEL RELEASE Bilateral 09/2013  . CATARACT EXTRACTION, BILATERAL    . COLONOSCOPY    . EYE SURGERY    . HEMORRHOID SURGERY N/A 09/17/2018   Procedure: HEMORRHOIDECTOMY SKIN TAG REMOVAL;  Surgeon: Benjamine Sprague, DO;  Location: [facility] ORS;  Service: General;  Laterality: N/A;  . HEMORROIDECTOMY    . JOINT REPLACEMENT    . OOPHORECTOMY     one ovary removed  . REPLACEMENT TOTAL KNEE Bilateral  2009  . TOTAL SHOULDER REPLACEMENT Right 2010   Social History   Tobacco Use  . Smoking status: Former Smoker    Quit date: 11/22/1987    Years since quitting: 32.3  . Smokeless tobacco: Never Used  Vaping Use  . Vaping Use: Never used  Substance Use Topics  . Alcohol use: Yes    Comment: occasional; drinks vodka and whiskey  . Drug use: No   Social History   Socioeconomic History  . Marital status: Married    Spouse name: Meredith Russell  . Number of children: 2  . Years of education: Not on file  . Highest education level: High school graduate  Occupational History  . Occupation: retired  Tobacco Use  . Smoking status: Former Smoker    Quit date: 11/22/1987    Years since quitting: 32.3  . Smokeless tobacco: Never Used  Vaping Use  . Vaping Use: Never used  Substance and Sexual Activity  . Alcohol use: Yes    Comment: occasional; drinks vodka and whiskey  . Drug use: No  . Sexual activity: Not on file  Other Topics Concern  . Not on file  Social History Narrative  . Not on file   Social Determinants of Health   Financial Resource Strain:   . Difficulty of Paying Living Expenses: Not on file  Food Insecurity:   . Worried About Charity fundraiser in the Last Year:  Not on file  . Ran Out of Food in the Last Year: Not on file  Transportation Needs:   . Lack of Transportation (Medical): Not on file  . Lack of Transportation (Non-Medical): Not on file  Physical Activity:   . Days of Exercise per Week: Not on file  . Minutes of Exercise per Session: Not on file  Stress:   . Feeling of Stress : Not on file  Social Connections:   . Frequency of Communication with Friends and Family: Not on file  . Frequency of Social Gatherings with Friends and Family: Not on file  . Attends Religious Services: Not on file  . Active Member of Clubs or Organizations: Not on file  . Attends Archivist Meetings: Not on file  . Marital Status: Not on file  Intimate Partner  Violence:   . Fear of Current or Ex-Partner: Not on file  . Emotionally Abused: Not on file  . Physically Abused: Not on file  . Sexually Abused: Not on file   Family Status  Relation Name Status  . Mother  Deceased  . Father  Deceased  . Other  (Not Specified)  . Neg Hx  (Not Specified)   Family History  Problem Relation Age of Onset  . Heart disease Mother   . Heart attack Mother   . Heart disease Father   . Cancer Father   . Diabetes Other   . Heart disease Other   . Hyperlipidemia Other   . Hypertension Other   . Bladder Cancer Neg Hx   . Kidney cancer Neg Hx    Allergies  Allergen Reactions  . Adalimumab Itching and Rash    Psoriasis  . Morphine Other (See Comments)    Tachycardia  . Morphine And Related Other (See Comments)    Tachycardia  . Pregabalin Other (See Comments)    Increased BP   . Savella  [Milnacipran] Rash  . Sulfa Antibiotics Nausea And Vomiting       Medications: Outpatient Medications Prior to Visit  Medication Sig Note  . amLODipine (NORVASC) 5 MG tablet Take 1 tablet (5 mg total) by mouth daily.   . Biotin 10 MG CAPS Take by mouth daily.   . budesonide (ENTOCORT EC) 3 MG 24 hr capsule Take 9 mg by mouth every morning.   . Butalbital-APAP-Caffeine 50-325-40 MG capsule Take 1-2 capsules by mouth every 6 (six) hours as needed for headache.   . calcipotriene (DOVONOX) 0.005 % cream Apply 1 application topically 3 (three) times daily as needed (psoriasis).    . Cholecalciferol (VITAMIN D3) 1000 units CAPS Take 1,000 Units by mouth daily.    . diclofenac (VOLTAREN) 50 MG EC tablet Take 50 mg by mouth daily.    . diclofenac sodium (VOLTAREN) 1 % GEL Apply 2 g topically daily as needed (pain).    Marland Kitchen etanercept (ENBREL SURECLICK) 50 MG/ML injection INJECT 50MG  SUBCUTANEOUSLY ONCE WEEKLY  AS DIRECTED.   . fluticasone (FLONASE) 50 MCG/ACT nasal spray USE 2 SPRAYS INTO BOTH  NOSTRILS DAILY AS NEEDED  FOR ALLERGIES   . halobetasol (ULTRAVATE) 0.05 %  ointment Apply 1 application topically daily as needed (psoriasis).    Marland Kitchen leflunomide (ARAVA) 20 MG tablet Take 20 mg by mouth daily.    Marland Kitchen oxyCODONE-acetaminophen (PERCOCET) 7.5-325 MG per tablet Take 1 tablet by mouth every 6 (six) hours as needed for severe pain.    . pramoxine-hydrocortisone (ANALPRAM-HC) 1-1 % rectal cream Place 1 application rectally 2 (two)  times daily. (Patient taking differently: Place 1 application rectally 2 (two) times daily as needed for hemorrhoids. )   . promethazine (PHENERGAN) 25 MG tablet Take 1 tablet (25 mg total) by mouth every 8 (eight) hours as needed for nausea or vomiting.   Marland Kitchen tiZANidine (ZANAFLEX) 2 MG tablet Take 2 mg by mouth 3 (three) times daily.    Marland Kitchen triamcinolone cream (KENALOG) 0.5 % Apply 1 application topically 2 (two) times daily. As needed   . vitamin B-12 (CYANOCOBALAMIN) 1000 MCG tablet Take 1,000 mcg by mouth daily.    . vitamin C (ASCORBIC ACID) 500 MG tablet Take 500 mg by mouth daily.    . [DISCONTINUED] fexofenadine (ALLEGRA) 180 MG tablet Take 180 mg by mouth daily.   . [DISCONTINUED] oxyCODONE-acetaminophen (PERCOCET) 7.5-325 MG tablet SMARTSIG:1 Tablet(s) By Mouth 4 Times Daily PRN   . [DISCONTINUED] predniSONE (DELTASONE) 20 MG tablet Take 20 mg by mouth 2 (two) times daily.   . [DISCONTINUED] zolpidem (AMBIEN) 10 MG tablet TAKE 1 TABLET BY MOUTH EVERYDAY AT BEDTIME   . aspirin 81 MG tablet Take 81 mg by mouth daily.  (Patient not taking: Reported on 03/10/2020)   . [DISCONTINUED] COSENTYX SENSOREADY, 300 MG, 150 MG/ML SOAJ Inject 300 mg into the muscle every 28 (twenty-eight) days.  03/10/2020: was changed by rheumatology to [facility]  . [DISCONTINUED] ibuprofen (ADVIL,MOTRIN) 800 MG tablet Take 1 tablet (800 mg total) by mouth every 8 (eight) hours as needed for mild pain or moderate pain.   . [DISCONTINUED] omeprazole (PRILOSEC) 20 MG capsule Take by mouth.    No facility-administered medications prior to visit.    Allergies  Allergen  Reactions  . Adalimumab Itching and Rash    Psoriasis  . Morphine Other (See Comments)    Tachycardia  . Morphine And Related Other (See Comments)    Tachycardia  . Pregabalin Other (See Comments)    Increased BP   . Savella  [Milnacipran] Rash  . Sulfa Antibiotics Nausea And Vomiting    Patient Care Team: Birdie Sons, MD as PCP - General (Family Medicine) Emmaline Kluver., MD (Rheumatology) Alfonso Patten, MD as Consulting Physician (Dermatology) Sharlet Salina, MD as Referring Physician (Physical Medicine and Rehabilitation) Efrain Sella, MD as Consulting Physician (Gastroenterology)  Review of Systems    Objective        Most recent functional status assessment: In your present state of health, do you have any difficulty performing the following activities: 03/10/2020  Hearing? N  Vision? N  Difficulty concentrating or making decisions? N  Walking or climbing stairs? N  Dressing or bathing? N  Doing errands, shopping? N  Some recent data might be hidden   Most recent fall risk assessment: Fall Risk  03/10/2020  Falls in the past year? 0  Number falls in past yr: 0  Injury with Fall? 0  Follow up Falls evaluation completed    Most recent depression screenings: PHQ 2/9 Scores 03/10/2020 03/04/2019  PHQ - 2 Score 0 1  PHQ- 9 Score 4 -   Most recent cognitive screening: No flowsheet data found. Most recent Audit-C alcohol use screening Alcohol Use Disorder Test (AUDIT) 03/10/2020  1. How often do you have a drink containing alcohol? 1  2. How many drinks containing alcohol do you have on a typical day when you are drinking? 0  3. How often do you have six or more drinks on one occasion? 0  AUDIT-C Score 1  Alcohol Brief Interventions/Follow-up AUDIT  Score <7 follow-up not indicated   A score of 3 or more in women, and 4 or more in men indicates increased risk for alcohol abuse, EXCEPT if all of the points are from question 1   No results found  for any visits on 03/10/20.  Assessment & Plan     Annual wellness visit done today including the all of the following: Reviewed patient's Family Medical History Reviewed and updated list of patient's medical providers Assessment of cognitive impairment was done Assessed patient's functional ability Established a written schedule for health screening [place] Completed and Reviewed  Exercise Activities and Dietary recommendations Goals    . Increase water intake     Recommend increasing water intake to 6-8 glasses a day.        Immunization History  Administered Date(s) Administered  . Fluad Quad(high Dose 65+) 03/09/2019  . Influenza, High Dose Seasonal PF 02/26/2017, 05/11/2018  . PPD Test 09/11/2015  . Pneumococcal Conjugate-13 01/23/2015  . Pneumococcal Polysaccharide-23 02/22/2016  . Td 02/26/2017  . Tdap 10/07/2006  . Zoster 07/06/2012    Health Maintenance  Topic Date Due  . COVID-19 Vaccine (1) Never done  . INFLUENZA VACCINE  02/06/2020  . DEXA SCAN  04/23/2020  . MAMMOGRAM  06/07/2021  . COLONOSCOPY  03/05/2022  . TETANUS/TDAP  02/27/2027  . Hepatitis C Screening  Completed  . PNA vac Low Risk Adult  Completed     Discussed health benefits of physical activity, and encouraged her to engage in regular exercise appropriate for her age and condition.           The entirety of the information documented in the History of Present Illness, Review of Systems and Physical Exam were personally obtained by me. Portions of this information were initially documented by the CMA and reviewed by me for thoroughness and accuracy.      Lelon Huh, MD  [facility] [phone] (phone) [phone] (fax)  [place]

## 2020-05-08 ENCOUNTER — Other Ambulatory Visit: Payer: Self-pay | Admitting: Physical Medicine and Rehabilitation

## 2020-05-08 ENCOUNTER — Telehealth: Payer: Self-pay | Admitting: Family Medicine

## 2020-05-08 ENCOUNTER — Other Ambulatory Visit: Payer: Self-pay | Admitting: Family Medicine

## 2020-05-08 DIAGNOSIS — M5442 Lumbago with sciatica, left side: Secondary | ICD-10-CM

## 2020-05-08 DIAGNOSIS — R921 Mammographic calcification found on diagnostic imaging of breast: Secondary | ICD-10-CM

## 2020-05-08 NOTE — Telephone Encounter (Signed)
Patient called to check the status of a referral that she requested for her breast exam.  She stated she had talked to the doctor about it a few weeks ago but the exam office said they did not receive a referral.  Please advise and call patient to discuss if necessary at [phone] (

## 2020-05-09 NOTE — Telephone Encounter (Signed)
Patient is referring  to her mammogram. She reports that when she called to make an appt, she was unable to because an order was not placed. Ok to place order for mammogram? Please advise. Thanks!

## 2020-05-09 NOTE — Telephone Encounter (Signed)
It looks like an order was placed yesterday.

## 2020-05-09 NOTE — Telephone Encounter (Signed)
Order has been signed by Dr. Caryn Section. I called [place] and spoke with Melissa. She received the signed order and advised me that patient can call to schedule her mammogram appt now. Patient advised.

## 2020-05-18 ENCOUNTER — Ambulatory Visit: Payer: Medicare Other

## 2020-05-19 ENCOUNTER — Other Ambulatory Visit: Payer: Self-pay

## 2020-05-19 ENCOUNTER — Ambulatory Visit
Admission: RE | Admit: 2020-05-19 | Discharge: 2020-05-19 | Disposition: A | Discharge status: Home / Self Care | Payer: Medicare Other | Source: Ambulatory Visit | Attending: Physical Medicine and Rehabilitation | Admitting: Physical Medicine and Rehabilitation

## 2020-05-19 DIAGNOSIS — M5442 Lumbago with sciatica, left side: Secondary | ICD-10-CM

## 2020-06-14 ENCOUNTER — Other Ambulatory Visit: Payer: Self-pay

## 2020-06-14 ENCOUNTER — Ambulatory Visit
Admission: RE | Admit: 2020-06-14 | Discharge: 2020-06-14 | Disposition: A | Discharge status: Home / Self Care | Payer: Medicare Other | Source: Ambulatory Visit | Attending: Family Medicine | Admitting: Family Medicine

## 2020-06-14 DIAGNOSIS — R921 Mammographic calcification found on diagnostic imaging of breast: Secondary | ICD-10-CM | POA: Insufficient documentation

## 2020-06-18 ENCOUNTER — Other Ambulatory Visit: Payer: Self-pay | Admitting: Family Medicine

## 2020-06-19 NOTE — Telephone Encounter (Signed)
Requested medication (s) are due for refill today: yes   Requested medication (s) are on the active medication list: yes   Last refill:  04/12/2020  Future visit scheduled: no  Notes to clinic:  this refill cannot be delegated    Requested Prescriptions  Pending Prescriptions Disp Refills   ALPRAZolam (XANAX) 0.5 MG tablet [Pharmacy Med Name: ALPRAZOLAM 0.5 MG TABLET] 30 tablet 1    Sig: Take 1 tablet (0.5 mg total) by mouth every 4 (four) hours as needed.      Not Delegated - Psychiatry:  Anxiolytics/Hypnotics Failed - 06/18/2020  4:13 PM      Failed - This refill cannot be delegated      Failed - Urine Drug Screen completed in last 360 days      Failed - Valid encounter within last 6 months    Recent Outpatient Visits           3 months ago Annual physical exam   [facility] Birdie Sons, MD   1 year ago Annual physical exam   [facility] Birdie Sons, MD   2 years ago Annual physical exam   [facility] Birdie Sons, MD   2 years ago Essential hypertension   [facility] Birdie Sons, MD   2 years ago Essential hypertension   [facility] Birdie Sons, MD

## 2020-06-21 ENCOUNTER — Encounter: Payer: Self-pay | Admitting: Dermatology

## 2020-06-21 ENCOUNTER — Ambulatory Visit (INDEPENDENT_AMBULATORY_CARE_PROVIDER_SITE_OTHER): Payer: Medicare Other | Admitting: Dermatology

## 2020-06-21 ENCOUNTER — Other Ambulatory Visit: Payer: Self-pay

## 2020-06-21 DIAGNOSIS — D1800 Hemangioma unspecified site: Secondary | ICD-10-CM

## 2020-06-21 DIAGNOSIS — Z1283 Encounter for screening for malignant neoplasm of skin: Secondary | ICD-10-CM | POA: Diagnosis not present

## 2020-06-21 DIAGNOSIS — L578 Other skin changes due to chronic exposure to nonionizing radiation: Secondary | ICD-10-CM

## 2020-06-21 DIAGNOSIS — L821 Other seborrheic keratosis: Secondary | ICD-10-CM | POA: Diagnosis not present

## 2020-06-21 DIAGNOSIS — L720 Epidermal cyst: Secondary | ICD-10-CM | POA: Diagnosis not present

## 2020-06-21 DIAGNOSIS — D229 Melanocytic nevi, unspecified: Secondary | ICD-10-CM

## 2020-06-21 DIAGNOSIS — L570 Actinic keratosis: Secondary | ICD-10-CM

## 2020-06-21 DIAGNOSIS — L814 Other melanin hyperpigmentation: Secondary | ICD-10-CM

## 2020-06-21 NOTE — Progress Notes (Signed)
   Follow-Up Visit   Subjective  Meredith Russell is a 69 y.o. female who presents for the following: UBSE (Patient here today for UBSE. No history of skin cancer, patient not aware of anything new or changing.). She does not a spot at her right back present for awhile that sometimes drains.  Patient accompanied by husband and daughter.   The following portions of the chart were reviewed this encounter and updated as appropriate:   Tobacco  Allergies  Meds  Problems  Med Hx  Surg Hx  Fam Hx      Review of Systems:  No other skin or systemic complaints except as noted in HPI or Assessment and Plan.  Objective  Well appearing patient in no apparent distress; mood and affect are within normal limits.  All skin waist up examined.  Objective  Right Mid Back: Subcutaneous papule.   Objective  Nose: Erythematous thin papules/macules with gritty scale.    Assessment & Plan  Epidermal inclusion cyst Right Mid Back  Benign-appearing. Exam most consistent with an epidermal inclusion cyst. Discussed that a cyst is a benign growth that can grow over time and sometimes get irritated or inflamed. Recommend observation if it is not bothersome. Please call for new or changing lesions so they can be evaluated.      AK (actinic keratosis) Nose  Prior to procedure, discussed risks of blister formation, small wound, skin dyspigmentation, or rare scar following cryotherapy.    Destruction of lesion - Nose Complexity: simple   Destruction method: cryotherapy   Informed consent: discussed and consent obtained   Lesion destroyed using liquid nitrogen: Yes   Cryotherapy cycles:  2 Outcome: patient tolerated procedure well with no complications   Post-procedure details: wound care instructions given     Lentigines - Scattered tan macules - Discussed due to sun exposure - Benign, observe - Call for any changes  Seborrheic Keratoses - Stuck-on, waxy, tan-brown papules and plaques   - Discussed benign etiology and prognosis. - Observe - Call for any changes  Melanocytic Nevi - Tan-brown and/or pink-flesh-colored symmetric macules and papules - Benign appearing on exam today - Observation - Call clinic for new or changing moles - Recommend daily use of broad spectrum spf 30+ sunscreen to sun-exposed areas.   Hemangiomas - Red papules - Discussed benign nature - Observe - Call for any changes  Actinic Damage - Chronic, secondary to cumulative UV/sun exposure - diffuse scaly erythematous macules with underlying dyspigmentation - Recommend daily broad spectrum sunscreen SPF 30+ to sun-exposed areas, reapply every 2 hours as needed.  - Call for new or changing lesions.  Skin cancer screening performed today.   Return in about 2 months (around 08/22/2020) for AK follow up, 1 year TBSE.  Graciella Belton, RMA, am acting as scribe for Forest Gleason, MD .  Documentation: I have reviewed the above documentation for accuracy and completeness, and I agree with the above.  Forest Gleason, MD

## 2020-06-21 NOTE — Patient Instructions (Addendum)
Melanoma ABCDEs  Melanoma is the most dangerous type of skin cancer, and is the leading cause of death from skin disease.  You are more likely to develop melanoma if you:  Have light-colored skin, light-colored eyes, or red or blond hair  Spend a lot of time in the sun  Tan regularly, either outdoors or in a tanning bed  Have had blistering sunburns, especially during childhood  Have a close family member who has had a melanoma  Have atypical moles or large birthmarks  Early detection of melanoma is key since treatment is typically straightforward and cure rates are extremely high if we catch it early.   The first sign of melanoma is often a change in a mole or a new dark spot.  The ABCDE system is a way of remembering the signs of melanoma.  A for asymmetry:  The two halves do not match. B for border:  The edges of the growth are irregular. C for color:  A mixture of colors are present instead of an even brown color. D for diameter:  Melanomas are usually (but not always) greater than 6mm - the size of a pencil eraser. E for evolution:  The spot keeps changing in size, shape, and color.  Please check your skin once per month between visits. You can use a small mirror in front and a large mirror behind you to keep an eye on the back side or your body.   If you see any new or changing lesions before your next follow-up, please call to schedule a visit.  Please continue daily skin protection including broad spectrum sunscreen SPF 30+ to sun-exposed areas, reapplying every 2 hours as needed when you're outdoors.   Cryotherapy Aftercare  . Wash gently with soap and water everyday.   . Apply Vaseline and Band-Aid daily until healed.  Prior to procedure, discussed risks of blister formation, small wound, skin dyspigmentation, or rare scar following cryotherapy.   

## 2020-08-22 ENCOUNTER — Encounter: Payer: Self-pay | Admitting: Dermatology

## 2020-08-22 ENCOUNTER — Other Ambulatory Visit: Payer: Self-pay

## 2020-08-22 ENCOUNTER — Ambulatory Visit (INDEPENDENT_AMBULATORY_CARE_PROVIDER_SITE_OTHER): Payer: Medicare Other | Admitting: Dermatology

## 2020-08-22 DIAGNOSIS — Z872 Personal history of diseases of the skin and subcutaneous tissue: Secondary | ICD-10-CM | POA: Diagnosis not present

## 2020-08-22 NOTE — Patient Instructions (Signed)
Recommend daily broad spectrum sunscreen SPF 30+ to sun-exposed areas, reapply every 2 hours as needed. Call for new or changing lesions.  

## 2020-08-22 NOTE — Progress Notes (Signed)
   Follow-Up Visit   Subjective  Meredith Russell is a 70 y.o. female who presents for the following: Actinic Keratosis (2 months f/u on AK treated with LN2 on the nose).  Husband and daughter with pt   The following portions of the chart were reviewed this encounter and updated as appropriate:   Tobacco  Allergies  Meds  Problems  Med Hx  Surg Hx  Fam Hx      Review of Systems:  No other skin or systemic complaints except as noted in HPI or Assessment and Plan.  Objective  Well appearing patient in no apparent distress; mood and affect are within normal limits.  A focused examination was performed including face. Relevant physical exam findings are noted in the Assessment and Plan.  Objective  Nose: Clear skin    Assessment & Plan  Hx of actinic keratosis Nose  The patient will observe, watch for any sign of recurrence  Recommend daily broad spectrum sunscreen SPF 30+ to sun-exposed areas, reapply every 2 hours as needed. Call for new or changing lesions.     Return for  return in December 2022 for TBSE .  I, Marye Round, CMA, am acting as scribe for Forest Gleason, MD .  Documentation: I have reviewed the above documentation for accuracy and completeness, and I agree with the above.  Forest Gleason, MD

## 2020-08-30 ENCOUNTER — Ambulatory Visit: Payer: Medicare Other | Admitting: Dermatology

## 2020-09-22 ENCOUNTER — Other Ambulatory Visit: Payer: Self-pay | Admitting: Family Medicine

## 2020-09-22 NOTE — Telephone Encounter (Signed)
Requested medication (s) are due for refill today: yes  Requested medication (s) are on the active medication list: yes   Last refill:  06/19/20 #30 2 refills   Future visit scheduled: no  Notes to clinic:  not delegated per protocol, called patient to schedule appt . Patient states she will need to call back. Patient reports she still has medication left and medication makes her very sleep. Encouraged patient to call back for appt to discuss with PCP if dose is to high.     Requested Prescriptions  Pending Prescriptions Disp Refills   ALPRAZolam (XANAX) 0.5 MG tablet [Pharmacy Med Name: ALPRAZOLAM 0.5 MG TABLET] 30 tablet 2    Sig: TAKE 1 TABLET BY MOUTH EVERY 4 HOURS AS NEEDED      Not Delegated - Psychiatry:  Anxiolytics/Hypnotics Failed - 09/22/2020 11:25 AM      Failed - This refill cannot be delegated      Failed - Urine Drug Screen completed in last 360 days      Failed - Valid encounter within last 6 months    Recent Outpatient Visits           6 months ago Annual physical exam   [facility] Birdie Sons, MD   1 year ago Annual physical exam   [facility] Birdie Sons, MD   2 years ago Annual physical exam   [facility] Birdie Sons, MD   3 years ago Essential hypertension   [facility] Birdie Sons, MD   3 years ago Essential hypertension   [facility] Birdie Sons, MD

## 2020-10-04 ENCOUNTER — Other Ambulatory Visit: Payer: Self-pay | Admitting: Family Medicine

## 2020-10-04 NOTE — Telephone Encounter (Signed)
Patient needs office visit - Courtesy refill. Requested Prescriptions  Pending Prescriptions Disp Refills  . amLODipine (NORVASC) 5 MG tablet [Pharmacy Med Name: amLODIPine Besylate 5 MG Oral Tablet] 30 tablet 0    Sig: TAKE 1 TABLET BY MOUTH  DAILY     Cardiovascular:  Calcium Channel Blockers Failed - 10/04/2020  9:32 PM      Failed - Last BP in normal range    BP Readings from Last 1 Encounters:  03/10/20 (!) 144/68         Failed - Valid encounter within last 6 months    Recent Outpatient Visits          6 months ago Annual physical exam   [facility] Birdie Sons, MD   1 year ago Annual physical exam   [facility] Birdie Sons, MD   2 years ago Annual physical exam   [facility] Birdie Sons, MD   3 years ago Essential hypertension   [facility] Birdie Sons, MD   3 years ago Essential hypertension   [facility] Birdie Sons, MD

## 2020-10-20 ENCOUNTER — Other Ambulatory Visit: Payer: Self-pay | Admitting: Family Medicine

## 2020-10-20 NOTE — Telephone Encounter (Signed)
Requested medications are due for refill today yes  Requested medications are on the active medication list yes  Last refill 11/13/2019  Last visit 03/2020 (did not address this med/dx)  Future visit scheduled no  Notes to clinic Not Delegated

## 2020-11-07 ENCOUNTER — Encounter: Payer: Self-pay | Admitting: Family Medicine

## 2020-11-07 DIAGNOSIS — J301 Allergic rhinitis due to pollen: Secondary | ICD-10-CM

## 2020-11-07 DIAGNOSIS — I10 Essential (primary) hypertension: Secondary | ICD-10-CM

## 2020-11-07 NOTE — Telephone Encounter (Signed)
Please schedule patient for follow up office visit this month. Will send 90 day refills so long as she schedules office visit.

## 2020-11-08 ENCOUNTER — Telehealth: Payer: Self-pay | Admitting: Family Medicine

## 2020-11-08 MED ORDER — AMLODIPINE BESYLATE 5 MG PO TABS: 1.0000 | ORAL_TABLET | Freq: Every day | ORAL | 1 refills | Status: DC

## 2020-11-08 MED ORDER — FLUTICASONE PROPIONATE 50 MCG/ACT NA SUSP
NASAL | 3 refills | Status: DC
Start: 2020-11-08 — End: 2022-02-14

## 2020-11-08 MED ORDER — FLUTICASONE PROPIONATE 50 MCG/ACT NA SUSP: NASAL | 3 refills | Status: DC

## 2020-11-08 MED ORDER — AMLODIPINE BESYLATE 5 MG PO TABS
1.0000 | ORAL_TABLET | Freq: Every day | ORAL | 1 refills | Status: DC
Start: 2020-11-08 — End: 2020-11-08

## 2020-11-08 NOTE — Telephone Encounter (Signed)
I called pharmacy and cancelled prescriptions.

## 2020-11-08 NOTE — Telephone Encounter (Signed)
Can you please call the [facility] in [place] and cancel the amlodipine and fluticasone prescriptions that were sent there. Patient wanted them sent to her mail order pharmacy.

## 2020-12-08 ENCOUNTER — Telehealth: Payer: Self-pay | Admitting: Family Medicine

## 2020-12-08 NOTE — Telephone Encounter (Signed)
Patient advised. She states that she should be due for a CPE in September and she prefers to come in at that time. I scheduled her CPE appointment for 03/21/2021 at 10am.

## 2020-12-08 NOTE — Telephone Encounter (Signed)
Please remind patient that she is overdue for follow up visit and needs to be seen before I can refill any of her prescriptions. My schedule is booked until August, so she needs go ahead and make an appointment now.

## 2021-03-21 ENCOUNTER — Encounter: Payer: Self-pay | Admitting: Family Medicine

## 2021-03-21 ENCOUNTER — Other Ambulatory Visit: Payer: Self-pay

## 2021-03-21 ENCOUNTER — Ambulatory Visit (INDEPENDENT_AMBULATORY_CARE_PROVIDER_SITE_OTHER): Payer: Medicare Other | Admitting: Family Medicine

## 2021-03-21 VITALS — BP 144/80 | HR 74 | Ht 64.0 in | Wt 139.0 lb

## 2021-03-21 DIAGNOSIS — E2839 Other primary ovarian failure: Secondary | ICD-10-CM | POA: Diagnosis not present

## 2021-03-21 DIAGNOSIS — G4700 Insomnia, unspecified: Secondary | ICD-10-CM

## 2021-03-21 DIAGNOSIS — M5030 Other cervical disc degeneration, unspecified cervical region: Secondary | ICD-10-CM | POA: Diagnosis not present

## 2021-03-21 DIAGNOSIS — Z0000 Encounter for general adult medical examination without abnormal findings: Secondary | ICD-10-CM | POA: Diagnosis not present

## 2021-03-21 DIAGNOSIS — D84821 Immunodeficiency due to drugs: Secondary | ICD-10-CM

## 2021-03-21 DIAGNOSIS — Z79899 Other long term (current) drug therapy: Secondary | ICD-10-CM

## 2021-03-21 DIAGNOSIS — L4050 Arthropathic psoriasis, unspecified: Secondary | ICD-10-CM

## 2021-03-21 MED ORDER — ZOLPIDEM TARTRATE 10 MG PO TABS: ORAL_TABLET | ORAL | 1 refills | Status: DC

## 2021-03-21 NOTE — Progress Notes (Signed)
Complete Physical Exam      Patient: Meredith Russell, Female    DOB: April 18, 1951, 70 y.o.   MRN: [id number] Visit Date: 03/21/2021  Today's Provider: Lelon Huh, MD   Chief Complaint  Patient presents with   Medicare Wellness   Insomnia   Hypertension   Anxiety   Subjective    Meredith Russell is a 70 y.o. female who presents today for her complete physical examination  She reports consuming a general diet.  Exercises some.  She generally feels well. She reports sleeping fairly well on Ambien. She does not have additional problems to discuss today.   HPI Follow up for insomnia:  The patient was last seen for this 1  year  ago. Changes made at last visit include refilling Ambien.  She reports excellent compliance with treatment. She feels that condition is Unchanged. She is not having side effects.   -----------------------------------------------------------------------------------------   Hypertension, follow-up  BP Readings from Last 3 Encounters:  03/21/21 (!) 144/80  03/10/20 (!) 144/68  10/22/19 120/70   Wt Readings from Last 3 Encounters:  03/21/21 139 lb (63 kg)  03/10/20 123 lb (55.8 kg)  10/22/19 131 lb (59.4 kg)     She was last seen for hypertension 1  year  ago.  BP at that visit was 144/68. Management since that visit includes continue same medication.  She reports excellent compliance with treatment. She is not having side effects.  She is following a Regular diet. She is exercising. She does not smoke.  Use of agents associated with hypertension: NSAIDS.   Outside blood pressures are 120-130's/70's. Symptoms: No chest pain No chest pressure  No palpitations No syncope  No dyspnea No orthopnea  No paroxysmal nocturnal dyspnea No lower extremity edema   Pertinent labs: Lab Results  Component Value Date   CHOL 205 (H) 03/09/2019   HDL 80 03/09/2019   LDLCALC 107 (H) 03/09/2019   TRIG 101 03/09/2019   CHOLHDL 2.6 03/09/2019   Lab  Results  Component Value Date   NA 138 01/28/2020   K 3.9 01/28/2020   CREATININE 0.8 01/28/2020   GFRNONAA 71 01/28/2020   GFRAA 71 03/09/2019   GLUCOSE 95 03/09/2019     The 10-year ASCVD risk score (Arnett DK, et al., 2019) is: 16.6%   ---------------------------------------------------------------------------------------------------     Medications: Outpatient Medications Prior to Visit  Medication Sig   amLODipine (NORVASC) 5 MG tablet Take 1 tablet (5 mg total) by mouth daily.   Biotin 10 MG CAPS Take by mouth daily.   budesonide (ENTOCORT EC) 3 MG 24 hr capsule Take 9 mg by mouth every morning.   butalbital-acetaminophen-caffeine (FIORICET) 50-325-40 MG tablet TAKE 1-2 TABLET BY MOUTH EVERY 6 (SIX) HOURS AS NEEDED FOR HEADACHE.   calcipotriene (DOVONOX) 0.005 % cream Apply 1 application topically 3 (three) times daily as needed (psoriasis).    Cholecalciferol (VITAMIN D3) 1000 units CAPS Take 1,000 Units by mouth daily.    diclofenac (VOLTAREN) 50 MG EC tablet Take 50 mg by mouth daily.    diclofenac sodium (VOLTAREN) 1 % GEL Apply 2 g topically daily as needed (pain).    etanercept (ENBREL SURECLICK) 50 MG/ML injection INJECT '50MG'$  SUBCUTANEOUSLY ONCE WEEKLY  AS DIRECTED.   fexofenadine (ALLEGRA) 180 MG tablet Take 1 tablet (180 mg total) by mouth daily.   fluticasone (FLONASE) 50 MCG/ACT nasal spray USE 2 SPRAYS INTO BOTH  NOSTRILS DAILY AS NEEDED  FOR ALLERGIES   halobetasol (ULTRAVATE)  0.05 % ointment Apply 1 application topically daily as needed (psoriasis).    leflunomide (ARAVA) 20 MG tablet Take 20 mg by mouth daily.    oxyCODONE-acetaminophen (PERCOCET) 7.5-325 MG per tablet Take 1 tablet by mouth every 6 (six) hours as needed for severe pain.    pramoxine-hydrocortisone (ANALPRAM-HC) 1-1 % rectal cream Place 1 application rectally 2 (two) times daily. (Patient taking differently: Place 1 application rectally 2 (two) times daily as needed for hemorrhoids.)    promethazine (PHENERGAN) 25 MG tablet Take 1 tablet (25 mg total) by mouth every 8 (eight) hours as needed for nausea or vomiting.   tiZANidine (ZANAFLEX) 2 MG tablet Take 2 mg by mouth 3 (three) times daily.   triamcinolone cream (KENALOG) 0.5 % Apply 1 application topically 2 (two) times daily. As needed   vitamin B-12 (CYANOCOBALAMIN) 1000 MCG tablet Take 1,000 mcg by mouth daily.    vitamin C (ASCORBIC ACID) 500 MG tablet Take 500 mg by mouth daily.    zolpidem (AMBIEN) 10 MG tablet TAKE 1 TABLET BY MOUTH EVERYDAY AT BEDTIME   ALPRAZolam (XANAX) 0.5 MG tablet TAKE 1 TABLET BY MOUTH EVERY 4 HOURS AS NEEDED   aspirin 81 MG tablet Take 81 mg by mouth daily.  (Patient not taking: Reported on 03/10/2020)   No facility-administered medications prior to visit.    Allergies  Allergen Reactions   Adalimumab Itching and Rash    Psoriasis   Morphine Other (See Comments)    Tachycardia   Morphine And Related Other (See Comments)    Tachycardia   Pregabalin Other (See Comments)    Increased BP    Savella  [Milnacipran] Rash   Sulfa Antibiotics Nausea And Vomiting    Patient Care Team: Birdie Sons, MD as PCP - General (Family Medicine) Emmaline Kluver., MD (Rheumatology) Alfonso Patten, MD as Consulting Physician (Dermatology) Sharlet Salina, MD as Referring Physician (Physical Medicine and Rehabilitation) Efrain Sella, MD as Consulting Physician (Gastroenterology) Eustace Moore, MD as Consulting Physician (Neurosurgery)  Review of Systems  Constitutional:  Positive for appetite change. Negative for activity change, chills, diaphoresis, fatigue, fever and unexpected weight change.  HENT:  Positive for sinus pressure and tinnitus. Negative for congestion, dental problem, drooling, ear discharge, ear pain, facial swelling, hearing loss, mouth sores, nosebleeds, postnasal drip, rhinorrhea, sinus pain, sneezing, sore throat, trouble swallowing and voice change.   Eyes:   Positive for photophobia. Negative for pain, discharge, redness, itching and visual disturbance.  Respiratory: Negative.    Cardiovascular: Negative.   Gastrointestinal:  Positive for abdominal pain. Negative for abdominal distention, anal bleeding, blood in stool, constipation, diarrhea, nausea, rectal pain and vomiting.  Endocrine: Positive for cold intolerance. Negative for heat intolerance, polydipsia, polyphagia and polyuria.  Genitourinary: Negative.   Musculoskeletal:  Positive for arthralgias, back pain, joint swelling, neck pain and neck stiffness. Negative for gait problem and myalgias.  Skin: Negative.   Allergic/Immunologic: Negative.   Neurological:  Positive for dizziness and headaches. Negative for tremors, seizures, syncope, facial asymmetry, speech difficulty, weakness, light-headedness and numbness.  Hematological: Negative.   Psychiatric/Behavioral:  Positive for sleep disturbance. Negative for agitation, behavioral problems, confusion, decreased concentration, dysphoric mood, hallucinations, self-injury and suicidal ideas. The patient is not nervous/anxious and is not hyperactive.        Objective    Vitals: BP (!) 144/80 (BP Location: Right Arm, Patient Position: Sitting, Cuff Size: Normal)   Pulse 74   Ht '5\' 4"'$  (1.626 m)  Wt 139 lb (63 kg)   SpO2 99%   BMI 23.86 kg/m    Physical Exam  General Appearance:    Well developed, well nourished female. Alert, cooperative, in no acute distress, appears stated age   Head:    Normocephalic, without obvious abnormality, atraumatic  Eyes:    PERRL, conjunctiva/corneas clear, EOM's intact, fundi    benign, both eyes  Ears:    Normal TM's and external ear canals, both ears  Neck:   Supple, symmetrical, trachea midline, no adenopathy;    thyroid:  no enlargement/tenderness/nodules; no carotid   bruit or JVD  Back:     Symmetric, no curvature, ROM normal, no CVA tenderness  Lungs:     Clear to auscultation bilaterally,  respirations unlabored   Heart:    Normal heart rate. Normal rhythm. No murmurs, rubs, or gallops.    Breast Exam:    deferred  Abdomen:     Soft, non-tender, bowel sounds active all four quadrants,    no masses, no organomegaly  Pelvic:    deferred  Extremities:   All extremities are intact. No cyanosis or edema  Pulses:   2+ and symmetric all extremities  Skin:   Skin color, texture, turgor normal, no rashes or lesions      Assessment & Plan      1. Annual physical exam   2. Estrogen deficiency She would like to have her BMD at the same time as her mammogram in December. Will place order in November to schedule  8. Immunocompromised state due to drug therapy [facility]) Recently started back on methotrexate per rheumatology, Dr. Jefm Bryant. UTD on pneumonia vaccines. She states she had Shingrix earlier this year at her pharmacy. Recommended flu vaccine which she declined.   4. Insomnia, unspecified type refill zolpidem (AMBIEN) 10 MG tablet; TAKE 1 TABLET BY MOUTH EVERYDAY AT BEDTIME  Dispense: 90 tablet; Refill: 1   5. DDD (degenerative disc disease), cervical Stable on current pain medications.   6. Psoriatic arthritis ([place]) Continue routine follow up rheumatology.       The entirety of the information documented in the History of Present Illness, Review of Systems and Physical Exam were personally obtained by me. Portions of this information were initially documented by the CMA and reviewed by me for thoroughness and accuracy.     Lelon Huh, MD  [facility] [phone] (phone) [phone] (fax)  [place]

## 2021-03-21 NOTE — Patient Instructions (Signed)
Please review the attached list of medications and notify my office if there are any errors.   We'll send an order for your Bone Density test in November, so you can schedule it the same day as your mammogram in December.

## 2021-03-21 NOTE — Progress Notes (Signed)
Annual Wellness Visit     Patient: Meredith Russell, Female    DOB: 1951-04-11, 70 y.o.   MRN: [id number] Visit Date: 03/21/2021  Today's Provider: Lelon Huh, MD    Subjective    Meredith Russell is a 70 y.o. female who presents today for her Annual Wellness Visit.   Medications: Outpatient Medications Prior to Visit  Medication Sig   amLODipine (NORVASC) 5 MG tablet Take 1 tablet (5 mg total) by mouth daily.   Biotin 10 MG CAPS Take by mouth daily.   budesonide (ENTOCORT EC) 3 MG 24 hr capsule Take 9 mg by mouth every morning.   butalbital-acetaminophen-caffeine (FIORICET) 50-325-40 MG tablet TAKE 1-2 TABLET BY MOUTH EVERY 6 (SIX) HOURS AS NEEDED FOR HEADACHE.   calcipotriene (DOVONOX) 0.005 % cream Apply 1 application topically 3 (three) times daily as needed (psoriasis).    Cholecalciferol (VITAMIN D3) 1000 units CAPS Take 1,000 Units by mouth daily.    diclofenac (VOLTAREN) 50 MG EC tablet Take 50 mg by mouth daily.    diclofenac sodium (VOLTAREN) 1 % GEL Apply 2 g topically daily as needed (pain).    etanercept (ENBREL SURECLICK) 50 MG/ML injection INJECT '50MG'$  SUBCUTANEOUSLY ONCE WEEKLY  AS DIRECTED.   fexofenadine (ALLEGRA) 180 MG tablet Take 1 tablet (180 mg total) by mouth daily.   fluticasone (FLONASE) 50 MCG/ACT nasal spray USE 2 SPRAYS INTO BOTH  NOSTRILS DAILY AS NEEDED  FOR ALLERGIES   halobetasol (ULTRAVATE) 0.05 % ointment Apply 1 application topically daily as needed (psoriasis).    leflunomide (ARAVA) 20 MG tablet Take 20 mg by mouth daily.    oxyCODONE-acetaminophen (PERCOCET) 7.5-325 MG per tablet Take 1 tablet by mouth every 6 (six) hours as needed for severe pain.    pramoxine-hydrocortisone (ANALPRAM-HC) 1-1 % rectal cream Place 1 application rectally 2 (two) times daily. (Patient taking differently: Place 1 application rectally 2 (two) times daily as needed for hemorrhoids.)   promethazine (PHENERGAN) 25 MG tablet Take 1 tablet (25 mg total) by mouth  every 8 (eight) hours as needed for nausea or vomiting.   tiZANidine (ZANAFLEX) 2 MG tablet Take 2 mg by mouth 3 (three) times daily.   triamcinolone cream (KENALOG) 0.5 % Apply 1 application topically 2 (two) times daily. As needed   vitamin B-12 (CYANOCOBALAMIN) 1000 MCG tablet Take 1,000 mcg by mouth daily.    vitamin C (ASCORBIC ACID) 500 MG tablet Take 500 mg by mouth daily.    [DISCONTINUED] zolpidem (AMBIEN) 10 MG tablet TAKE 1 TABLET BY MOUTH EVERYDAY AT BEDTIME   ALPRAZolam (XANAX) 0.5 MG tablet TAKE 1 TABLET BY MOUTH EVERY 4 HOURS AS NEEDED   aspirin 81 MG tablet Take 81 mg by mouth daily.  (Patient not taking: Reported on 03/10/2020)   No facility-administered medications prior to visit.    Allergies  Allergen Reactions   Adalimumab Itching and Rash    Psoriasis   Morphine Other (See Comments)    Tachycardia   Morphine And Related Other (See Comments)    Tachycardia   Pregabalin Other (See Comments)    Increased BP    Savella  [Milnacipran] Rash   Sulfa Antibiotics Nausea And Vomiting    Patient Care Team: Birdie Sons, MD as PCP - General (Family Medicine) Emmaline Kluver., MD (Rheumatology) Alfonso Patten, MD as Consulting Physician (Dermatology) Sharlet Salina, MD as Referring Physician (Physical Medicine and Rehabilitation) Efrain Sella, MD as Consulting Physician (Gastroenterology) Eustace Moore, MD  as Consulting Physician (Neurosurgery)    Objective     Most recent functional status assessment: In your present state of health, do you have any difficulty performing the following activities: 03/21/2021  Hearing? N  Vision? N  Difficulty concentrating or making decisions? N  Walking or climbing stairs? N  Dressing or bathing? N  Doing errands, shopping? N  Some recent data might be hidden   Most recent fall risk assessment: Fall Risk  03/21/2021  Falls in the past year? 0  Number falls in past yr: 0  Injury with Fall? 0  Risk for fall  due to : No Fall Risks  Follow up Falls evaluation completed    Most recent depression screenings: PHQ 2/9 Scores 03/21/2021 03/10/2020  PHQ - 2 Score 0 0  PHQ- 9 Score 5 4   Most recent cognitive screening: 6CIT Screen 03/21/2021  What Year? 0 points  What month? 0 points  What time? 0 points  Count back from 20 0 points  Months in reverse 0 points  Repeat phrase 2 points  Total Score 2   Most recent Audit-C alcohol use screening Alcohol Use Disorder Test (AUDIT) 03/21/2021  1. How often do you have a drink containing alcohol? 0  2. How many drinks containing alcohol do you have on a typical day when you are drinking? 0  3. How often do you have six or more drinks on one occasion? 0  AUDIT-C Score 0  Alcohol Brief Interventions/Follow-up -   A score of 3 or more in women, and 4 or more in men indicates increased risk for alcohol abuse, EXCEPT if all of the points are from question 1   No results found for any visits on 03/21/21.  Assessment & Plan     Annual wellness visit done today including the all of the following: Reviewed patient's Family Medical History Reviewed and updated list of patient's medical providers Assessment of cognitive impairment was done Assessed patient's functional ability Established a written schedule for health screening [place] Completed and Reviewed  Exercise Activities and Dietary recommendations  Goals      Increase water intake     Recommend increasing water intake to 6-8 glasses a day.         Immunization History  Administered Date(s) Administered   Fluad Quad(high Dose 65+) 03/09/2019   Influenza, High Dose Seasonal PF 02/26/2017, 05/11/2018   PPD Test 09/11/2015   Pneumococcal Conjugate-13 01/23/2015   Pneumococcal Polysaccharide-23 02/22/2016   Td 02/26/2017   Tdap 10/07/2006   Zoster, Live 07/06/2012    Health Maintenance  Topic Date Due   COVID-19 Vaccine (1) Never done   Zoster Vaccines-  Shingrix (1 of 2) Never done   DEXA SCAN  04/23/2020   INFLUENZA VACCINE  10/05/2021 (Originally 02/05/2021)   COLONOSCOPY (Pts 45-6yr Insurance coverage will need to be confirmed)  03/05/2022   MAMMOGRAM  06/14/2022   TETANUS/TDAP  02/27/2027   Hepatitis C Screening  Completed   PNA vac Low Risk Adult  Completed   HPV VACCINES  Aged Out     Discussed health benefits of physical activity, and encouraged her to engage in regular exercise appropriate for her age and condition.        The entirety of the information documented in the History of Present Illness, Review of Systems and Physical Exam were personally obtained by me. Portions of this information were initially documented by the CMA and reviewed by me for thoroughness and  accuracy.     Lelon Huh, MD  [facility] [phone] (phone) [phone] (fax)  [place]

## 2021-04-19 ENCOUNTER — Other Ambulatory Visit: Payer: Self-pay | Admitting: Family Medicine

## 2021-04-19 DIAGNOSIS — I10 Essential (primary) hypertension: Secondary | ICD-10-CM

## 2021-04-28 ENCOUNTER — Other Ambulatory Visit: Payer: Self-pay | Admitting: Family Medicine

## 2021-05-10 ENCOUNTER — Other Ambulatory Visit: Payer: Self-pay | Admitting: Family Medicine

## 2021-05-10 DIAGNOSIS — E2839 Other primary ovarian failure: Secondary | ICD-10-CM

## 2021-05-18 ENCOUNTER — Other Ambulatory Visit: Payer: Self-pay | Admitting: Family Medicine

## 2021-05-18 DIAGNOSIS — Z1231 Encounter for screening mammogram for malignant neoplasm of breast: Secondary | ICD-10-CM

## 2021-06-13 ENCOUNTER — Other Ambulatory Visit: Payer: Medicare Other

## 2021-06-18 ENCOUNTER — Other Ambulatory Visit: Payer: Self-pay

## 2021-06-18 ENCOUNTER — Ambulatory Visit
Admission: RE | Admit: 2021-06-18 | Discharge: 2021-06-18 | Disposition: A | Discharge status: Home / Self Care | Payer: Medicare Other | Source: Ambulatory Visit | Attending: Family Medicine | Admitting: Family Medicine

## 2021-06-18 DIAGNOSIS — E2839 Other primary ovarian failure: Secondary | ICD-10-CM | POA: Diagnosis present

## 2021-06-18 DIAGNOSIS — Z1231 Encounter for screening mammogram for malignant neoplasm of breast: Secondary | ICD-10-CM | POA: Insufficient documentation

## 2021-06-21 ENCOUNTER — Ambulatory Visit (INDEPENDENT_AMBULATORY_CARE_PROVIDER_SITE_OTHER): Payer: Medicare Other | Admitting: Dermatology

## 2021-06-21 ENCOUNTER — Other Ambulatory Visit: Payer: Self-pay

## 2021-06-21 ENCOUNTER — Encounter: Payer: Self-pay | Admitting: Dermatology

## 2021-06-21 DIAGNOSIS — L578 Other skin changes due to chronic exposure to nonionizing radiation: Secondary | ICD-10-CM

## 2021-06-21 DIAGNOSIS — L720 Epidermal cyst: Secondary | ICD-10-CM | POA: Diagnosis not present

## 2021-06-21 DIAGNOSIS — L814 Other melanin hyperpigmentation: Secondary | ICD-10-CM

## 2021-06-21 DIAGNOSIS — L570 Actinic keratosis: Secondary | ICD-10-CM

## 2021-06-21 DIAGNOSIS — D1801 Hemangioma of skin and subcutaneous tissue: Secondary | ICD-10-CM

## 2021-06-21 DIAGNOSIS — Z872 Personal history of diseases of the skin and subcutaneous tissue: Secondary | ICD-10-CM

## 2021-06-21 DIAGNOSIS — D229 Melanocytic nevi, unspecified: Secondary | ICD-10-CM

## 2021-06-21 DIAGNOSIS — L821 Other seborrheic keratosis: Secondary | ICD-10-CM

## 2021-06-21 DIAGNOSIS — Z1283 Encounter for screening for malignant neoplasm of skin: Secondary | ICD-10-CM | POA: Diagnosis not present

## 2021-06-21 NOTE — Patient Instructions (Addendum)
Cryotherapy Aftercare  Wash gently with soap and water everyday.   Apply Vaseline and Band-Aid daily until healed.   Prior to procedure, discussed risks of blister formation, small wound, skin dyspigmentation, or rare scar following cryotherapy. Recommend Vaseline ointment to treated areas while healing.  Melanoma ABCDEs  Melanoma is the most dangerous type of skin cancer, and is the leading cause of death from skin disease.  You are more likely to develop melanoma if you: Have light-colored skin, light-colored eyes, or red or blond hair Spend a lot of time in the sun Tan regularly, either outdoors or in a tanning bed Have had blistering sunburns, especially during childhood Have a close family member who has had a melanoma Have atypical moles or large birthmarks  Early detection of melanoma is key since treatment is typically straightforward and cure rates are extremely high if we catch it early.   The first sign of melanoma is often a change in a mole or a new dark spot.  The ABCDE system is a way of remembering the signs of melanoma.  A for asymmetry:  The two halves do not match. B for border:  The edges of the growth are irregular. C for color:  A mixture of colors are present instead of an even brown color. D for diameter:  Melanomas are usually (but not always) greater than 6mm - the size of a pencil eraser. E for evolution:  The spot keeps changing in size, shape, and color.  Please check your skin once per month between visits. You can use a small mirror in front and a large mirror behind you to keep an eye on the back side or your body.   If you see any new or changing lesions before your next follow-up, please call to schedule a visit.  Please continue daily skin protection including broad spectrum sunscreen SPF 30+ to sun-exposed areas, reapplying every 2 hours as needed when you're outdoors.    If You Need Anything After Your Visit  If you have any questions or  concerns for your doctor, please call our main line at [phone] and press option 4 to reach your doctor's medical assistant. If no one answers, please leave a voicemail as directed and we will return your call as soon as possible. Messages left after 4 pm will be answered the following business day.   You may also send us a message via MyChart. We typically respond to MyChart messages within 1-2 business days.  For prescription refills, please ask your pharmacy to contact our office. Our fax number is [phone].  If you have an urgent issue when the clinic is closed that cannot wait until the next business day, you can page your doctor at the number below.    Please note that while we do our best to be available for urgent issues outside of office hours, we are not available 24/7.   If you have an urgent issue and are unable to reach us, you may choose to seek medical care at your doctor's office, retail clinic, urgent care center, or emergency room.  If you have a medical emergency, please immediately call 911 or go to the emergency department.  Pager Numbers  - Dr. Kowalski: [phone]  - Dr. Moye: [phone]  - Dr. Stewart: [phone]  In the event of inclement weather, please call our main line at [phone] for an update on the status of any delays or closures.  Dermatology Medication Tips: Please keep the boxes that   topical medications come in in order to help keep track of the instructions about where and how to use these. Pharmacies typically print the medication instructions only on the boxes and not directly on the medication tubes.   If your medication is too expensive, please contact our office at [phone] option 4 or send us a message through MyChart.   We are unable to tell what your co-pay for medications will be in advance as this is different depending on your insurance coverage. However, we may be able to find a substitute medication at lower cost or  fill out paperwork to get insurance to cover a needed medication.   If a prior authorization is required to get your medication covered by your insurance company, please allow us 1-2 business days to complete this process.  Drug prices often vary depending on where the prescription is filled and some pharmacies may offer cheaper prices.  The website www.goodrx.com contains coupons for medications through different pharmacies. The prices here do not account for what the cost may be with help from insurance (it may be cheaper with your insurance), but the website can give you the price if you did not use any insurance.  - You can print the associated coupon and take it with your prescription to the pharmacy.  - You may also stop by our office during regular business hours and pick up a GoodRx coupon card.  - If you need your prescription sent electronically to a different pharmacy, notify our office through [facility] MyChart or by phone at [phone] option 4.     Si Usted Necesita Algo Despus de Su Visita  Tambin puede enviarnos un mensaje a travs de MyChart. Por lo general respondemos a los mensajes de MyChart en el transcurso de 1 a 2 das hbiles.  Para renovar recetas, por favor pida a su farmacia que se ponga en contacto con nuestra oficina. Nuestro nmero de fax es el [phone].  Si tiene un asunto urgente cuando la clnica est cerrada y que no puede esperar hasta el siguiente da hbil, puede llamar/localizar a su doctor(a) al nmero que aparece a continuacin.   Por favor, tenga en cuenta que aunque hacemos todo lo posible para estar disponibles para asuntos urgentes fuera del horario de oficina, no estamos disponibles las 24 horas del da, los 7 das de la semana.   Si tiene un problema urgente y no puede comunicarse con nosotros, puede optar por buscar atencin mdica  en el consultorio de su doctor(a), en una clnica privada, en un centro de atencin urgente o en una sala  de emergencias.  Si tiene una emergencia mdica, por favor llame inmediatamente al 911 o vaya a la sala de emergencias.  Nmeros de bper  - Dr. Kowalski: [phone]  - Dra. Moye: [phone]  - Dra. Stewart: [phone]  En caso de inclemencias del tiempo, por favor llame a nuestra lnea principal al [phone] para una actualizacin sobre el estado de cualquier retraso o cierre.  Consejos para la medicacin en dermatologa: Por favor, guarde las cajas en las que vienen los medicamentos de uso tpico para ayudarle a seguir las instrucciones sobre dnde y cmo usarlos. Las farmacias generalmente imprimen las instrucciones del medicamento slo en las cajas y no directamente en los tubos del medicamento.   Si su medicamento es muy caro, por favor, pngase en contacto con nuestra oficina llamando al [phone] y presione la opcin 4 o envenos un mensaje a travs de MyChart.   No   podemos decirle cul ser su copago por los medicamentos por adelantado ya que esto es diferente dependiendo de la cobertura de su seguro. Sin embargo, es posible que podamos encontrar un medicamento sustituto a menor costo o llenar un formulario para que el seguro cubra el medicamento que se considera necesario.   Si se requiere una autorizacin previa para que su compaa de seguros cubra su medicamento, por favor permtanos de 1 a 2 das hbiles para completar este proceso.  Los precios de los medicamentos varan con frecuencia dependiendo del lugar de dnde se surte la receta y alguna farmacias pueden ofrecer precios ms baratos.  El sitio web www.goodrx.com tiene cupones para medicamentos de diferentes farmacias. Los precios aqu no tienen en cuenta lo que podra costar con la ayuda del seguro (puede ser ms barato con su seguro), pero el sitio web puede darle el precio si no utiliz ningn seguro.  - Puede imprimir el cupn correspondiente y llevarlo con su receta a la farmacia.  - Tambin puede pasar  por nuestra oficina durante el horario de atencin regular y recoger una tarjeta de cupones de GoodRx.  - Si necesita que su receta se enve electrnicamente a una farmacia diferente, informe a nuestra oficina a travs de MyChart de [facility] o por telfono llamando al [phone] y presione la opcin 4.  

## 2021-06-21 NOTE — Progress Notes (Signed)
Follow-Up Visit   Subjective  Meredith Russell is a 70 y.o. female who presents for the following: FBSE (Patient here for full body skin exam and skin cancer screening. Patient with hx of AK's. She has noticed a spot at her upper lip that comes and goes, present for < 1 year. She also has some lumps behind ears she would like checked. ).  Patient accompanied by husband.   The following portions of the chart were reviewed this encounter and updated as appropriate:   Tobacco   Allergies   Meds   Problems   Med Hx   Surg Hx   Fam Hx       Review of Systems:  No other skin or systemic complaints except as noted in HPI or Assessment and Plan.  Objective  Well appearing patient in no apparent distress; mood and affect are within normal limits.  A full examination was performed including scalp, head, eyes, ears, nose, lips, neck, chest, axillae, abdomen, back, buttocks, bilateral upper extremities, bilateral lower extremities, hands, feet, fingers, toes, fingernails, and toenails. All findings within normal limits unless otherwise noted below.  left upper cutaneous lip x 2, right helix x 1, right forehead x 1 (4) Erythematous thin papules/macules with gritty scale.   Bilateral Postauricular Subcutaneous nodule.    Assessment & Plan  AK (actinic keratosis) (4) left upper cutaneous lip x 2, right helix x 1, right forehead x 1  Prior to procedure, discussed risks of blister formation, small wound, skin dyspigmentation, or rare scar following cryotherapy. Recommend Vaseline ointment to treated areas while healing.  Actinic keratoses are precancerous spots that appear secondary to cumulative UV radiation exposure/sun exposure over time. They are chronic with expected duration over 1 year. A portion of actinic keratoses will progress to squamous cell carcinoma of the skin. It is not possible to reliably predict which spots will progress to skin cancer and so treatment is recommended to prevent  development of skin cancer.  Recommend daily broad spectrum sunscreen SPF 30+ to sun-exposed areas, reapply every 2 hours as needed.  Recommend staying in the shade or wearing long sleeves, sun glasses (UVA+UVB protection) and wide brim hats (4-inch brim around the entire circumference of the hat). Call for new or changing lesions.  Patient prefers to call if these do not clear after treatment rather than schedule an AK follow-up at this time   Destruction of lesion - left upper cutaneous lip x 2, right helix x 1, right forehead x 1  Destruction method: cryotherapy   Informed consent: discussed and consent obtained   Lesion destroyed using liquid nitrogen: Yes   Cryotherapy cycles:  2 Outcome: patient tolerated procedure well with no complications   Post-procedure details: wound care instructions given    Epidermal inclusion cyst Bilateral Postauricular  Benign-appearing. Exam most consistent with an epidermal inclusion cyst. Discussed that a cyst is a benign growth that can grow over time and sometimes get irritated or inflamed. Recommend observation if it is not bothersome. Discussed option of surgical excision to remove it if it is growing, symptomatic, or other changes noted. Please call for new or changing lesions so they can be evaluated.    Lentigines - Scattered tan macules - Due to sun exposure - Benign-appearing, observe - Recommend daily broad spectrum sunscreen SPF 30+ to sun-exposed areas, reapply every 2 hours as needed. - Call for any changes  Seborrheic Keratoses - Stuck-on, waxy, tan-brown papules and/or plaques  - Benign-appearing - Discussed benign  etiology and prognosis. - Observe - Call for any changes  Melanocytic Nevi - Tan-brown and/or pink-flesh-colored symmetric macules and papules - Benign appearing on exam today - Observation - Call clinic for new or changing moles - Recommend daily use of broad spectrum spf 30+ sunscreen to sun-exposed areas.    Hemangiomas - Red papules - Discussed benign nature - Observe - Call for any changes  Actinic Damage - Chronic condition, secondary to cumulative UV/sun exposure - diffuse scaly erythematous macules with underlying dyspigmentation - Recommend daily broad spectrum sunscreen SPF 30+ to sun-exposed areas, reapply every 2 hours as needed.  - Staying in the shade or wearing long sleeves, sun glasses (UVA+UVB protection) and wide brim hats (4-inch brim around the entire circumference of the hat) are also recommended for sun protection.  - Call for new or changing lesions.  Skin cancer screening performed today.  History of PreCancerous Actinic Keratosis  - site(s) of PreCancerous Actinic Keratosis clear today. - these may recur and new lesions may form requiring treatment to prevent transformation into skin cancer - observe for new or changing spots and contact [facility] for appointment if occur - photoprotection with sun protective clothing; sunglasses and broad spectrum sunscreen with SPF of at least 30 + and frequent self skin exams recommended - yearly exams by a dermatologist recommended for persons with history of PreCancerous Actinic Keratoses  Return in about 1 year (around 06/21/2022) for TBSE.  Graciella Belton, RMA, am acting as scribe for Forest Gleason, MD .  Documentation: I have reviewed the above documentation for accuracy and completeness, and I agree with the above.  Forest Gleason, MD

## 2021-12-05 ENCOUNTER — Ambulatory Visit: Payer: Self-pay | Admitting: *Deleted

## 2021-12-05 NOTE — Telephone Encounter (Signed)
Summary: Swelling in ankle + emotional trauma   Ankle swelling, blister on foot that is not healing properly. Patient just lost her daughter 2 months ago, says she is not emotionally well.   Best contact: [phone]        Chief Complaint: ankle swelling, blister not healing on left heel, anxiety  Symptoms: left heel blister draining yesterday not today. Bilateral ankles swelling , chest pain upper sternum at times. No difficulty breathing . Difficulty walking due to pain left heel.emotional issues due to death of daughter, anxiety crying all of the time. "Do not want to do anything". Frequency: 1 week  Pertinent Negatives: Patient denies difficulty breathing no sweating. No dizziness, no fever Disposition: '[]'$ ED /'[]'$ Urgent Care (no appt availability in office) / '[x]'$ Appointment(In office/virtual)/ '[]'$  [facility]/ '[]'$ Home Care/ '[]'$ Refused Recommended Disposition /'[]'$ [facility]/ '[]'$  Follow-up with PCP Additional Notes:   Appt scheduled for 12/06/21. Recommended to go to ED if symptoms worsen     Reason for Disposition  [1] MODERATE pain (e.g., interferes with normal activities, limping) AND [2] present > 3 days  Answer Assessment - Initial Assessment Questions 1. LOCATION: "Which ankle is swollen?" "Where is the swelling?"    Bilateral ankle swelling, left heel blister not healing  2. ONSET: "When did the swelling start?"     1 week ago  3. SIZE: "How large is the swelling?"     na 4. PAIN: "Is there any pain?" If Yes, ask: "How bad is it?" (Scale 1-10; or mild, moderate, severe)   - NONE (0): no pain.   - MILD (1-3): doesn't interfere with normal activities.    - MODERATE (4-7): interferes with normal activities (e.g., work or school) or awakens from sleep, limping.    - SEVERE (8-10): excruciating pain, unable to do any normal activities, unable to walk.      Yes pain  5. CAUSE: "What do you think caused the ankle swelling?"     Not sure  6. OTHER SYMPTOMS: "Do  you have any other symptoms?" (e.g., fever, chest pain, difficulty breathing, calf pain)     Chest pain top of sternum.  7. PREGNANCY: "Is there any chance you are pregnant?" "When was your last menstrual period?"     na  Protocols used: Ankle Swelling-A-AH

## 2021-12-05 NOTE — Progress Notes (Unsigned)
Established patient visit   Patient: Meredith Russell   DOB: 19-Dec-1950   71 y.o. Female  MRN: [id number] Visit Date: 12/06/2021  Today's healthcare provider: Gwyneth Sprout, FNP  Patient presents for new patient visit to establish care.  Introduced to Designer, jewellery role and practice setting.  All questions answered.  Discussed provider/patient relationship and expectations.   I,Aison Malveaux J Carron Mcmurry,acting as a scribe for Gwyneth Sprout, FNP.,have documented all relevant documentation on the behalf of Gwyneth Sprout, FNP,as directed by  Gwyneth Sprout, FNP while in the presence of Gwyneth Sprout, FNP.   Chief Complaint  Patient presents with   Anxiety   Foot Swelling   Subjective    HPI HPI   Patient states she has a blister on her L heel that is sore, swollen and open starting 1 1/2 weeks ago.  Last edited by Smitty Knudsen, CMA on 12/06/2021 10:35 AM.      Anxiety, Follow-up  She was last seen for anxiety 2 years ago. Changes made at last visit include continue medication as needed.   She reports good compliance with treatment. She reports good tolerance of treatment. She is having side effects. fatigue  She feels her anxiety is moderate and Unchanged since last visit.  Symptoms: Yes chest pain Yes difficulty concentrating  No dizziness Yes fatigue  No feelings of losing control Yes insomnia  Yes irritable No palpitations  Yes panic attacks Yes racing thoughts  No shortness of breath No sweating  No tremors/shakes    GAD-7 Results     View : No data to display.          PHQ-9 Scores    12/06/2021   11:04 AM 03/21/2021    9:36 AM 03/10/2020    9:58 AM  PHQ9 SCORE ONLY  PHQ-9 Total Score '5 5 4    '$ ---------------------------------------------------------------------------------------------------   Medications: Outpatient Medications Prior to Visit  Medication Sig   amLODipine (NORVASC) 5 MG tablet TAKE 1 TABLET BY MOUTH  DAILY   Biotin 10 MG CAPS Take  by mouth daily.   budesonide (ENTOCORT EC) 3 MG 24 hr capsule Take 9 mg by mouth every morning.   butalbital-acetaminophen-caffeine (FIORICET) 50-325-40 MG tablet TAKE 1-2 TABLET BY MOUTH EVERY 6 (SIX) HOURS AS NEEDED FOR HEADACHE.   calcipotriene (DOVONOX) 0.005 % cream Apply 1 application topically 3 (three) times daily as needed (psoriasis).    Cholecalciferol (VITAMIN D3) 1000 units CAPS Take 1,000 Units by mouth daily.    diclofenac (VOLTAREN) 50 MG EC tablet Take 50 mg by mouth daily.    diclofenac sodium (VOLTAREN) 1 % GEL Apply 2 g topically daily as needed (pain).    etanercept (ENBREL SURECLICK) 50 MG/ML injection INJECT '50MG'$  SUBCUTANEOUSLY ONCE WEEKLY  AS DIRECTED.   fexofenadine (ALLEGRA) 180 MG tablet TAKE 1 TABLET BY MOUTH EVERY DAY   fluticasone (FLONASE) 50 MCG/ACT nasal spray USE 2 SPRAYS INTO BOTH  NOSTRILS DAILY AS NEEDED  FOR ALLERGIES   halobetasol (ULTRAVATE) 0.05 % ointment Apply 1 application topically daily as needed (psoriasis).    leflunomide (ARAVA) 20 MG tablet Take 20 mg by mouth daily.    oxyCODONE-acetaminophen (PERCOCET) 7.5-325 MG per tablet Take 1 tablet by mouth every 6 (six) hours as needed for severe pain.    pramoxine-hydrocortisone (ANALPRAM-HC) 1-1 % rectal cream Place 1 application rectally 2 (two) times daily. (Patient taking differently: Place 1 application. rectally 2 (two) times daily as needed for  hemorrhoids.)   promethazine (PHENERGAN) 25 MG tablet Take 1 tablet (25 mg total) by mouth every 8 (eight) hours as needed for nausea or vomiting.   tiZANidine (ZANAFLEX) 2 MG tablet Take 2 mg by mouth 3 (three) times daily.   triamcinolone cream (KENALOG) 0.5 % Apply 1 application topically 2 (two) times daily. As needed   vitamin B-12 (CYANOCOBALAMIN) 1000 MCG tablet Take 1,000 mcg by mouth daily.    vitamin C (ASCORBIC ACID) 500 MG tablet Take 500 mg by mouth daily.    zolpidem (AMBIEN) 10 MG tablet TAKE 1 TABLET BY MOUTH EVERYDAY AT BEDTIME   aspirin 81  MG tablet Take 81 mg by mouth daily.  (Patient not taking: Reported on 03/10/2020)   [DISCONTINUED] ALPRAZolam (XANAX) 0.5 MG tablet TAKE 1 TABLET BY MOUTH EVERY 4 HOURS AS NEEDED   No facility-administered medications prior to visit.    Review of Systems     Objective    BP 124/85 (BP Location: Right Arm, Patient Position: Sitting, Cuff Size: Normal)   Pulse 86   Temp 98.3 F (36.8 C) (Oral)   Resp 16   Ht '5\' 3"'$  (1.6 m)   Wt 131 lb 4.8 oz (59.6 kg)   SpO2 99%   BMI 23.26 kg/m    Physical Exam Vitals and nursing note reviewed.  Constitutional:      General: She is not in acute distress.    Appearance: Normal appearance. She is normal weight. She is not ill-appearing, toxic-appearing or diaphoretic.  HENT:     Head: Normocephalic and atraumatic.  Cardiovascular:     Rate and Rhythm: Normal rate and regular rhythm.     Pulses: Normal pulses.     Heart sounds: Normal heart sounds. No murmur heard.   No friction rub. No gallop.  Pulmonary:     Effort: Pulmonary effort is normal. No respiratory distress.     Breath sounds: Normal breath sounds. No stridor. No wheezing, rhonchi or rales.  Chest:     Chest wall: No tenderness.  Abdominal:     General: Bowel sounds are normal.     Palpations: Abdomen is soft.  Musculoskeletal:        General: No swelling, tenderness, deformity or signs of injury. Normal range of motion.     Right lower leg: No edema.     Left lower leg: No edema.  Skin:    General: Skin is warm and dry.     Capillary Refill: Capillary refill takes less than 2 seconds.     Coloration: Skin is not jaundiced or pale.     Findings: No bruising, erythema, lesion or rash.       Neurological:     General: No focal deficit present.     Mental Status: She is alert and oriented to person, place, and time. Mental status is at baseline.     Cranial Nerves: No cranial nerve deficit.     Sensory: No sensory deficit.     Motor: No weakness.     Coordination:  Coordination normal.  Psychiatric:        Mood and Affect: Mood normal. Affect is tearful.        Behavior: Behavior normal.        Thought Content: Thought content normal.        Judgment: Judgment normal.      No results found for any visits on 12/06/21.  Assessment & Plan     Problem List Items Addressed This Visit  Musculoskeletal and Integument   Abrasion of skin of left foot - Primary    Blister has opened, site is clean, skin is closed. No signs of infection.  Encouraged patient to use non drying, non abrasive cleaners. Stop using ETOH and H2O2 on site. Encouraged to keep clean with traditional soap and water. Use of topical bandage is appropriate during waking hours. Recommend open to air overnight. Reassurance provided. Seek additional care if site deteriorates.         Other   Adaptation reaction    Daughter, passed away last month, was on hospice, had dementia Acute concern Has tried Xanax in the last year to assist with ongoing heath decline of her daughter- did not do well with medication due to side effects Is on chronic narcotics due to chronic back pain Does continue to drink alcohol Depression screening reviewed Recommend use of low dose SSRI to assist Recommend use of additional benzo; pt has tried Valium previously for MRI Will allow for small quantity Encouraged to use GoodRx to try non habit forming medication, Hydroxyzine, as alternative. Encouraged to dispose of Xanax at pharmacy when picking up Valium. Additional precautions provided on Rx.  Pt is aware of risks of opioid medication use to include increased sedation, respiratory suppression, falls, dependence and cardiovascular events.  Pt would like to continue treatment as benefit determined to outweigh risk.   RTC in 2 months for follow up of depression.         Relevant Medications   sertraline (ZOLOFT) 25 MG tablet   hydrOXYzine (ATARAX) 25 MG tablet   diazepam (VALIUM) 2 MG tablet      Return in about 2 months (around 02/05/2022) for anxiety and depression.      Vonna Kotyk, FNP, have reviewed all documentation for this visit. The documentation on 12/06/21 for the exam, diagnosis, procedures, and orders are all accurate and complete.    Gwyneth Sprout, [place] [phone] (phone) [phone] (fax)  [place]

## 2021-12-06 ENCOUNTER — Encounter: Payer: Self-pay | Admitting: Family Medicine

## 2021-12-06 ENCOUNTER — Ambulatory Visit (INDEPENDENT_AMBULATORY_CARE_PROVIDER_SITE_OTHER): Payer: Medicare Other | Admitting: Family Medicine

## 2021-12-06 VITALS — BP 124/85 | HR 86 | Temp 98.3°F | Resp 16 | Ht 63.0 in | Wt 131.3 lb

## 2021-12-06 DIAGNOSIS — S90812A Abrasion, left foot, initial encounter: Secondary | ICD-10-CM | POA: Insufficient documentation

## 2021-12-06 DIAGNOSIS — F4321 Adjustment disorder with depressed mood: Secondary | ICD-10-CM

## 2021-12-06 MED ORDER — HYDROXYZINE HCL 25 MG PO TABS: 25.0000 mg | ORAL_TABLET | Freq: Three times a day (TID) | ORAL | 0 refills | Status: DC | PRN

## 2021-12-06 MED ORDER — SERTRALINE HCL 25 MG PO TABS: 25.0000 mg | ORAL_TABLET | Freq: Every day | ORAL | 3 refills | Status: DC

## 2021-12-06 MED ORDER — DIAZEPAM 2 MG PO TABS
2.0000 mg | ORAL_TABLET | Freq: Every day | ORAL | 2 refills | Status: DC | PRN
Start: 2021-12-06 — End: 2022-02-20

## 2021-12-06 NOTE — Assessment & Plan Note (Signed)
Daughter, passed away last month, was on hospice, had dementia Acute concern Has tried Xanax in the last year to assist with ongoing heath decline of her daughter- did not do well with medication due to side effects Is on chronic narcotics due to chronic back pain Does continue to drink alcohol Depression screening reviewed Recommend use of low dose SSRI to assist Recommend use of additional benzo; pt has tried Valium previously for MRI Will allow for small quantity Encouraged to use GoodRx to try non habit forming medication, Hydroxyzine, as alternative. Encouraged to dispose of Xanax at pharmacy when picking up Valium. Additional precautions provided on Rx.  Pt is aware of risks of opioid medication use to include increased sedation, respiratory suppression, falls, dependence and cardiovascular events.  Pt would like to continue treatment as benefit determined to outweigh risk.   RTC in 2 months for follow up of depression.

## 2021-12-06 NOTE — Assessment & Plan Note (Signed)
Blister has opened, site is clean, skin is closed. No signs of infection.  Encouraged patient to use non drying, non abrasive cleaners. Stop using ETOH and H2O2 on site. Encouraged to keep clean with traditional soap and water. Use of topical bandage is appropriate during waking hours. Recommend open to air overnight. Reassurance provided. Seek additional care if site deteriorates.

## 2021-12-11 ENCOUNTER — Encounter: Payer: Self-pay | Admitting: Family Medicine

## 2021-12-11 ENCOUNTER — Telehealth: Payer: Self-pay | Admitting: Family Medicine

## 2021-12-11 ENCOUNTER — Ambulatory Visit (INDEPENDENT_AMBULATORY_CARE_PROVIDER_SITE_OTHER): Payer: Medicare Other | Admitting: Family Medicine

## 2021-12-11 VITALS — BP 143/82 | HR 77 | Temp 98.0°F | Resp 16 | Wt 134.0 lb

## 2021-12-11 DIAGNOSIS — M069 Rheumatoid arthritis, unspecified: Secondary | ICD-10-CM

## 2021-12-11 DIAGNOSIS — G43009 Migraine without aura, not intractable, without status migrainosus: Secondary | ICD-10-CM

## 2021-12-11 DIAGNOSIS — R110 Nausea: Secondary | ICD-10-CM | POA: Diagnosis not present

## 2021-12-11 DIAGNOSIS — G4700 Insomnia, unspecified: Secondary | ICD-10-CM

## 2021-12-11 MED ORDER — PROMETHAZINE HCL 25 MG PO TABS: 25.0000 mg | ORAL_TABLET | Freq: Three times a day (TID) | ORAL | 5 refills | Status: DC | PRN

## 2021-12-11 MED ORDER — PREDNISONE 10 MG PO TABS: ORAL_TABLET | ORAL | 0 refills | Status: AC

## 2021-12-11 MED ORDER — ZOLPIDEM TARTRATE 10 MG PO TABS: ORAL_TABLET | ORAL | 1 refills | Status: DC

## 2021-12-11 MED ORDER — BUTALBITAL-APAP-CAFFEINE 50-325-40 MG PO TABS: ORAL_TABLET | ORAL | 5 refills | Status: DC

## 2021-12-11 NOTE — Telephone Encounter (Signed)
Patient is at [facility] now w/ husband.    Patient states the Hydroxyzine is making her very nauseous every time she takes it.   Is this normal?  Also she said you 2 talked about giving her promethazine and Butalbital/acet/caffeine for her migraines that Dr. Caryn Section use to give.  Her migraines have come back dealing with the stress of losing her daughter.   If you could send your CMA over to talk to her about these, it would be great.

## 2021-12-11 NOTE — Progress Notes (Unsigned)
I,Meredith Russell,acting as a scribe for Meredith Huh, MD.,have documented all relevant documentation on the behalf of Meredith Huh, MD,as directed by  Meredith Huh, MD while in the presence of Meredith Huh, MD.  {Template Exceptions:1}  Established patient visit   Patient: Meredith Russell   DOB: Aug 13, 1950   71 y.o. Female  MRN: [id number] Visit Date: 12/11/2021  Today's healthcare provider: Lelon Huh, MD   Chief Complaint  Patient presents with   Nausea   Subjective    Patient reports nausea and more migraines than normal.  States the Hydroxyzine she started June 1st is causing the nausea but helping with sadness over loss of daughter.  Requesting refill for Diazepam to has on hand.    Medications: Outpatient Medications Prior to Visit  Medication Sig   amLODipine (NORVASC) 5 MG tablet TAKE 1 TABLET BY MOUTH  DAILY   aspirin 81 MG tablet Take 81 mg by mouth daily.   budesonide (ENTOCORT EC) 3 MG 24 hr capsule Take 9 mg by mouth every morning.   Cholecalciferol (VITAMIN D3) 1000 units CAPS Take 1,000 Units by mouth daily.    diazepam (VALIUM) 2 MG tablet Take 1 tablet (2 mg total) by mouth daily as needed for anxiety. Use after use of atarax. Avoid combined use with narcotics or alcohol. Can be habit forming. Do not drive machinery.   diclofenac (VOLTAREN) 50 MG EC tablet Take 50 mg by mouth daily.    diclofenac sodium (VOLTAREN) 1 % GEL Apply 2 g topically daily as needed (pain).    etanercept (ENBREL SURECLICK) 50 MG/ML injection INJECT '50MG'$  SUBCUTANEOUSLY ONCE WEEKLY  AS DIRECTED.   fexofenadine (ALLEGRA) 180 MG tablet TAKE 1 TABLET BY MOUTH EVERY DAY   fluticasone (FLONASE) 50 MCG/ACT nasal spray USE 2 SPRAYS INTO BOTH  NOSTRILS DAILY AS NEEDED  FOR ALLERGIES   halobetasol (ULTRAVATE) 0.05 % ointment Apply 1 application topically daily as needed (psoriasis).    hydrOXYzine (ATARAX) 25 MG tablet Take 1 tablet (25 mg total) by mouth 3 (three) times daily as needed for  anxiety.   leflunomide (ARAVA) 20 MG tablet Take 20 mg by mouth daily.    oxyCODONE-acetaminophen (PERCOCET) 7.5-325 MG per tablet Take 1 tablet by mouth every 6 (six) hours as needed for severe pain.    pramoxine-hydrocortisone (ANALPRAM-HC) 1-1 % rectal cream Place 1 application rectally 2 (two) times daily. (Patient taking differently: Place 1 application. rectally 2 (two) times daily as needed for hemorrhoids.)   promethazine (PHENERGAN) 25 MG tablet Take 1 tablet (25 mg total) by mouth every 8 (eight) hours as needed for nausea or vomiting.   sertraline (ZOLOFT) 25 MG tablet Take 1 tablet (25 mg total) by mouth daily.   tiZANidine (ZANAFLEX) 2 MG tablet Take 2 mg by mouth 3 (three) times daily.   triamcinolone cream (KENALOG) 0.5 % Apply 1 application topically 2 (two) times daily. As needed   vitamin B-12 (CYANOCOBALAMIN) 1000 MCG tablet Take 1,000 mcg by mouth daily.    vitamin C (ASCORBIC ACID) 500 MG tablet Take 500 mg by mouth daily.    zolpidem (AMBIEN) 10 MG tablet TAKE 1 TABLET BY MOUTH EVERYDAY AT BEDTIME   Biotin 10 MG CAPS Take by mouth daily. (Patient not taking: Reported on 12/11/2021)   butalbital-acetaminophen-caffeine (FIORICET) 50-325-40 MG tablet TAKE 1-2 TABLET BY MOUTH EVERY 6 (SIX) HOURS AS NEEDED FOR HEADACHE.   calcipotriene (DOVONOX) 0.005 % cream Apply 1 application topically 3 (three) times daily as needed (  psoriasis).  (Patient not taking: Reported on 12/11/2021)   No facility-administered medications prior to visit.    Review of Systems  {Labs  Heme  Chem  Endocrine  Serology  Results Review (optional):23779}   Objective    BP (!) 143/82 (BP Location: Right Arm, Patient Position: Sitting, Cuff Size: Normal)   Pulse 77   Temp 98 F (36.7 C) (Oral)   Resp 16   Wt 134 lb (60.8 kg)   SpO2 100%   BMI 23.74 kg/m  {Show previous vital signs (optional):23777}  Physical Exam  ***  No results found for any visits on 12/11/21.  Assessment & Plan      ***  No follow-ups on file.      {provider attestation***:1}   Meredith Huh, MD  [facility] [phone] (phone) [phone] (fax)  [place]

## 2021-12-18 ENCOUNTER — Other Ambulatory Visit: Payer: Self-pay | Admitting: Family Medicine

## 2021-12-18 DIAGNOSIS — F4321 Adjustment disorder with depressed mood: Secondary | ICD-10-CM

## 2021-12-28 ENCOUNTER — Other Ambulatory Visit: Payer: Self-pay | Admitting: Family Medicine

## 2021-12-28 DIAGNOSIS — F4321 Adjustment disorder with depressed mood: Secondary | ICD-10-CM

## 2022-01-21 ENCOUNTER — Ambulatory Visit: Payer: Medicare Other | Admitting: Family Medicine

## 2022-02-13 ENCOUNTER — Other Ambulatory Visit: Payer: Self-pay | Admitting: Family Medicine

## 2022-02-13 DIAGNOSIS — J301 Allergic rhinitis due to pollen: Secondary | ICD-10-CM

## 2022-02-20 ENCOUNTER — Encounter: Payer: Self-pay | Admitting: Family Medicine

## 2022-02-20 ENCOUNTER — Ambulatory Visit (INDEPENDENT_AMBULATORY_CARE_PROVIDER_SITE_OTHER): Payer: Medicare Other | Admitting: Family Medicine

## 2022-02-20 VITALS — BP 144/74 | HR 66 | Temp 97.8°F | Resp 16 | Wt 128.0 lb

## 2022-02-20 DIAGNOSIS — F4321 Adjustment disorder with depressed mood: Secondary | ICD-10-CM | POA: Diagnosis not present

## 2022-02-20 DIAGNOSIS — L4050 Arthropathic psoriasis, unspecified: Secondary | ICD-10-CM | POA: Diagnosis not present

## 2022-02-20 MED ORDER — DIAZEPAM 2 MG PO TABS: 2.0000 mg | ORAL_TABLET | Freq: Every day | ORAL | 3 refills | Status: DC | PRN

## 2022-02-20 MED ORDER — DULOXETINE HCL 20 MG PO CPEP: 20.0000 mg | ORAL_CAPSULE | Freq: Every evening | ORAL | 1 refills | Status: DC

## 2022-02-20 NOTE — Progress Notes (Signed)
I,Roshena L Chambers,acting as a scribe for Lelon Huh, MD.,have documented all relevant documentation on the behalf of Lelon Huh, MD,as directed by  Lelon Huh, MD while in the presence of Lelon Huh, MD.   Established patient visit   Patient: Meredith Russell   DOB: Feb 22, 1951   71 y.o. Female  MRN: [id number] Visit Date: 02/20/2022  Today's healthcare provider: Lelon Huh, MD   Chief Complaint  Patient presents with   Depression   Anxiety   Subjective    HPI  Anxiety and depression, Follow-up  She  was last seen for this 2 months ago (seen by Tally Joe, FNP). Changes made at last visit include starting sertraline, hydroxyzine and diazepam.   She reports poor compliance with treatment. Patient states she stopped all medications 1 week ago due to starting prednisone for arthritis treatment. She is having side effects. Hydroxyzine caused her to have heart palpitations. She states that she did feel like the sertraline helped a lot with crying spells that started after her daughter passed away. She isn't having crying spells, but still feels depressed  She reports poor tolerance of treatment. Current symptoms include: depressed mood and anxiety. She states she has taken diazepam since 5 tablets were prescribed back in June and that it was helpful. She would like something to take for anxiety prn, but not the hydroxyzine due to side effects. She is also having a lot of pain related to psoriatic arthritis and is in midst of medication changes per her rheumatologist.       02/20/2022    8:33 AM 12/06/2021   11:04 AM 03/21/2021    9:36 AM  Depression screen PHQ 2/9  Decreased Interest 2 0 0  Down, Depressed, Hopeless 2 0 0  PHQ - 2 Score 4 0 0  Altered sleeping 3 0 3  Tired, decreased energy '1 3 2  '$ Change in appetite 1 2 0  Feeling bad or failure about yourself  0 0 0  Trouble concentrating 1 0 0  Moving slowly or fidgety/restless 0 0 0  Suicidal thoughts 0 0  0  PHQ-9 Score '10 5 5  '$ Difficult doing work/chores Somewhat difficult Somewhat difficult Not difficult at all    -----------------------------------------------------------------------------------------   Medications: Outpatient Medications Prior to Visit  Medication Sig   amLODipine (NORVASC) 5 MG tablet TAKE 1 TABLET BY MOUTH  DAILY   aspirin 81 MG tablet Take 81 mg by mouth daily.   Biotin 10 MG CAPS Take by mouth daily.   budesonide (ENTOCORT EC) 3 MG 24 hr capsule Take 9 mg by mouth every morning.   butalbital-acetaminophen-caffeine (FIORICET) 50-325-40 MG tablet TAKE 1-2 TABLET BY MOUTH EVERY 6 (SIX) HOURS AS NEEDED FOR HEADACHE.   calcipotriene (DOVONOX) 0.005 % cream Apply 1 application  topically 3 (three) times daily as needed (psoriasis).   Cholecalciferol (VITAMIN D3) 1000 units CAPS Take 1,000 Units by mouth daily.    diclofenac (VOLTAREN) 50 MG EC tablet Take 50 mg by mouth daily.    diclofenac sodium (VOLTAREN) 1 % GEL Apply 2 g topically daily as needed (pain).    etanercept (ENBREL SURECLICK) 50 MG/ML injection INJECT '50MG'$  SUBCUTANEOUSLY ONCE WEEKLY  AS DIRECTED.   fexofenadine (ALLEGRA) 180 MG tablet TAKE 1 TABLET BY MOUTH EVERY DAY   fluticasone (FLONASE) 50 MCG/ACT nasal spray USE 2 SPRAYS IN BOTH  NOSTRILS DAILY AS NEEDED  FOR ALLERGIES   halobetasol (ULTRAVATE) 0.05 % ointment Apply 1 application topically daily  as needed (psoriasis).    leflunomide (ARAVA) 20 MG tablet Take 20 mg by mouth daily.    methotrexate (RHEUMATREX) 2.5 MG tablet Take 4 tablets by mouth daily.   oxyCODONE-acetaminophen (PERCOCET) 7.5-325 MG per tablet Take 1 tablet by mouth every 6 (six) hours as needed for severe pain.    pramoxine-hydrocortisone (ANALPRAM-HC) 1-1 % rectal cream Place 1 application rectally 2 (two) times daily. (Patient taking differently: Place 1 application  rectally 2 (two) times daily as needed for hemorrhoids.)   predniSONE (DELTASONE) 10 MG tablet Take 1 tablet by mouth  daily.   promethazine (PHENERGAN) 25 MG tablet Take 1 tablet (25 mg total) by mouth every 8 (eight) hours as needed for nausea or vomiting.   tiZANidine (ZANAFLEX) 2 MG tablet Take 2 mg by mouth 3 (three) times daily.   triamcinolone cream (KENALOG) 0.5 % Apply 1 application topically 2 (two) times daily. As needed   vitamin B-12 (CYANOCOBALAMIN) 1000 MCG tablet Take 1,000 mcg by mouth daily.    vitamin C (ASCORBIC ACID) 500 MG tablet Take 500 mg by mouth daily.    zolpidem (AMBIEN) 10 MG tablet TAKE 1 TABLET BY MOUTH EVERYDAY AT BEDTIME   diazepam (VALIUM) 2 MG tablet Take 1 tablet (2 mg total) by mouth daily as needed for anxiety. Use after use of atarax. Avoid combined use with narcotics or alcohol. Can be habit forming. Do not drive machinery. (Patient not taking: Reported on 02/20/2022)   hydrOXYzine (ATARAX) 25 MG tablet TAKE 1 TABLET BY MOUTH 3 TIMES DAILY AS NEEDED FOR ANXIETY. (Patient not taking: Reported on 02/20/2022)   sertraline (ZOLOFT) 25 MG tablet Take 1 tablet (25 mg total) by mouth daily. (Patient not taking: Reported on 02/20/2022)   No facility-administered medications prior to visit.    Review of Systems  Constitutional:  Negative for appetite change, chills, fatigue and fever.  Respiratory:  Negative for chest tightness and shortness of breath.   Cardiovascular:  Negative for chest pain and palpitations.  Gastrointestinal:  Negative for abdominal pain, nausea and vomiting.  Neurological:  Negative for dizziness and weakness.  Psychiatric/Behavioral:  Positive for dysphoric mood. The patient is nervous/anxious.        Objective    BP (!) 144/74 (BP Location: Left Arm, Patient Position: Sitting, Cuff Size: Normal)   Pulse 66   Temp 97.8 F (36.6 C) (Oral)   Resp 16   Wt 128 lb (58.1 kg)   SpO2 100% Comment: room air  BMI 22.67 kg/m    Physical Exam  General appearance: Well developed, well nourished female, cooperative and in no acute distress Head:  Normocephalic, without obvious abnormality, atraumatic Respiratory: Respirations even and unlabored, normal respiratory rate Extremities: All extremities are intact.  Skin: Skin color, texture, turgor normal. No rashes seen  Psych: Appropriate mood and affect. Neurologic: Mental status: Alert, oriented to person, place, and time, thought content appropriate.   Assessment & Plan     1. Adjustment disorder with depressed mood Crying spells have improved, but still feeling depressed and anxious. States she didn't like the way she felt with sertraline, although it did help a bit with depression. Did not tolerate hydroxyzine due to palpations. She would like something different to take just occasionally, and has found that the low dose of diazepam has been helpful.   refill diazepam (VALIUM) 2 MG tablet; Take 1 tablet (2 mg total) by mouth daily as needed for anxiety.  Dispense: 20 tablet; Refill: 3  Anxiety is partially  being fueled by pain related to psoriatic arthritis. Will try  DULoxetine (CYMBALTA) 20 MG capsule; Take 1 capsule (20 mg total) by mouth every evening. Increase to 2 capsules daily after 2 weeks  Dispense: 30 capsule; Refill: 1  2. Psoriatic arthritis ([place]) Follow up rheumatology as scheduled.   Follow up here about a month      The entirety of the information documented in the History of Present Illness, Review of Systems and Physical Exam were personally obtained by me. Portions of this information were initially documented by the CMA and reviewed by me for thoroughness and accuracy.     Lelon Huh, MD  [facility] [phone] (phone) [phone] (fax)  [place]

## 2022-02-20 NOTE — Patient Instructions (Signed)
.   Please review the attached list of medications and notify my office if there are any errors.   . Please bring all of your medications to every appointment so we can make sure that our medication list is the same as yours.   

## 2022-02-27 ENCOUNTER — Other Ambulatory Visit: Payer: Self-pay | Admitting: Family Medicine

## 2022-02-27 DIAGNOSIS — F4321 Adjustment disorder with depressed mood: Secondary | ICD-10-CM

## 2022-03-20 ENCOUNTER — Ambulatory Visit: Payer: Medicare Other | Admitting: Family Medicine

## 2022-03-22 ENCOUNTER — Ambulatory Visit (INDEPENDENT_AMBULATORY_CARE_PROVIDER_SITE_OTHER): Payer: Medicare Other | Admitting: Family Medicine

## 2022-03-22 ENCOUNTER — Encounter: Payer: Self-pay | Admitting: Family Medicine

## 2022-03-22 ENCOUNTER — Other Ambulatory Visit: Payer: Self-pay | Admitting: Family Medicine

## 2022-03-22 VITALS — BP 156/87 | HR 67 | Resp 16 | Ht 64.0 in | Wt 128.0 lb

## 2022-03-22 DIAGNOSIS — F439 Reaction to severe stress, unspecified: Secondary | ICD-10-CM | POA: Diagnosis not present

## 2022-03-22 DIAGNOSIS — M069 Rheumatoid arthritis, unspecified: Secondary | ICD-10-CM | POA: Diagnosis not present

## 2022-03-22 DIAGNOSIS — D84821 Immunodeficiency due to drugs: Secondary | ICD-10-CM | POA: Diagnosis not present

## 2022-03-22 DIAGNOSIS — G4700 Insomnia, unspecified: Secondary | ICD-10-CM | POA: Diagnosis not present

## 2022-03-22 DIAGNOSIS — Z79899 Other long term (current) drug therapy: Secondary | ICD-10-CM

## 2022-03-22 DIAGNOSIS — I10 Essential (primary) hypertension: Secondary | ICD-10-CM

## 2022-03-22 MED ORDER — ZOLPIDEM TARTRATE 10 MG PO TABS: ORAL_TABLET | ORAL | 1 refills | Status: DC

## 2022-03-22 MED ORDER — LORAZEPAM 1 MG PO TABS: 0.5000 mg | ORAL_TABLET | Freq: Two times a day (BID) | ORAL | 1 refills | Status: DC | PRN

## 2022-03-22 NOTE — Patient Instructions (Signed)
.   Please review the attached list of medications and notify my office if there are any errors.   . Please bring all of your medications to every appointment so we can make sure that our medication list is the same as yours.   

## 2022-03-22 NOTE — Progress Notes (Unsigned)
I,Tiffany J Bragg,acting as a scribe for Lelon Huh, MD.,have documented all relevant documentation on the behalf of Lelon Huh, MD,as directed by  Lelon Huh, MD while in the presence of Lelon Huh, MD.   Established patient visit   Patient: Meredith Russell   DOB: 05/20/1951   71 y.o. Female  MRN: [id number] Visit Date: 03/22/2022  Today's healthcare provider: Lelon Huh, MD   Chief Complaint  Patient presents with   Depression   Subjective    HPI  Anxiety and Depression, Follow-up  She  was last seen for this 1 months ago. Changes made at last visit include stopping sertraline and starting duloxetine '20mg'$  and titrating up to '40mg'$  after two weeks. However she states she felt worse when she was taking the '20mg'$  tablets so has since stopped taking about 2 weeks ago. She has also switched from [place]rel to [place] recently which seems to be helping with pain.  She reports excellent compliance with treatment. She is not having side effects.   She reports fair tolerance of treatment. She states that diazepam was not helpful, but has taken lorazepam occasionally which  has been helpful       02/20/2022    8:33 AM 12/06/2021   11:04 AM 03/21/2021    9:36 AM  Depression screen PHQ 2/9  Decreased Interest 2 0 0  Down, Depressed, Hopeless 2 0 0  PHQ - 2 Score 4 0 0  Altered sleeping 3 0 3  Tired, decreased energy '1 3 2  '$ Change in appetite 1 2 0  Feeling bad or failure about yourself  0 0 0  Trouble concentrating 1 0 0  Moving slowly or fidgety/restless 0 0 0  Suicidal thoughts 0 0 0  PHQ-9 Score '10 5 5  '$ Difficult doing work/chores Somewhat difficult Somewhat difficult Not difficult at all    -----------------------------------------------------------------------------------------   Medications: Outpatient Medications Prior to Visit  Medication Sig   amLODipine (NORVASC) 5 MG tablet TAKE 1 TABLET BY MOUTH  DAILY   aspirin 81 MG tablet Take 81 mg by mouth daily.    budesonide (ENTOCORT EC) 3 MG 24 hr capsule Take 9 mg by mouth every morning.   butalbital-acetaminophen-caffeine (FIORICET) 50-325-40 MG tablet TAKE 1-2 TABLET BY MOUTH EVERY 6 (SIX) HOURS AS NEEDED FOR HEADACHE.   calcipotriene (DOVONOX) 0.005 % cream Apply 1 application  topically 3 (three) times daily as needed (psoriasis).   Cholecalciferol (VITAMIN D3) 1000 units CAPS Take 1,000 Units by mouth daily.    diazepam (VALIUM) 2 MG tablet Take 1 tablet (2 mg total) by mouth daily as needed for anxiety. (PATIENT NOT TAKING)   diclofenac (VOLTAREN) 50 MG EC tablet Take 50 mg by mouth daily.    diclofenac Sodium (VOLTAREN) 1 % GEL Apply topically.   DULoxetine (CYMBALTA) 20 MG capsule Take 1 capsule (20 mg total) by mouth every evening. Increase to 2 capsules daily after 2 weeks (PATIENT NOT TAKING)   etanercept (ENBREL SURECLICK) 50 MG/ML injection INJECT '50MG'$  SUBCUTANEOUSLY ONCE WEEKLY  AS DIRECTED.   fexofenadine (ALLEGRA) 180 MG tablet TAKE 1 TABLET BY MOUTH EVERY DAY   fluticasone (FLONASE) 50 MCG/ACT nasal spray USE 2 SPRAYS IN BOTH  NOSTRILS DAILY AS NEEDED  FOR ALLERGIES   folic acid (FOLVITE) 1 MG tablet Take 1 mg by mouth daily.   halobetasol (ULTRAVATE) 0.05 % ointment Apply 1 application topically daily as needed (psoriasis).    leflunomide (ARAVA) 20 MG tablet Take 20 mg by  mouth daily.    methotrexate (RHEUMATREX) 2.5 MG tablet Take 4 tablets by mouth daily.   oxyCODONE-acetaminophen (PERCOCET) 7.5-325 MG per tablet Take 1 tablet by mouth every 6 (six) hours as needed for severe pain.    pramoxine-hydrocortisone (ANALPRAM-HC) 1-1 % rectal cream Place 1 application rectally 2 (two) times daily. (Patient taking differently: Place 1 application  rectally 2 (two) times daily as needed for hemorrhoids.)   predniSONE (DELTASONE) 10 MG tablet Take 1 tablet by mouth daily.   promethazine (PHENERGAN) 25 MG tablet Take 1 tablet (25 mg total) by mouth every 8 (eight) hours as needed for nausea or  vomiting.   TALTZ 80 MG/ML SOAJ Inject into the skin.   tiZANidine (ZANAFLEX) 2 MG tablet Take 2 mg by mouth 3 (three) times daily.   tiZANidine (ZANAFLEX) 4 MG tablet Take 4 mg by mouth 3 (three) times daily.   triamcinolone cream (KENALOG) 0.5 % Apply 1 application topically 2 (two) times daily. As needed   vitamin B-12 (CYANOCOBALAMIN) 1000 MCG tablet Take 1,000 mcg by mouth daily.    vitamin C (ASCORBIC ACID) 500 MG tablet Take 500 mg by mouth daily.    zolpidem (AMBIEN) 10 MG tablet TAKE 1 TABLET BY MOUTH EVERYDAY AT BEDTIME   Biotin 10 MG CAPS Take by mouth daily.   diclofenac sodium (VOLTAREN) 1 % GEL Apply 2 g topically daily as needed (pain).    No facility-administered medications prior to visit.    Review of Systems  {Labs  Heme  Chem  Endocrine  Serology  Results Review (optional):23779}   Objective    BP (!) 156/87 (BP Location: Right Arm, Patient Position: Sitting, Cuff Size: Normal)   Pulse 67   Resp 16   Ht '5\' 4"'$  (1.626 m)   Wt 128 lb (58.1 kg)   BMI 21.97 kg/m  {Show previous vital signs (optional):23777}  Physical Exam  ***  No results found for any visits on 03/22/22.  Assessment & Plan     ***  No follow-ups on file.      {provider attestation***:1}   Lelon Huh, MD  [facility] [phone] (phone) [phone] (fax)  [place]

## 2022-04-01 ENCOUNTER — Telehealth: Payer: Self-pay | Admitting: Family Medicine

## 2022-04-01 NOTE — Telephone Encounter (Signed)
Copied from [place] [id number]. Topic: Medicare AWV >> Apr 01, 2022 11:12 AM Jae Dire wrote: Reason for CRM:  Left message for patient to call back and schedule Medicare Annual Wellness Visit (AWV) in office.   If unable to come into the office for AWV,  please offer to do virtually or by telephone.  Last AWV: 03/21/2021  Please schedule at anytime with [facility] Advisor.  30 minute appointment for Virtual or phone 45 minute appointment for in office or Initial virtual/phone  Any questions, please contact me at [phone]

## 2022-04-01 NOTE — Telephone Encounter (Signed)
Patient declined doing AWVS this year  - doesn't feel she needs to complete

## 2022-05-20 ENCOUNTER — Other Ambulatory Visit: Payer: Self-pay | Admitting: Family Medicine

## 2022-05-20 DIAGNOSIS — Z1231 Encounter for screening mammogram for malignant neoplasm of breast: Secondary | ICD-10-CM

## 2022-06-04 ENCOUNTER — Other Ambulatory Visit: Payer: Self-pay | Admitting: Family Medicine

## 2022-06-11 ENCOUNTER — Encounter: Payer: Self-pay | Admitting: Dermatology

## 2022-06-11 ENCOUNTER — Ambulatory Visit (INDEPENDENT_AMBULATORY_CARE_PROVIDER_SITE_OTHER): Payer: Medicare Other | Admitting: Dermatology

## 2022-06-11 VITALS — BP 132/76 | HR 74

## 2022-06-11 DIAGNOSIS — L570 Actinic keratosis: Secondary | ICD-10-CM

## 2022-06-11 DIAGNOSIS — D492 Neoplasm of unspecified behavior of bone, soft tissue, and skin: Secondary | ICD-10-CM

## 2022-06-11 DIAGNOSIS — Z1283 Encounter for screening for malignant neoplasm of skin: Secondary | ICD-10-CM | POA: Diagnosis not present

## 2022-06-11 DIAGNOSIS — D239 Other benign neoplasm of skin, unspecified: Secondary | ICD-10-CM

## 2022-06-11 DIAGNOSIS — D2272 Melanocytic nevi of left lower limb, including hip: Secondary | ICD-10-CM

## 2022-06-11 DIAGNOSIS — L578 Other skin changes due to chronic exposure to nonionizing radiation: Secondary | ICD-10-CM

## 2022-06-11 DIAGNOSIS — D229 Melanocytic nevi, unspecified: Secondary | ICD-10-CM

## 2022-06-11 DIAGNOSIS — L821 Other seborrheic keratosis: Secondary | ICD-10-CM | POA: Diagnosis not present

## 2022-06-11 DIAGNOSIS — L814 Other melanin hyperpigmentation: Secondary | ICD-10-CM

## 2022-06-11 DIAGNOSIS — L409 Psoriasis, unspecified: Secondary | ICD-10-CM | POA: Diagnosis not present

## 2022-06-11 HISTORY — DX: Other benign neoplasm of skin, unspecified: D23.9

## 2022-06-11 NOTE — Patient Instructions (Addendum)
Cryotherapy Aftercare  Wash gently with soap and water everyday.   Apply Vaseline daily until healed.    Wynzora cream apply once daily to affected areas of psoriasis in scalp. Call if would like prescription of similar solution sent in.   Wound Care Instructions  Cleanse wound gently with soap and water once a day then pat dry with clean gauze. Apply a thin coat of Petrolatum (petroleum jelly, "Vaseline") over the wound (unless you have an allergy to this). We recommend that you use a new, sterile tube of Vaseline. Do not pick or remove scabs. Do not remove the yellow or white "healing tissue" from the base of the wound.  Cover the wound with fresh, clean, nonstick gauze and secure with paper tape. You may use Band-Aids in place of gauze and tape if the wound is small enough, but would recommend trimming much of the tape off as there is often too much. Sometimes Band-Aids can irritate the skin.  You should call the office for your biopsy report after 1 week if you have not already been contacted.  If you experience any problems, such as abnormal amounts of bleeding, swelling, significant bruising, significant pain, or evidence of infection, please call the office immediately.  FOR ADULT SURGERY PATIENTS: If you need something for pain relief you may take 1 extra strength Tylenol (acetaminophen) AND 2 Ibuprofen ('200mg'$  each) together every 4 hours as needed for pain. (do not take these if you are allergic to them or if you have a reason you should not take them.) Typically, you may only need pain medication for 1 to 3 days.      Recommend daily broad spectrum sunscreen SPF 30+ to sun-exposed areas, reapply every 2 hours as needed. Call for new or changing lesions.  Staying in the shade or wearing long sleeves, sun glasses (UVA+UVB protection) and wide brim hats (4-inch brim around the entire circumference of the hat) are also recommended for sun protection.   Recommend taking Heliocare sun  protection supplement daily in sunny weather for additional sun protection. For maximum protection on the sunniest days, you can take up to 2 capsules of regular Heliocare OR take 1 capsule of Heliocare Ultra. For prolonged exposure (such as a full day in the sun), you can repeat your dose of the supplement 4 hours after your first dose. Heliocare can be purchased at [employer], at some [facility] or at [email or web address].    Melanoma ABCDEs  Melanoma is the most dangerous type of skin cancer, and is the leading cause of death from skin disease.  You are more likely to develop melanoma if you: Have light-colored skin, light-colored eyes, or red or blond hair Spend a lot of time in the sun Tan regularly, either outdoors or in a tanning bed Have had blistering sunburns, especially during childhood Have a close family member who has had a melanoma Have atypical moles or large birthmarks  Early detection of melanoma is key since treatment is typically straightforward and cure rates are extremely high if we catch it early.   The first sign of melanoma is often a change in a mole or a new dark spot.  The ABCDE system is a way of remembering the signs of melanoma.  A for asymmetry:  The two halves do not match. B for border:  The edges of the growth are irregular. C for color:  A mixture of colors are present instead of an even brown color. D for diameter:  Melanomas  are usually (but not always) greater than 61m - the size of a pencil eraser. E for evolution:  The spot keeps changing in size, shape, and color.  Please check your skin once per month between visits. You can use a small mirror in front and a large mirror behind you to keep an eye on the back side or your body.   If you see any new or changing lesions before your next follow-up, please call to schedule a visit.  Please continue daily skin protection including broad spectrum sunscreen SPF 30+ to sun-exposed areas, reapplying  every 2 hours as needed when you're outdoors.   Staying in the shade or wearing long sleeves, sun glasses (UVA+UVB protection) and wide brim hats (4-inch brim around the entire circumference of the hat) are also recommended for sun protection.    Due to recent changes in healthcare laws, you may see results of your pathology and/or laboratory studies on MyChart before the doctors have had a chance to review them. We understand that in some cases there may be results that are confusing or concerning to you. Please understand that not all results are received at the same time and often the doctors may need to interpret multiple results in order to provide you with the best plan of care or course of treatment. Therefore, we ask that you please give u[place]2 business days to thoroughly review all your results before contacting the office for clarification. Should we see a critical lab result, you will be contacted sooner.   If You Need Anything After Your Visit  If you have any questions or concerns for your doctor, please call our main line at [phone] press option 4 to reach your doctor's medical assistant. If no one answers, please leave a voicemail as directed and we will return your call as soon as possible. Messages left after 4 pm will be answered the following business day.   You may also send [place] message via M[place] We typically respond to MyChart messages within 1-2 business days.  For prescription refills, please ask your pharmacy to contact our office. Our fax number is [phone]  If you have an urgent issue when the clinic is closed that cannot wait until the next business day, you can page your doctor at the number below.    Please note that while we do our best to be available for urgent issues outside of office hours, we are not available 24/7.   If you have an urgent issue and are unable to reach u[place] you may choose to seek medical care at your doctor's office, retail clinic,  urgent care center, or emergency room.  If you have a medical emergency, please immediately call 911 or go to the emergency department.  Pager Numbers  - Dr. KNehemiah Massed [phone] - Dr. MLaurence Ferrari [phone] - Dr. SNicole Kindred [phone] In the event of inclement weather, please call our main line at [phone] an update on the status of any delays or closures.  Dermatology Medication Tips: Please keep the boxes that topical medications come in in order to help keep track of the instructions about where and how to use these. Pharmacies typically print the medication instructions only on the boxes and not directly on the medication tubes.   If your medication is too expensive, please contact our office at [phone] 4 or send [place] message through [place]   We are unable to tell what your co-pay for medications will  be in advance as this is different depending on your insurance coverage. However, we may be able to find a substitute medication at lower cost or fill out paperwork to get insurance to cover a needed medication.   If a prior authorization is required to get your medication covered by your insurance company, please allow [place] 1-2 business days to complete this process.  Drug prices often vary depending on where the prescription is filled and some pharmacies may offer cheaper prices.  The website www.goodrx.com contains coupons for medications through different pharmacies. The prices here do not account for what the cost may be with help from insurance (it may be cheaper with your insurance), but the website can give you the price if you did not use any insurance.  - You can print the associated coupon and take it with your prescription to the pharmacy.  - You may also stop by our office during regular business hours and pick up a GoodRx coupon card.  - If you need your prescription sent electronically to a different pharmacy, notify our office through [facility] or by phone at [phone] option 4.     Si Usted Necesita Algo Despus de Su Visita  Tambin puede enviarnos un mensaje a travs de Pharmacist, community. Por lo general respondemos a los mensajes de MyChart en el transcurso de 1 a 2 das hbiles.  Para renovar recetas, por favor pida a su farmacia que se ponga en contacto con nuestra oficina. Harland Dingwall de fax es [place] [phone].  Si tiene un asunto urgente cuando la clnica est cerrada y que no puede esperar hasta el siguiente da hbil, puede llamar/localizar a su doctor(a) al nmero que aparece a continuacin.   Por favor, tenga en cuenta que aunque hacemos todo lo posible para estar disponibles para asuntos urgentes fuera del horario de [place], no estamos disponibles las 24 horas del da, los 7 das de la [place].   Si tiene un problema urgente y no puede comunicarse con nosotros, puede optar por buscar atencin mdica  en el consultorio de su doctor(a), en una clnica privada, en un centro de atencin urgente o en una sala de emergencias.  Si tiene Engineering geologist, por favor llame inmediatamente al 911 o vaya a la sala de emergencias.  Nmeros de bper  - Dr. Nehemiah Massed: [phone]  - Dra. Moye: [phone]  - Dra. Nicole Kindred: [phone]  En caso de inclemencias del [place], por favor llame a Johnsie Kindred principal al [phone] para una actualizacin sobre el [place] de cualquier retraso o cierre.  Consejos para la medicacin en dermatologa: Por favor, guarde las cajas en las que vienen los medicamentos de uso tpico para ayudarle a seguir las instrucciones sobre dnde y cmo usarlos. Las farmacias generalmente imprimen las instrucciones del medicamento slo en las cajas y no directamente en los tubos del [place].   Si su medicamento es muy caro, por favor, pngase en contacto con Zigmund Daniel llamando al [phone] y presione la opcin 4 o envenos un mensaje a travs de Pharmacist, community.   No podemos decirle cul  ser su copago por los medicamentos por adelantado ya que esto es diferente dependiendo de la cobertura de su seguro. Sin embargo, es posible que podamos encontrar un medicamento sustituto a Electrical engineer un formulario para que el seguro cubra el medicamento que se considera necesario.   Si se requiere una autorizacin previa para que su compaa de seguros [place] su medicamento, por favor permtanos de 1  a 2 das hbiles para completar este proceso.  Los precios de los medicamentos varan con frecuencia dependiendo del Environmental consultant de dnde se surte la receta y alguna farmacias pueden ofrecer precios ms baratos.  El sitio web www.goodrx.com tiene cupones para medicamentos de Airline pilot. Los precios aqu no tienen en cuenta lo que podra costar con la ayuda del seguro (puede ser ms barato con su seguro), pero el sitio web puede darle el precio si no utiliz Research scientist (physical sciences).  - Puede imprimir el cupn correspondiente y llevarlo con su receta a la farmacia.  - Tambin puede pasar por nuestra oficina durante el horario de atencin regular y Charity fundraiser una tarjeta de cupones de GoodRx.  - Si necesita que su receta se enve electrnicamente a una farmacia diferente, informe a nuestra oficina a travs de MyChart de [place] o por telfono llamando al [phone] y presione la opcin 4.

## 2022-06-11 NOTE — Progress Notes (Unsigned)
Follow-Up Visit   Subjective  Meredith [place] is a 71 y.o. female who presents for the following: Annual Exam (Hx of Aks. No personal Hx of skin cancer or dysplastic nevi) and Psoriasis (PsA. Recently started on Taltz by rheumatologist. Recently re-started MTX, taking 2.'5mg'$  4 per week. C/O areas in scalp. In the past has used Calcipotriene cream, Halobetasol ointment and Triamcinolone cream).  The patient presents for Total-Body Skin Exam (TBSE) for skin cancer screening and mole check.  The patient has spots, moles and lesions to be evaluated, some may be new or changing and the patient has concerns that these could be cancer.  Husband with patient.   The following portions of the chart were reviewed this encounter and updated as appropriate:  Tobacco  Allergies  Meds  Problems  Med Hx  Surg Hx  Fam Hx      Review of Systems: No other skin or systemic complaints except as noted in HPI or Assessment and Plan.   Objective  Well appearing patient in no apparent distress; mood and affect are within normal limits.  A full examination was performed including scalp, head, eyes, ears, nose, lips, neck, chest, axillae, abdomen, back, buttocks, bilateral upper extremities, bilateral lower extremities, hands, feet, fingers, toes, fingernails, and toenails. All findings within normal limits unless otherwise noted below.  Scalp Scalp clear today   Left Crown Scalp x1, forehead x2, nose x2 (5) Erythematous thin papules/macules with gritty scale.   Left Thigh - Posterior 0.25cm med-dark brown thin papule        Assessment & Plan   Lentigines - Scattered tan macules - Due to sun exposure - Benign-appearing, observe - Recommend daily broad spectrum sunscreen SPF 30+ to sun-exposed areas, reapply every 2 hours as needed. - Call for any changes  Seborrheic Keratoses - Stuck-on, waxy, tan-brown papules and/or plaques  - Benign-appearing - Discussed benign etiology and  prognosis. - Observe - Call for any changes  Melanocytic Nevi - Tan-brown and/or pink-flesh-colored symmetric macules and papules - Benign appearing on exam today - Observation - Call clinic for new or changing moles - Recommend daily use of broad spectrum spf 30+ sunscreen to sun-exposed areas.   Hemangiomas - Red papules - Discussed benign nature - Observe - Call for any changes  Actinic Damage - Chronic condition, secondary to cumulative UV/sun exposure - diffuse scaly erythematous macules with underlying dyspigmentation - Recommend daily broad spectrum sunscreen SPF 30+ to sun-exposed areas, reapply every 2 hours as needed.  - Staying in the shade or wearing long sleeves, sun glasses (UVA+UVB protection) and wide brim hats (4-inch brim around the entire circumference of the hat) are also recommended for sun protection.  - Call for new or changing lesions.  Skin cancer screening performed today.  Psoriasis Scalp  Currently clear  Psoriasis is a chronic non-curable, but treatable genetic/hereditary disease that may have other systemic features affecting other organ systems such as joints (Psoriatic Arthritis). It is associated with an increased risk of inflammatory bowel disease, heart disease, non-alcoholic fatty liver disease, and depression.    Wynzora cream samples given to be applied once daily as needed  On Talz and MTX per rheumatology  AK (actinic keratosis) (5) Left Crown Scalp x1, forehead x2, nose x2  Actinic keratoses are precancerous spots that appear secondary to cumulative UV radiation exposure/sun exposure over time. They are chronic with expected duration over 1 year. A portion of actinic keratoses will progress to squamous cell carcinoma of the skin.  It is not possible to reliably predict which spots will progress to skin cancer and so treatment is recommended to prevent development of skin cancer.  Recommend daily broad spectrum sunscreen SPF 30+ to  sun-exposed areas, reapply every 2 hours as needed.  Recommend staying in the shade or wearing long sleeves, sun glasses (UVA+UVB protection) and wide brim hats (4-inch brim around the entire circumference of the hat). Call for new or changing lesions.  Destruction of lesion - Left Crown Scalp x1, forehead x2, nose x2  Destruction method: cryotherapy   Informed consent: discussed and consent obtained   Lesion destroyed using liquid nitrogen: Yes   Region frozen until ice ball extended beyond lesion: Yes   Outcome: patient tolerated procedure well with no complications   Post-procedure details: wound care instructions given   Additional details:  Prior to procedure, discussed risks of blister formation, small wound, skin dyspigmentation, or rare scar following cryotherapy. Recommend Vaseline ointment to treated areas while healing.   Neoplasm of skin Left Thigh - Posterior  Epidermal / dermal shaving  Lesion diameter (cm):  0.3 Informed consent: discussed and consent obtained   Patient was prepped and draped in usual sterile fashion: Area prepped with alcohol. Anesthesia: the lesion was anesthetized in a standard fashion   Anesthetic:  1% lidocaine w/ epinephrine 1-100,000 buffered w/ 8.4% NaHCO3 Instrument used: flexible razor blade   Hemostasis achieved with: pressure, aluminum chloride and electrodesiccation   Outcome: patient tolerated procedure well   Post-procedure details: wound care instructions given   Post-procedure details comment:  Ointment and small bandage applied  Specimen 1 - Surgical pathology Differential Diagnosis: R/O atypia  Check Margins: No   Return in about 1 year (around 06/12/2023) for TBSE, AK Follow Up in 3-4 months.  I, Emelia Salisbury, CMA, am acting as scribe for Forest Gleason, MD.  Documentation: I have reviewed the above documentation for accuracy and completeness, and I agree with the above.  Forest Gleason, MD

## 2022-06-12 ENCOUNTER — Encounter: Payer: Self-pay | Admitting: Dermatology

## 2022-06-19 ENCOUNTER — Ambulatory Visit
Admission: RE | Admit: 2022-06-19 | Discharge: 2022-06-19 | Disposition: A | Discharge status: Home / Self Care | Payer: Medicare Other | Source: Ambulatory Visit | Attending: Family Medicine | Admitting: Family Medicine

## 2022-06-19 DIAGNOSIS — Z1231 Encounter for screening mammogram for malignant neoplasm of breast: Secondary | ICD-10-CM | POA: Diagnosis not present

## 2022-06-20 ENCOUNTER — Telehealth: Payer: Self-pay

## 2022-06-20 NOTE — Telephone Encounter (Signed)
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 Patient prefers shave removal> excision. Scheduled patient 08/06/22 at 9:30am. Lurlean Horns., RMA

## 2022-06-20 NOTE — Telephone Encounter (Signed)
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 After stitches, she would need to take it easy (no lifting over 10-15 lbs and avoid exercise that gets heart rate and blood pressure up for 2 weeks). If we did the shave removal, she would not have activity restrictions but it would likely take several weeks to heal the open wound given the location on her leg.   Please give my apologies I cannot call personally as I have laryngitis, but if she has questions, I am happy to talk with her next week when my voice is back.   MAs please call. Thank you!

## 2022-06-27 ENCOUNTER — Encounter: Payer: Medicare Other | Admitting: Dermatology

## 2022-07-22 ENCOUNTER — Ambulatory Visit (INDEPENDENT_AMBULATORY_CARE_PROVIDER_SITE_OTHER): Payer: Medicare Other | Admitting: Family Medicine

## 2022-07-22 ENCOUNTER — Encounter: Payer: Self-pay | Admitting: Family Medicine

## 2022-07-22 VITALS — BP 150/75 | HR 64 | Wt 134.8 lb

## 2022-07-22 DIAGNOSIS — M069 Rheumatoid arthritis, unspecified: Secondary | ICD-10-CM | POA: Diagnosis not present

## 2022-07-22 DIAGNOSIS — R7989 Other specified abnormal findings of blood chemistry: Secondary | ICD-10-CM

## 2022-07-22 DIAGNOSIS — F439 Reaction to severe stress, unspecified: Secondary | ICD-10-CM

## 2022-07-22 DIAGNOSIS — Z79899 Other long term (current) drug therapy: Secondary | ICD-10-CM

## 2022-07-22 DIAGNOSIS — D84821 Immunodeficiency due to drugs: Secondary | ICD-10-CM

## 2022-07-22 DIAGNOSIS — I679 Cerebrovascular disease, unspecified: Secondary | ICD-10-CM

## 2022-07-22 DIAGNOSIS — I10 Essential (primary) hypertension: Secondary | ICD-10-CM

## 2022-07-22 NOTE — Progress Notes (Signed)
I,Sha'taria Tyson,acting as a Education administrator for Lelon Huh, MD.,have documented all relevant documentation on the behalf of Lelon Huh, MD,as directed by  Lelon Huh, MD while in the presence of Lelon Huh, MD.   Established patient visit   Patient: Meredith Russell   DOB: Mar 10, 1951   72 y.o. Female  MRN: [id number] Visit Date: 07/22/2022  Today's healthcare provider: Lelon Huh, MD   No chief complaint on file.  Subjective    HPI  Hypertension, follow-up  BP Readings from Last 3 Encounters:  06/11/22 132/76  03/22/22 (!) 156/87  02/20/22 (!) 144/74   Wt Readings from Last 3 Encounters:  03/22/22 128 lb (58.1 kg)  02/20/22 128 lb (58.1 kg)  12/11/21 134 lb (60.8 kg)     She was last seen for hypertension 4 months ago.  BP at that visit was 156/87. Management since that visit includes continue current medications.  She reports excellent compliance with treatment. She is not having side effects.   Use of agents associated with hypertension: none.   Outside blood pressures are typically in the 120-130s/60s. Symptoms: No chest pain No chest pressure  No palpitations No syncope  No dyspnea No orthopnea  No paroxysmal nocturnal dyspnea No lower extremity edema   Pertinent labs Lab Results  Component Value Date   CHOL 205 (H) 03/09/2019   HDL 80 03/09/2019   LDLCALC 107 (H) 03/09/2019   TRIG 101 03/09/2019   CHOLHDL 2.6 03/09/2019   Lab Results  Component Value Date   NA 138 01/28/2020   K 3.9 01/28/2020   CREATININE 0.8 01/28/2020   GFRNONAA 71 01/28/2020   GLUCOSE 95 03/09/2019   TSH 5.93 (A) 01/20/2020     The ASCVD Risk score (Arnett DK, et al., 2019) failed to calculate for the following reasons:   Cannot find a previous HDL lab   Cannot find a previous total cholesterol lab  ---------------------------------------------------------------------------------------------------  Follow up for situational stress  The patient was last seen for  this 4 months ago. Changes made at last visit include prescribing prn lorazepam.  She reports excellent compliance with treatment. She feels that condition is Improved. She is not having side effects.  She states she only takes lorazeapam as needed which is typically just a few times a week.   -----------------------------------------------------------------------------------------   Medications: Outpatient Medications Prior to Visit  Medication Sig   amLODipine (NORVASC) 5 MG tablet TAKE 1 TABLET BY MOUTH  DAILY   benzonatate (TESSALON) 200 MG capsule Take by mouth.   butalbital-acetaminophen-caffeine (FIORICET) 50-325-40 MG tablet TAKE 1-2 TABLET BY MOUTH EVERY 6 (SIX) HOURS AS NEEDED FOR HEADACHE.   calcipotriene (DOVONOX) 0.005 % cream Apply 1 application  topically 3 (three) times daily as needed (psoriasis).   Cholecalciferol (VITAMIN D3) 1000 units CAPS Take 1,000 Units by mouth daily.    diclofenac (VOLTAREN) 50 MG EC tablet Take 50 mg by mouth daily.    diclofenac Sodium (VOLTAREN) 1 % GEL Apply topically.   fexofenadine (ALLEGRA) 180 MG tablet TAKE 1 TABLET BY MOUTH EVERY DAY   fluticasone (FLONASE) 50 MCG/ACT nasal spray USE 2 SPRAYS IN BOTH  NOSTRILS DAILY AS NEEDED  FOR ALLERGIES   folic acid (FOLVITE) 1 MG tablet Take 1 mg by mouth daily.   halobetasol (ULTRAVATE) 0.05 % ointment Apply 1 application topically daily as needed (psoriasis).    LORazepam (ATIVAN) 1 MG tablet Take 0.5-1 tablets (0.5-1 mg total) by mouth 2 (two) times daily as needed for anxiety.  methotrexate (RHEUMATREX) 2.5 MG tablet Take 4 tablets by mouth daily.   oxyCODONE-acetaminophen (PERCOCET) 7.5-325 MG per tablet Take 1 tablet by mouth every 6 (six) hours as needed for severe pain.    pramoxine-hydrocortisone (ANALPRAM-HC) 1-1 % rectal cream Place 1 application rectally 2 (two) times daily. (Patient taking differently: Place 1 application  rectally 2 (two) times daily as needed for hemorrhoids.)    TALTZ 80 MG/ML SOAJ Inject into the skin.   tiZANidine (ZANAFLEX) 2 MG tablet Take 2 mg by mouth 3 (three) times daily.   tiZANidine (ZANAFLEX) 4 MG tablet Take 4 mg by mouth 3 (three) times daily.   triamcinolone cream (KENALOG) 0.5 % Apply 1 application topically 2 (two) times daily. As needed   vitamin B-12 (CYANOCOBALAMIN) 1000 MCG tablet Take 1,000 mcg by mouth daily.    vitamin C (ASCORBIC ACID) 500 MG tablet Take 500 mg by mouth daily.    zolpidem (AMBIEN) 10 MG tablet TAKE 1 TABLET BY MOUTH EVERYDAY AT BEDTIME   aspirin 81 MG tablet Take 81 mg by mouth daily. (Patient not taking: Reported on 07/22/2022)   budesonide (ENTOCORT EC) 3 MG 24 hr capsule Take 9 mg by mouth every morning. (Patient not taking: Reported on 07/22/2022)   etanercept (ENBREL SURECLICK) 50 MG/ML injection INJECT '50MG'$  SUBCUTANEOUSLY ONCE WEEKLY  AS DIRECTED. (Patient not taking: Reported on 07/22/2022)   leflunomide (ARAVA) 20 MG tablet Take 20 mg by mouth daily.  (Patient not taking: Reported on 07/22/2022)   promethazine (PHENERGAN) 25 MG tablet Take 1 tablet (25 mg total) by mouth every 8 (eight) hours as needed for nausea or vomiting. (Patient not taking: Reported on 07/22/2022)   No facility-administered medications prior to visit.    Review of Systems  Constitutional:  Negative for appetite change, chills, fatigue and fever.  Respiratory:  Negative for chest tightness and shortness of breath.   Cardiovascular:  Negative for chest pain and palpitations.  Gastrointestinal:  Negative for abdominal pain, nausea and vomiting.  Neurological:  Negative for dizziness and weakness.       Objective    BP (!) 150/75 (BP Location: Left Arm, Patient Position: Sitting, Cuff Size: Normal)   Pulse 64   Wt 134 lb 12.8 oz (61.1 kg)   SpO2 100%   BMI 23.14 kg/m    Physical Exam  General appearance: Well developed, well nourished female, cooperative and in no acute distress Head: Normocephalic, without obvious  abnormality, atraumatic Respiratory: Respirations even and unlabored, normal respiratory rate Extremities: All extremities are intact.  Skin: Skin color, texture, turgor normal. No rashes seen  Psych: Appropriate mood and affect. Neurologic: Mental status: Alert, oriented to person, place, and time, thought content appropriate.   Assessment & Plan     1. Situational stress Improved, does well with occasional lorazepam.   2. Primary hypertension Home Bps well controlled. Continue current medications.   - Comprehensive metabolic panel  3. Cerebrovascular disease  - Lipid panel  4. Rheumatoid arthritis, involving unspecified site, unspecified whether rheumatoid factor present ([place]) Continue current DMRs per rheumatology.   5. Immunocompromised state due to drug therapy ([place]) Secondary to rheumatologic medications. UTD on pneumonia vaccine, declined flu vaccine.   6. Elevated TSH  - TSH - T4, free     The entirety of the information documented in the History of Present Illness, Review of Systems and Physical Exam were personally obtained by me. Portions of this information were initially documented by the CMA and reviewed by me for thoroughness  and accuracy.     Lelon Huh, MD  [facility] [phone] (phone) [phone] (fax)  [place]

## 2022-07-23 LAB — COMPREHENSIVE METABOLIC PANEL
ALT: 130 IU/L — ABNORMAL HIGH (ref 0–32)
AST: 85 IU/L — ABNORMAL HIGH (ref 0–40)
Albumin/Globulin Ratio: 1.8 (ref 1.2–2.2)
Albumin: 4.2 g/dL (ref 3.8–4.8)
Alkaline Phosphatase: 139 IU/L — ABNORMAL HIGH (ref 44–121)
BUN/Creatinine Ratio: 20 (ref 12–28)
BUN: 18 mg/dL (ref 8–27)
Bilirubin Total: 0.5 mg/dL (ref 0.0–1.2)
CO2: 25 mmol/L (ref 20–29)
Calcium: 9.2 mg/dL (ref 8.7–10.3)
Chloride: 101 mmol/L (ref 96–106)
Creatinine, Ser: 0.91 mg/dL (ref 0.57–1.00)
Globulin, Total: 2.3 g/dL (ref 1.5–4.5)
Glucose: 95 mg/dL (ref 70–99)
Potassium: 5.3 mmol/L — ABNORMAL HIGH (ref 3.5–5.2)
Sodium: 140 mmol/L (ref 134–144)
Total Protein: 6.5 g/dL (ref 6.0–8.5)
eGFR: 67 mL/min/{1.73_m2} (ref >59)

## 2022-07-23 LAB — LIPID PANEL
Chol/HDL Ratio: 1.9 ratio (ref 0.0–4.4)
Cholesterol, Total: 223 mg/dL — ABNORMAL HIGH (ref 100–199)
HDL: 115 mg/dL (ref >39)
LDL Chol Calc (NIH): 96 mg/dL (ref 0–99)
Triglycerides: 67 mg/dL (ref 0–149)
VLDL Cholesterol Cal: 12 mg/dL (ref 5–40)

## 2022-07-23 LAB — TSH: TSH: 1.9 u[IU]/mL (ref 0.450–4.500)

## 2022-07-23 LAB — T4, FREE: Free T4: 1.29 ng/dL (ref 0.82–1.77)

## 2022-08-06 ENCOUNTER — Ambulatory Visit (INDEPENDENT_AMBULATORY_CARE_PROVIDER_SITE_OTHER): Payer: Medicare Other | Admitting: Dermatology

## 2022-08-06 ENCOUNTER — Encounter: Payer: Self-pay | Admitting: Dermatology

## 2022-08-06 DIAGNOSIS — D485 Neoplasm of uncertain behavior of skin: Secondary | ICD-10-CM

## 2022-08-06 MED ORDER — MUPIROCIN 2 % EX OINT: 1.0000 | TOPICAL_OINTMENT | Freq: Every day | CUTANEOUS | 0 refills | Status: DC

## 2022-08-06 NOTE — Progress Notes (Signed)
   Follow-Up Visit   Subjective  Meredith Russell is a 72 y.o. 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 ).   The following portions of the chart were reviewed this encounter and updated as appropriate:   Tobacco  Allergies  Meds  Problems  Med Hx  Surg Hx  Fam Hx      Review of Systems:  No other skin or systemic complaints except as noted in HPI or Assessment and Plan.  Objective  Well appearing patient in no apparent distress; mood and affect are within normal limits.  A focused examination was performed including left leg. Relevant physical exam findings are noted in the Assessment and Plan.  left posterior thigh Healing bx site    Assessment & Plan  Neoplasm of uncertain behavior of skin left posterior thigh  Epidermal / dermal shaving  Lesion diameter (cm):  0.8 Informed consent: discussed and consent obtained   Timeout: patient name, date of birth, surgical site, and procedure verified   Anesthesia: the lesion was anesthetized in a standard fashion   Anesthetic:  1% lidocaine w/ epinephrine 1-100,000 local infiltration Instrument used: flexible razor blade   Hemostasis achieved with: aluminum chloride   Outcome: patient tolerated procedure well   Post-procedure details: wound care instructions given   Additional details:  Mupirocin and a bandage applied  mupirocin ointment (BACTROBAN) 2 % Apply 1 Application topically daily.  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 Pt happy with shave removal option as she is active. Discussed if something came back or if there was residual atypical mole on the shave removal, we may suggest excision.    Return for TBSE, as scheduled.  Graciella Belton, RMA, am  acting as scribe for Forest Gleason, MD .  Documentation: I have reviewed the above documentation for accuracy and completeness, and I agree with the above.  Forest Gleason, MD

## 2022-08-06 NOTE — Patient Instructions (Addendum)
- Can use OTC arnica containing moisturizer such as Dermend Bruise Formula if desired   Wound Care Instructions  Cleanse wound gently with Hibiclens/Chlorhexidine and water once a day then pat dry with clean gauze. Apply thin coat of StrataGRT twice a day with a clean Qtip.  If you run out of StrataGRT, switch to the following  ---->  Apply a thin coat of Petrolatum (petroleum jelly, "Vaseline") over the wound (unless you have an allergy to this). We recommend that you use a new, sterile tube of Vaseline. Do not pick or remove scabs. Do not remove the yellow or white "healing tissue" from the base of the wound.  Cover the wound with fresh, clean, nonstick gauze and secure with paper tape. You may use Band-Aids in place of gauze and tape if the wound is small enough, but would recommend trimming much of the tape off as there is often too much. Sometimes Band-Aids can irritate the skin.  You should call the office for your biopsy report after 1 week if you have not already been contacted.  If you experience any problems, such as abnormal amounts of bleeding, swelling, significant bruising, significant pain, or evidence of infection, please call the office immediately.  FOR ADULT SURGERY PATIENTS: If you need something for pain relief you may take 1 extra strength Tylenol (acetaminophen) AND 2 Ibuprofen ('200mg'$  each) together every 4 hours as needed for pain. (do not take these if you are allergic to them or if you have a reason you should not take them.) Typically, you may only need pain medication for 1 to 3 days.    Due to recent changes in healthcare laws, you may see results of your pathology and/or laboratory studies on MyChart before the doctors have had a chance to review them. We understand that in some cases there may be results that are confusing or concerning to you. Please understand that not all results are received at the same time and often the doctors may need to interpret multiple  results in order to provide you with the best plan of care or course of treatment. Therefore, we ask that you please give [place] 2 business days to thoroughly review all your results before contacting the office for clarification. Should we see a critical lab result, you will be contacted sooner.   If You Need Anything After Your Visit  If you have any questions or concerns for your doctor, please call our main line at [phone] and press option 4 to reach your doctor's medical assistant. If no one answers, please leave a voicemail as directed and we will return your call as soon as possible. Messages left after 4 pm will be answered the following business day.   You may also send [place] a message via [place]. We typically respond to MyChart messages within 1-2 business days.  For prescription refills, please ask your pharmacy to contact our office. Our fax number is [phone].  If you have an urgent issue when the clinic is closed that cannot wait until the next business day, you can page your doctor at the number below.    Please note that while we do our best to be available for urgent issues outside of office hours, we are not available 24/7.   If you have an urgent issue and are unable to reach [place], you may choose to seek medical care at your doctor's office, retail clinic, urgent care center, or emergency room.  If you have a medical emergency,  please immediately call 911 or go to the emergency department.  Pager Numbers  - Dr. Nehemiah Massed: [phone]  - Dr. Laurence Ferrari: [phone]  - Dr. Nicole Kindred: [phone]  In the event of inclement weather, please call our main line at [phone] for an update on the status of any delays or closures.  Dermatology Medication Tips: Please keep the boxes that topical medications come in in order to help keep track of the instructions about where and how to use these. Pharmacies typically print the medication instructions only on the boxes and not  directly on the medication tubes.   If your medication is too expensive, please contact our office at [phone] option 4 or send [place] a message through [place].   We are unable to tell what your co-pay for medications will be in advance as this is different depending on your insurance coverage. However, we may be able to find a substitute medication at lower cost or fill out paperwork to get insurance to cover a needed medication.   If a prior authorization is required to get your medication covered by your insurance company, please allow [place] 1-2 business days to complete this process.  Drug prices often vary depending on where the prescription is filled and some pharmacies may offer cheaper prices.  The website www.goodrx.com contains coupons for medications through different pharmacies. The prices here do not account for what the cost may be with help from insurance (it may be cheaper with your insurance), but the website can give you the price if you did not use any insurance.  - You can print the associated coupon and take it with your prescription to the pharmacy.  - You may also stop by our office during regular business hours and pick up a GoodRx coupon card.  - If you need your prescription sent electronically to a different pharmacy, notify our office through [facility] or by phone at [phone] option 4.     Si Usted Necesita Algo Despus de Su Visita  Tambin puede enviarnos un mensaje a travs de Pharmacist, community. Por lo general respondemos a los mensajes de MyChart en el transcurso de 1 a 2 das hbiles.  Para renovar recetas, por favor pida a su farmacia que se ponga en contacto con nuestra oficina. Harland Dingwall de fax es [place] [phone].  Si tiene un asunto urgente cuando la clnica est cerrada y que no puede esperar hasta el siguiente da hbil, puede llamar/localizar a su doctor(a) al nmero que aparece a continuacin.   Por favor, tenga en cuenta que aunque hacemos  todo lo posible para estar disponibles para asuntos urgentes fuera del horario de [place], no estamos disponibles las 24 horas del da, los 7 das de la [place].   Si tiene un problema urgente y no puede comunicarse con nosotros, puede optar por buscar atencin mdica  en el consultorio de su doctor(a), en una clnica privada, en un centro de atencin urgente o en una sala de emergencias.  Si tiene Engineering geologist, por favor llame inmediatamente al 911 o vaya a la sala de emergencias.  Nmeros de bper  - Dr. Nehemiah Massed: [phone]  - Dra. Moye: [phone]  - Dra. Nicole Kindred: [phone]  En caso de inclemencias del [employer], por favor llame a Johnsie Kindred principal al [phone] para una actualizacin sobre el [place] de cualquier retraso o cierre.  Consejos para la medicacin en dermatologa: Por favor, guarde las cajas en las que vienen los medicamentos de uso tpico para ayudarle a seguir  las instrucciones sobre dnde y cmo usarlos. Las farmacias generalmente imprimen las instrucciones del medicamento slo en las cajas y no directamente en los tubos del [place].   Si su medicamento es muy caro, por favor, pngase en contacto con Zigmund Daniel llamando al [phone] y presione la opcin 4 o envenos un mensaje a travs de Pharmacist, community.   No podemos decirle cul ser su copago por los medicamentos por adelantado ya que esto es diferente dependiendo de la cobertura de su seguro. Sin embargo, es posible que podamos encontrar un medicamento sustituto a Electrical engineer un formulario para que el seguro cubra el medicamento que se considera necesario.   Si se requiere una autorizacin previa para que su compaa de seguros [place] su medicamento, por favor permtanos de 1 a 2 das hbiles para completar este proceso.  Los precios de los medicamentos varan con frecuencia dependiendo del Environmental consultant de dnde se surte la receta y alguna farmacias pueden ofrecer precios ms baratos.  El  sitio web www.goodrx.com tiene cupones para medicamentos de Airline pilot. Los precios aqu no tienen en cuenta lo que podra costar con la ayuda del seguro (puede ser ms barato con su seguro), pero el sitio web puede darle el precio si no utiliz Research scientist (physical sciences).  - Puede imprimir el cupn correspondiente y llevarlo con su receta a la farmacia.  - Tambin puede pasar por nuestra oficina durante el horario de atencin regular y Charity fundraiser una tarjeta de cupones de GoodRx.  - Si necesita que su receta se enve electrnicamente a una farmacia diferente, informe a nuestra oficina a travs de MyChart de [place] o por telfono llamando al [phone] y presione la opcin 4.

## 2022-08-12 ENCOUNTER — Telehealth: Payer: Self-pay

## 2022-08-12 ENCOUNTER — Other Ambulatory Visit: Payer: Self-pay | Admitting: Family Medicine

## 2022-08-12 DIAGNOSIS — F439 Reaction to severe stress, unspecified: Secondary | ICD-10-CM

## 2022-08-12 DIAGNOSIS — G4700 Insomnia, unspecified: Secondary | ICD-10-CM

## 2022-08-12 NOTE — Telephone Encounter (Signed)
Patient called asking for samples of Rinvoq.  Patient states she was approved by Iantha Fallen through Rheumatologist but unable to provide copy of approval. She was upset that I would not supply samples today and needed to wait and discuss with you.  She at first denied samples if I could not get them today but then states she guesses she would take them tomorrow. Per patient she will have her delivery later this week/earlier next week.

## 2022-08-13 NOTE — Telephone Encounter (Signed)
Patient updated that her shipment comes in today so she declines samples today.

## 2022-08-14 ENCOUNTER — Telehealth: Payer: Self-pay

## 2022-08-14 NOTE — Telephone Encounter (Addendum)
Patient called and given results. She denied further questions and states she is healing.    ----- Message from Alfonso Patten, MD sent at 08/14/2022  4:38 PM EST ----- Skin , left posterior thigh NO RESIDUAL DYSPLASTIC NEVUS, MARGINS FREE --> Entire lesion appears to be out. No additional treatment needed at this time. Please call our office [phone] with any questions before follow-up.   MAs please call. Thank you!

## 2022-08-18 ENCOUNTER — Other Ambulatory Visit: Payer: Self-pay | Admitting: Family Medicine

## 2022-08-18 DIAGNOSIS — R7401 Elevation of levels of liver transaminase levels: Secondary | ICD-10-CM

## 2022-10-03 ENCOUNTER — Ambulatory Visit: Payer: Medicare Other | Admitting: Dermatology

## 2022-10-07 ENCOUNTER — Encounter: Payer: Self-pay | Admitting: Family Medicine

## 2022-10-08 ENCOUNTER — Ambulatory Visit (INDEPENDENT_AMBULATORY_CARE_PROVIDER_SITE_OTHER): Payer: Medicare Other | Admitting: Dermatology

## 2022-10-08 ENCOUNTER — Encounter: Payer: Self-pay | Admitting: Dermatology

## 2022-10-08 VITALS — BP 139/75 | HR 71

## 2022-10-08 DIAGNOSIS — C4492 Squamous cell carcinoma of skin, unspecified: Secondary | ICD-10-CM

## 2022-10-08 DIAGNOSIS — C44529 Squamous cell carcinoma of skin of other part of trunk: Secondary | ICD-10-CM | POA: Diagnosis not present

## 2022-10-08 DIAGNOSIS — D492 Neoplasm of unspecified behavior of bone, soft tissue, and skin: Secondary | ICD-10-CM

## 2022-10-08 HISTORY — DX: Squamous cell carcinoma of skin, unspecified: C44.92

## 2022-10-08 NOTE — Progress Notes (Signed)
   Follow-Up Visit   Subjective  Meredith Russell is a 72 y.o. female who presents for the following: Spot. Chest. Very painful. Dur: 1-2 months  The patient has spots, moles and lesions to be evaluated, some may be new or changing and the patient has concerns that these could be cancer.  Patient accompanied by sister.   The following portions of the chart were reviewed this encounter and updated as appropriate: medications, allergies, medical history  Review of Systems:  No other skin or systemic complaints except as noted in HPI or Assessment and Plan.  Objective  Well appearing patient in no apparent distress; mood and affect are within normal limits.  A focused examination was performed of the following areas: Chest Relevant physical exam findings are noted in the Assessment and Plan.  mid chest right of mid line 0.6 cm firm pink papule         Assessment & Plan   Neoplasm of skin mid chest right of mid line  Epidermal / dermal shaving  Lesion diameter (cm):  0.6 Informed consent: discussed and consent obtained   Patient was prepped and draped in usual sterile fashion: Area prepped with alcohol. Anesthesia: the lesion was anesthetized in a standard fashion   Anesthetic:  1% lidocaine w/ epinephrine 1-100,000 buffered w/ 8.4% NaHCO3 Instrument used: flexible razor blade   Hemostasis achieved with: pressure, aluminum chloride and electrodesiccation   Outcome: patient tolerated procedure well   Post-procedure details: wound care instructions given   Post-procedure details comment:  Ointment and small bandage applied  Destruction of lesion  Destruction method: electrodesiccation and curettage   Informed consent: discussed and consent obtained   Timeout:  patient name, date of birth, surgical site, and procedure verified Anesthesia: the lesion was anesthetized in a standard fashion   Anesthetic:  1% lidocaine w/ epinephrine 1-100,000 buffered w/ 8.4%  NaHCO3 Curettage performed in three different directions: Yes   Electrodesiccation performed over the curetted area: Yes   Curettage cycles:  3 Final wound size (cm):  0.9 Hemostasis achieved with:  electrodesiccation Outcome: patient tolerated procedure well with no complications   Post-procedure details: sterile dressing applied and wound care instructions given   Dressing type: petrolatum    Specimen 1 - Surgical pathology Differential Diagnosis: R/O SCC, KA-type  Check Margins: No     Return for Biopsy Follow Up 6-7 weeks.  I, Emelia Salisbury, CMA, am acting as scribe for Forest Gleason, MD.   Documentation: I have reviewed the above documentation for accuracy and completeness, and I agree with the above.  Forest Gleason, MD

## 2022-10-08 NOTE — Patient Instructions (Addendum)
Wound Care Instructions  Cleanse wound gently with soap and water once a day then pat dry with clean gauze. Apply a thin coat of Petrolatum (petroleum jelly, "Vaseline") over the wound (unless you have an allergy to this). We recommend that you use a new, sterile tube of Vaseline. Do not pick or remove scabs. Do not remove the yellow or white "healing tissue" from the base of the wound.  Cover the wound with fresh, clean, nonstick gauze and secure with paper tape. You may use Band-Aids in place of gauze and tape if the wound is small enough, but would recommend trimming much of the tape off as there is often too much. Sometimes Band-Aids can irritate the skin.  You should call the office for your biopsy report after 1 week if you have not already been contacted.  If you experience any problems, such as abnormal amounts of bleeding, swelling, significant bruising, significant pain, or evidence of infection, please call the office immediately.  FOR ADULT SURGERY PATIENTS: If you need something for pain relief you may take 1 extra strength Tylenol (acetaminophen) AND 2 Ibuprofen (200mg each) together every 4 hours as needed for pain. (do not take these if you are allergic to them or if you have a reason you should not take them.) Typically, you may only need pain medication for 1 to 3 days.     Due to recent changes in healthcare laws, you may see results of your pathology and/or laboratory studies on MyChart before the doctors have had a chance to review them. We understand that in some cases there may be results that are confusing or concerning to you. Please understand that not all results are received at the same time and often the doctors may need to interpret multiple results in order to provide you with the best plan of care or course of treatment. Therefore, we ask that you please give us 2 business days to thoroughly review all your results before contacting the office for clarification. Should  we see a critical lab result, you will be contacted sooner.   If You Need Anything After Your Visit  If you have any questions or concerns for your doctor, please call our main line at [phone] and press option 4 to reach your doctor's medical assistant. If no one answers, please leave a voicemail as directed and we will return your call as soon as possible. Messages left after 4 pm will be answered the following business day.   You may also send us a message via MyChart. We typically respond to MyChart messages within 1-2 business days.  For prescription refills, please ask your pharmacy to contact our office. Our fax number is [phone].  If you have an urgent issue when the clinic is closed that cannot wait until the next business day, you can page your doctor at the number below.    Please note that while we do our best to be available for urgent issues outside of office hours, we are not available 24/7.   If you have an urgent issue and are unable to reach us, you may choose to seek medical care at your doctor's office, retail clinic, urgent care center, or emergency room.  If you have a medical emergency, please immediately call 911 or go to the emergency department.  Pager Numbers  - Dr. Kowalski: [phone]  - Dr. Moye: [phone]  - Dr. Stewart: [phone]  In the event of inclement weather, please call our main line at   [phone] for an update on the status of any delays or closures.  Dermatology Medication Tips: Please keep the boxes that topical medications come in in order to help keep track of the instructions about where and how to use these. Pharmacies typically print the medication instructions only on the boxes and not directly on the medication tubes.   If your medication is too expensive, please contact our office at [phone] option 4 or send us a message through MyChart.   We are unable to tell what your co-pay for medications will be in  advance as this is different depending on your insurance coverage. However, we may be able to find a substitute medication at lower cost or fill out paperwork to get insurance to cover a needed medication.   If a prior authorization is required to get your medication covered by your insurance company, please allow us 1-2 business days to complete this process.  Drug prices often vary depending on where the prescription is filled and some pharmacies may offer cheaper prices.  The website www.goodrx.com contains coupons for medications through different pharmacies. The prices here do not account for what the cost may be with help from insurance (it may be cheaper with your insurance), but the website can give you the price if you did not use any insurance.  - You can print the associated coupon and take it with your prescription to the pharmacy.  - You may also stop by our office during regular business hours and pick up a GoodRx coupon card.  - If you need your prescription sent electronically to a different pharmacy, notify our office through [place] MyChart or by phone at [phone] option 4.     Si Usted Necesita Algo Despus de Su Visita  Tambin puede enviarnos un mensaje a travs de MyChart. Por lo general respondemos a los mensajes de MyChart en el transcurso de 1 a 2 das hbiles.  Para renovar recetas, por favor pida a su farmacia que se ponga en contacto con nuestra oficina. Nuestro nmero de fax es el [phone].  Si tiene un asunto urgente cuando la clnica est cerrada y que no puede esperar hasta el siguiente da hbil, puede llamar/localizar a su doctor(a) al nmero que aparece a continuacin.   Por favor, tenga en cuenta que aunque hacemos todo lo posible para estar disponibles para asuntos urgentes fuera del horario de oficina, no estamos disponibles las 24 horas del da, los 7 das de la semana.   Si tiene un problema urgente y no puede comunicarse con nosotros, puede  optar por buscar atencin mdica  en el consultorio de su doctor(a), en una clnica privada, en un centro de atencin urgente o en una sala de emergencias.  Si tiene una emergencia mdica, por favor llame inmediatamente al 911 o vaya a la sala de emergencias.  Nmeros de bper  - Dr. Kowalski: [phone]  - Dra. Moye: [phone]  - Dra. Stewart: [phone]  En caso de inclemencias del tiempo, por favor llame a nuestra lnea principal al [phone] para una actualizacin sobre el estado de cualquier retraso o cierre.  Consejos para la medicacin en dermatologa: Por favor, guarde las cajas en las que vienen los medicamentos de uso tpico para ayudarle a seguir las instrucciones sobre dnde y cmo usarlos. Las farmacias generalmente imprimen las instrucciones del medicamento slo en las cajas y no directamente en los tubos del medicamento.   Si su medicamento es muy caro, por favor, pngase en contacto con   nuestra oficina llamando al [phone] y presione la opcin 4 o envenos un mensaje a travs de MyChart.   No podemos decirle cul ser su copago por los medicamentos por adelantado ya que esto es diferente dependiendo de la cobertura de su seguro. Sin embargo, es posible que podamos encontrar un medicamento sustituto a menor costo o llenar un formulario para que el seguro cubra el medicamento que se considera necesario.   Si se requiere una autorizacin previa para que su compaa de seguros cubra su medicamento, por favor permtanos de 1 a 2 das hbiles para completar este proceso.  Los precios de los medicamentos varan con frecuencia dependiendo del lugar de dnde se surte la receta y alguna farmacias pueden ofrecer precios ms baratos.  El sitio web www.goodrx.com tiene cupones para medicamentos de diferentes farmacias. Los precios aqu no tienen en cuenta lo que podra costar con la ayuda del seguro (puede ser ms barato con su seguro), pero el sitio web puede darle el  precio si no utiliz ningn seguro.  - Puede imprimir el cupn correspondiente y llevarlo con su receta a la farmacia.  - Tambin puede pasar por nuestra oficina durante el horario de atencin regular y recoger una tarjeta de cupones de GoodRx.  - Si necesita que su receta se enve electrnicamente a una farmacia diferente, informe a nuestra oficina a travs de MyChart de [place] o por telfono llamando al [phone] y presione la opcin 4.  

## 2022-10-11 MED ORDER — CITALOPRAM HYDROBROMIDE 20 MG PO TABS: ORAL_TABLET | ORAL | 0 refills | Status: DC

## 2022-10-14 ENCOUNTER — Telehealth: Payer: Self-pay

## 2022-10-14 NOTE — Telephone Encounter (Signed)
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 Thank you!

## 2022-10-14 NOTE — Telephone Encounter (Signed)
Patient advised pathology showed SCC, already treated with Ed[facility]. Butch Penny., RMA

## 2022-11-03 ENCOUNTER — Other Ambulatory Visit: Payer: Self-pay | Admitting: Family Medicine

## 2022-11-03 DIAGNOSIS — G4700 Insomnia, unspecified: Secondary | ICD-10-CM

## 2022-11-04 ENCOUNTER — Encounter: Payer: Self-pay | Admitting: Family Medicine

## 2022-11-04 ENCOUNTER — Ambulatory Visit (INDEPENDENT_AMBULATORY_CARE_PROVIDER_SITE_OTHER): Payer: Medicare Other | Admitting: Family Medicine

## 2022-11-04 VITALS — BP 133/77 | HR 67 | Ht 64.0 in | Wt 140.0 lb

## 2022-11-04 DIAGNOSIS — I10 Essential (primary) hypertension: Secondary | ICD-10-CM | POA: Diagnosis not present

## 2022-11-04 DIAGNOSIS — G4700 Insomnia, unspecified: Secondary | ICD-10-CM | POA: Diagnosis not present

## 2022-11-04 DIAGNOSIS — I679 Cerebrovascular disease, unspecified: Secondary | ICD-10-CM | POA: Diagnosis not present

## 2022-11-04 DIAGNOSIS — F439 Reaction to severe stress, unspecified: Secondary | ICD-10-CM

## 2022-11-04 MED ORDER — CITALOPRAM HYDROBROMIDE 20 MG PO TABS: 20.0000 mg | ORAL_TABLET | Freq: Every day | ORAL | 0 refills | Status: DC

## 2022-11-04 MED ORDER — CITALOPRAM HYDROBROMIDE 20 MG PO TABS: 10.0000 mg | ORAL_TABLET | Freq: Every day | ORAL | 0 refills | Status: DC

## 2022-11-04 MED ORDER — ZOLPIDEM TARTRATE 10 MG PO TABS
ORAL_TABLET | ORAL | 1 refills | Status: DC
Start: 2022-11-04 — End: 2023-03-25

## 2022-11-04 NOTE — Progress Notes (Signed)
Established patient visit   Patient: Meredith Russell   DOB: 1951/06/11   72 y.o. Female  MRN: [id number] Visit Date: 11/04/2022  Today's healthcare provider: Mila Merry, MD    Subjective    HPI Follow up on hypertension, situations stress, insomnia. Overall doing better. She tried weaning off of Ambien but could sleep at all so has since restarted. She would like to cut on dose of citalopram.  Medications: Outpatient Medications Prior to Visit  Medication Sig   amLODipine (NORVASC) 5 MG tablet TAKE 1 TABLET BY MOUTH  DAILY   benzonatate (TESSALON) 200 MG capsule Take by mouth.   butalbital-acetaminophen-caffeine (FIORICET) 50-325-40 MG tablet TAKE 1-2 TABLET BY MOUTH EVERY 6 (SIX) HOURS AS NEEDED FOR HEADACHE.   calcipotriene (DOVONOX) 0.005 % cream Apply 1 application  topically 3 (three) times daily as needed (psoriasis).   Cholecalciferol (VITAMIN D3) 1000 units CAPS Take 1,000 Units by mouth daily.    citalopram (CELEXA) 20 MG tablet 1/2 tablet daily for 6 days then increase to 1 tablet daily   diclofenac (VOLTAREN) 50 MG EC tablet Take 50 mg by mouth daily.    diclofenac Sodium (VOLTAREN) 1 % GEL Apply topically.   fexofenadine (ALLEGRA) 180 MG tablet TAKE 1 TABLET BY MOUTH EVERY DAY   fluticasone (FLONASE) 50 MCG/ACT nasal spray USE 2 SPRAYS IN BOTH  NOSTRILS DAILY AS NEEDED  FOR ALLERGIES   folic acid (FOLVITE) 1 MG tablet Take 1 mg by mouth daily.   halobetasol (ULTRAVATE) 0.05 % ointment Apply 1 application topically daily as needed (psoriasis).    LORazepam (ATIVAN) 1 MG tablet TAKE ONE-HALF TO ONE TABLET BY MOUTH TWICE DAILY AS NEEDED FOR ANXIETY   mupirocin ointment (BACTROBAN) 2 % Apply 1 Application topically daily.   oxyCODONE-acetaminophen (PERCOCET) 7.5-325 MG per tablet Take 1 tablet by mouth every 6 (six) hours as needed for severe pain.    pramoxine-hydrocortisone (ANALPRAM-HC) 1-1 % rectal cream Place 1 application rectally 2 (two) times daily. (Patient  taking differently: Place 1 application  rectally 2 (two) times daily as needed for hemorrhoids.)   tiZANidine (ZANAFLEX) 2 MG tablet Take 2 mg by mouth 3 (three) times daily.   tiZANidine (ZANAFLEX) 4 MG tablet Take 4 mg by mouth 3 (three) times daily.   triamcinolone cream (KENALOG) 0.5 % Apply 1 application topically 2 (two) times daily. As needed   vitamin B-12 (CYANOCOBALAMIN) 1000 MCG tablet Take 1,000 mcg by mouth daily.    vitamin C (ASCORBIC ACID) 500 MG tablet Take 500 mg by mouth daily.    zolpidem (AMBIEN) 10 MG tablet TAKE ONE TABLET BY MOUTH ONE TIME DAILY AT BEDTIME   No facility-administered medications prior to visit.    Review of Systems  Constitutional:  Negative for appetite change, chills, fatigue and fever.  Respiratory:  Negative for chest tightness and shortness of breath.   Cardiovascular:  Negative for chest pain and palpitations.  Gastrointestinal:  Negative for abdominal pain, nausea and vomiting.  Neurological:  Negative for dizziness and weakness.       Objective    BP 133/77   Pulse 67   Ht 5\' 4"  (1.626 m)   Wt 140 lb (63.5 kg)   SpO2 98%   BMI 24.03 kg/m    Physical Exam   General: Appearance:    Well developed, well nourished female in no acute distress  Eyes:    PERRL, conjunctiva/corneas clear, EOM's intact       Lungs:  Clear to auscultation bilaterally, respirations unlabored  Heart:    Normal heart rate. Normal rhythm. No murmurs, rubs, or gallops.    MS:   All extremities are intact.    Neurologic:   Awake, alert, oriented x 3. No apparent focal neurological defect.         Assessment & Plan     1. Primary hypertension Well controlled.  Continue current medications.    2. Situational stress Reduce citalopram (CELEXA) 20 MG tablet to take 1/2 tablet (10 mg total) by mouth daily.  Dispense: 30 tablet; Refill: 0  3. Insomnia, unspecified type Continue to need to take zolpidem (AMBIEN) 10 MG tablet; TAKE ONE TABLET BY MOUTH ONE  TIME DAILY AT BEDTIME  Dispense: 90 tablet; Refill: 1  4. Cerebrovascular disease Asymptomatic. Compliant with medication.  Continue aggressive risk factor modification.    Future Appointments  Date Time Provider Department Center  11/06/2022 10:50 AM Sandi Mealy, MD [facility] None  03/25/2023  9:00 AM Malva Limes, MD [facility]  06/12/2023  9:20 AM Sandi Mealy, MD [facility] None            Mila Merry, MD  [facility] [phone] (phone) [phone] (fax)  [facility]

## 2022-11-04 NOTE — Patient Instructions (Signed)
.   Please review the attached list of medications and notify my office if there are any errors.   . Please bring all of your medications to every appointment so we can make sure that our medication list is the same as yours.   

## 2022-11-06 ENCOUNTER — Ambulatory Visit (INDEPENDENT_AMBULATORY_CARE_PROVIDER_SITE_OTHER): Payer: Medicare Other | Admitting: Dermatology

## 2022-11-06 VITALS — BP 126/66 | HR 66

## 2022-11-06 DIAGNOSIS — L910 Hypertrophic scar: Secondary | ICD-10-CM | POA: Diagnosis not present

## 2022-11-06 DIAGNOSIS — L578 Other skin changes due to chronic exposure to nonionizing radiation: Secondary | ICD-10-CM | POA: Diagnosis not present

## 2022-11-06 DIAGNOSIS — Z85828 Personal history of other malignant neoplasm of skin: Secondary | ICD-10-CM

## 2022-11-06 DIAGNOSIS — Z8589 Personal history of malignant neoplasm of other organs and systems: Secondary | ICD-10-CM

## 2022-11-06 NOTE — Patient Instructions (Signed)
Due to recent changes in healthcare laws, you may see results of your pathology and/or laboratory studies on MyChart before the doctors have had a chance to review them. We understand that in some cases there may be results that are confusing or concerning to you. Please understand that not all results are received at the same time and often the doctors may need to interpret multiple results in order to provide you with the best plan of care or course of treatment. Therefore, we ask that you please give us 2 business days to thoroughly review all your results before contacting the office for clarification. Should we see a critical lab result, you will be contacted sooner.   If You Need Anything After Your Visit  If you have any questions or concerns for your doctor, please call our main line at [phone] and press option 4 to reach your doctor's medical assistant. If no one answers, please leave a voicemail as directed and we will return your call as soon as possible. Messages left after 4 pm will be answered the following business day.   You may also send us a message via MyChart. We typically respond to MyChart messages within 1-2 business days.  For prescription refills, please ask your pharmacy to contact our office. Our fax number is [phone].  If you have an urgent issue when the clinic is closed that cannot wait until the next business day, you can page your doctor at the number below.    Please note that while we do our best to be available for urgent issues outside of office hours, we are not available 24/7.   If you have an urgent issue and are unable to reach us, you may choose to seek medical care at your doctor's office, retail clinic, urgent care center, or emergency room.  If you have a medical emergency, please immediately call 911 or go to the emergency department.  Pager Numbers  - Dr. Kowalski: [phone]  - Dr. Moye: [phone]  - Dr. Stewart:  [phone]  In the event of inclement weather, please call our main line at [phone] for an update on the status of any delays or closures.  Dermatology Medication Tips: Please keep the boxes that topical medications come in in order to help keep track of the instructions about where and how to use these. Pharmacies typically print the medication instructions only on the boxes and not directly on the medication tubes.   If your medication is too expensive, please contact our office at [phone] option 4 or send us a message through MyChart.   We are unable to tell what your co-pay for medications will be in advance as this is different depending on your insurance coverage. However, we may be able to find a substitute medication at lower cost or fill out paperwork to get insurance to cover a needed medication.   If a prior authorization is required to get your medication covered by your insurance company, please allow us 1-2 business days to complete this process.  Drug prices often vary depending on where the prescription is filled and some pharmacies may offer cheaper prices.  The website www.goodrx.com contains coupons for medications through different pharmacies. The prices here do not account for what the cost may be with help from insurance (it may be cheaper with your insurance), but the website can give you the price if you did not use any insurance.  - You can print the associated coupon and take it with   your prescription to the pharmacy.  - You may also stop by our office during regular business hours and pick up a GoodRx coupon card.  - If you need your prescription sent electronically to a different pharmacy, notify our office through [place] MyChart or by phone at [phone] option 4.     Si Usted Necesita Algo Despus de Su Visita  Tambin puede enviarnos un mensaje a travs de MyChart. Por lo general respondemos a los mensajes de MyChart en el transcurso de 1 a 2  das hbiles.  Para renovar recetas, por favor pida a su farmacia que se ponga en contacto con nuestra oficina. Nuestro nmero de fax es el [phone].  Si tiene un asunto urgente cuando la clnica est cerrada y que no puede esperar hasta el siguiente da hbil, puede llamar/localizar a su doctor(a) al nmero que aparece a continuacin.   Por favor, tenga en cuenta que aunque hacemos todo lo posible para estar disponibles para asuntos urgentes fuera del horario de oficina, no estamos disponibles las 24 horas del da, los 7 das de la semana.   Si tiene un problema urgente y no puede comunicarse con nosotros, puede optar por buscar atencin mdica  en el consultorio de su doctor(a), en una clnica privada, en un centro de atencin urgente o en una sala de emergencias.  Si tiene una emergencia mdica, por favor llame inmediatamente al 911 o vaya a la sala de emergencias.  Nmeros de bper  - Dr. Kowalski: [phone]  - Dra. Moye: [phone]  - Dra. Stewart: [phone]  En caso de inclemencias del tiempo, por favor llame a nuestra lnea principal al [phone] para una actualizacin sobre el estado de cualquier retraso o cierre.  Consejos para la medicacin en dermatologa: Por favor, guarde las cajas en las que vienen los medicamentos de uso tpico para ayudarle a seguir las instrucciones sobre dnde y cmo usarlos. Las farmacias generalmente imprimen las instrucciones del medicamento slo en las cajas y no directamente en los tubos del medicamento.   Si su medicamento es muy caro, por favor, pngase en contacto con nuestra oficina llamando al [phone] y presione la opcin 4 o envenos un mensaje a travs de MyChart.   No podemos decirle cul ser su copago por los medicamentos por adelantado ya que esto es diferente dependiendo de la cobertura de su seguro. Sin embargo, es posible que podamos encontrar un medicamento sustituto a menor costo o llenar un formulario para que el  seguro cubra el medicamento que se considera necesario.   Si se requiere una autorizacin previa para que su compaa de seguros cubra su medicamento, por favor permtanos de 1 a 2 das hbiles para completar este proceso.  Los precios de los medicamentos varan con frecuencia dependiendo del lugar de dnde se surte la receta y alguna farmacias pueden ofrecer precios ms baratos.  El sitio web www.goodrx.com tiene cupones para medicamentos de diferentes farmacias. Los precios aqu no tienen en cuenta lo que podra costar con la ayuda del seguro (puede ser ms barato con su seguro), pero el sitio web puede darle el precio si no utiliz ningn seguro.  - Puede imprimir el cupn correspondiente y llevarlo con su receta a la farmacia.  - Tambin puede pasar por nuestra oficina durante el horario de atencin regular y recoger una tarjeta de cupones de GoodRx.  - Si necesita que su receta se enve electrnicamente a una farmacia diferente, informe a nuestra oficina a travs de MyChart de [place]   o por telfono llamando al [phone] y presione la opcin 4.  

## 2022-11-06 NOTE — Progress Notes (Signed)
   Follow-Up Visit   Subjective  Meredith Russell is a 72 y.o. female who presents for the following: recheck SCC site of the mid chest right of midline, previously treated with ED&C.    The following portions of the chart were reviewed this encounter and updated as appropriate: medications, allergies, medical history  Review of Systems:  No other skin or systemic complaints except as noted in HPI or Assessment and Plan.  Objective  Well appearing patient in no apparent distress; mood and affect are within normal limits.   A focused examination was performed of the following areas: the face and chest   Relevant exam findings are noted in the Assessment and Plan.    Assessment & Plan   HYPERTROPHIC SCAR mid chest right of mid line of SCC, previously treated with ED&C   Exam: Dyspigmented papule or plaque without features suspicious for malignancy on dermoscopy  Benign-appearing.  Call clinic for new or changing lesions. Discussed option of intralesional triamcinolone if this becomes bothersome.   Treatment Plan: Observe. If any changes RTC.   HISTORY OF SQUAMOUS CELL CARCINOMA OF THE SKIN - No evidence of recurrence today - No lymphadenopathy - Recommend regular full body skin exams - Recommend daily broad spectrum sunscreen SPF 30+ to sun-exposed areas, reapply every 2 hours as needed.  - Call if any new or changing lesions are noted between office visits  ACTINIC DAMAGE - chronic, secondary to cumulative UV radiation exposure/sun exposure over time - diffuse scaly erythematous macules with underlying dyspigmentation - Recommend daily broad spectrum sunscreen SPF 30+ to sun-exposed areas, reapply every 2 hours as needed.  - Recommend staying in the shade or wearing long sleeves, sun glasses (UVA+UVB protection) and wide brim hats (4-inch brim around the entire circumference of the hat). - Call for new or changing lesions.  Return for appointment as scheduled.  [name], CMA, am acting as scribe for Darden Dates, MD .  Documentation: I have reviewed the above documentation for accuracy and completeness, and I agree with the above.  Darden Dates, MD

## 2022-12-12 ENCOUNTER — Other Ambulatory Visit: Payer: Self-pay | Admitting: Family Medicine

## 2022-12-12 DIAGNOSIS — F439 Reaction to severe stress, unspecified: Secondary | ICD-10-CM

## 2022-12-12 NOTE — Telephone Encounter (Signed)
Requested Prescriptions  Pending Prescriptions Disp Refills   citalopram (CELEXA) 20 MG tablet [Pharmacy Med Name: CITALOPRAM HBR 20 MG TABLET] 30 tablet 0    Sig: 1/2 TABLET DAILY FOR 6 DAYS THEN INCREASE TO 1 TABLET DAILY     Psychiatry:  Antidepressants - SSRI Passed - 12/12/2022 11:33 AM      Passed - Valid encounter within last 6 months    Recent Outpatient Visits           1 month ago Primary hypertension   [place] [facility] Malva Limes, MD   4 months ago Situational stress   [facility] Malva Limes, MD   8 months ago Situational stress   [facility] Malva Limes, MD   9 months ago Psoriatic arthritis [facility])   [place] [facility] Malva Limes, MD   1 year ago Migraine without aura and responsive to treatment   [facility] Malva Limes, MD       Future Appointments             In 3 months Fisher, Demetrios Isaacs, MD [facility]

## 2023-01-04 ENCOUNTER — Other Ambulatory Visit: Payer: Self-pay | Admitting: Family Medicine

## 2023-01-04 DIAGNOSIS — F439 Reaction to severe stress, unspecified: Secondary | ICD-10-CM

## 2023-02-03 ENCOUNTER — Other Ambulatory Visit: Payer: Self-pay | Admitting: Physical Medicine and Rehabilitation

## 2023-02-03 DIAGNOSIS — M5412 Radiculopathy, cervical region: Secondary | ICD-10-CM

## 2023-02-17 ENCOUNTER — Ambulatory Visit
Admission: RE | Admit: 2023-02-17 | Discharge: 2023-02-17 | Disposition: A | Discharge status: Home / Self Care | Payer: Medicare Other | Source: Ambulatory Visit | Attending: Physical Medicine and Rehabilitation | Admitting: Physical Medicine and Rehabilitation

## 2023-02-17 DIAGNOSIS — M5412 Radiculopathy, cervical region: Secondary | ICD-10-CM

## 2023-03-02 ENCOUNTER — Other Ambulatory Visit: Payer: Self-pay | Admitting: Family Medicine

## 2023-03-02 DIAGNOSIS — R110 Nausea: Secondary | ICD-10-CM

## 2023-03-04 NOTE — Telephone Encounter (Signed)
Requested medication (s) are due for refill today- no  Requested medication (s) are on the active medication list -no  Future visit scheduled -yes  Last refill: 12/11/21  Notes to clinic: non delegated Rx, no longer listed on current medication list  Requested Prescriptions  Pending Prescriptions Disp Refills   promethazine (PHENERGAN) 25 MG tablet [Pharmacy Med Name: PROMETHAZINE 25 MG TABLET] 20 tablet 5    Sig: TAKE 1 TABLET BY MOUTH EVERY 8 HOURS AS NEEDED FOR NAUSEA OR VOMITING.     Not Delegated - Gastroenterology: Antiemetics Failed - 03/02/2023  4:49 PM      Failed - This refill cannot be delegated      Passed - Valid encounter within last 6 months    Recent Outpatient Visits           4 months ago Primary hypertension   [facility] Malva Limes, MD   7 months ago Situational stress   [facility] Malva Limes, MD   11 months ago Situational stress   [facility] Malva Limes, MD   1 year ago Psoriatic arthritis [facility])   [facility] Malva Limes, MD   1 year ago Migraine without aura and responsive to treatment   [facility] Malva Limes, MD       Future Appointments             In 3 weeks Sherrie Mustache, Demetrios Isaacs, MD [facility]               Requested Prescriptions  Pending Prescriptions Disp Refills   promethazine (PHENERGAN) 25 MG tablet [Pharmacy Med Name: PROMETHAZINE 25 MG TABLET] 20 tablet 5    Sig: TAKE 1 TABLET BY MOUTH EVERY 8 HOURS AS NEEDED FOR NAUSEA OR VOMITING.     Not Delegated - Gastroenterology: Antiemetics Failed - 03/02/2023  4:49 PM      Failed - This refill cannot be delegated      Passed - Valid encounter within last 6 months    Recent Outpatient Visits           4 months ago Primary hypertension   [facility] Malva Limes,  MD   7 months ago Situational stress   [facility] Malva Limes, MD   11 months ago Situational stress   [facility] Malva Limes, MD   1 year ago Psoriatic arthritis [facility])    [facility] Malva Limes, MD   1 year ago Migraine without aura and responsive to treatment   [facility] Malva Limes, MD       Future Appointments             In 3 weeks Fisher, Demetrios Isaacs, MD [facility]

## 2023-03-06 ENCOUNTER — Other Ambulatory Visit: Payer: Self-pay | Admitting: Family Medicine

## 2023-03-06 DIAGNOSIS — J301 Allergic rhinitis due to pollen: Secondary | ICD-10-CM

## 2023-03-07 ENCOUNTER — Other Ambulatory Visit: Payer: Self-pay

## 2023-03-07 ENCOUNTER — Emergency Department: Payer: Medicare Other

## 2023-03-07 ENCOUNTER — Inpatient Hospital Stay
Admission: EM | Admit: 2023-03-07 | Discharge: 2023-03-12 | DRG: 322 | Disposition: A | Discharge status: Home / Self Care | Payer: Medicare Other | Attending: Internal Medicine | Admitting: Internal Medicine

## 2023-03-07 DIAGNOSIS — Z833 Family history of diabetes mellitus: Secondary | ICD-10-CM

## 2023-03-07 DIAGNOSIS — Z8249 Family history of ischemic heart disease and other diseases of the circulatory system: Secondary | ICD-10-CM

## 2023-03-07 DIAGNOSIS — R001 Bradycardia, unspecified: Secondary | ICD-10-CM | POA: Diagnosis present

## 2023-03-07 DIAGNOSIS — I2089 Other forms of angina pectoris: Principal | ICD-10-CM

## 2023-03-07 DIAGNOSIS — Z96611 Presence of right artificial shoulder joint: Secondary | ICD-10-CM | POA: Diagnosis present

## 2023-03-07 DIAGNOSIS — K649 Unspecified hemorrhoids: Secondary | ICD-10-CM | POA: Diagnosis present

## 2023-03-07 DIAGNOSIS — Z66 Do not resuscitate: Secondary | ICD-10-CM | POA: Diagnosis present

## 2023-03-07 DIAGNOSIS — M069 Rheumatoid arthritis, unspecified: Secondary | ICD-10-CM

## 2023-03-07 DIAGNOSIS — K219 Gastro-esophageal reflux disease without esophagitis: Secondary | ICD-10-CM | POA: Diagnosis present

## 2023-03-07 DIAGNOSIS — Z8701 Personal history of pneumonia (recurrent): Secondary | ICD-10-CM

## 2023-03-07 DIAGNOSIS — I7300 Raynaud's syndrome without gangrene: Secondary | ICD-10-CM | POA: Diagnosis present

## 2023-03-07 DIAGNOSIS — Z882 Allergy status to sulfonamides status: Secondary | ICD-10-CM

## 2023-03-07 DIAGNOSIS — F32A Depression, unspecified: Secondary | ICD-10-CM | POA: Insufficient documentation

## 2023-03-07 DIAGNOSIS — E782 Mixed hyperlipidemia: Secondary | ICD-10-CM

## 2023-03-07 DIAGNOSIS — R002 Palpitations: Secondary | ICD-10-CM | POA: Diagnosis present

## 2023-03-07 DIAGNOSIS — Z634 Disappearance and death of family member: Secondary | ICD-10-CM

## 2023-03-07 DIAGNOSIS — Z90710 Acquired absence of both cervix and uterus: Secondary | ICD-10-CM

## 2023-03-07 DIAGNOSIS — R6884 Jaw pain: Secondary | ICD-10-CM | POA: Diagnosis present

## 2023-03-07 DIAGNOSIS — Z79899 Other long term (current) drug therapy: Secondary | ICD-10-CM

## 2023-03-07 DIAGNOSIS — Z885 Allergy status to narcotic agent status: Secondary | ICD-10-CM

## 2023-03-07 DIAGNOSIS — I10 Essential (primary) hypertension: Secondary | ICD-10-CM

## 2023-03-07 DIAGNOSIS — Z87891 Personal history of nicotine dependence: Secondary | ICD-10-CM

## 2023-03-07 DIAGNOSIS — I214 Non-ST elevation (NSTEMI) myocardial infarction: Secondary | ICD-10-CM | POA: Diagnosis not present

## 2023-03-07 DIAGNOSIS — G43909 Migraine, unspecified, not intractable, without status migrainosus: Secondary | ICD-10-CM | POA: Diagnosis present

## 2023-03-07 DIAGNOSIS — I200 Unstable angina: Secondary | ICD-10-CM | POA: Diagnosis not present

## 2023-03-07 DIAGNOSIS — R079 Chest pain, unspecified: Secondary | ICD-10-CM | POA: Diagnosis present

## 2023-03-07 DIAGNOSIS — L4050 Arthropathic psoriasis, unspecified: Secondary | ICD-10-CM | POA: Diagnosis present

## 2023-03-07 DIAGNOSIS — I2510 Atherosclerotic heart disease of native coronary artery without angina pectoris: Secondary | ICD-10-CM | POA: Diagnosis present

## 2023-03-07 DIAGNOSIS — Z96653 Presence of artificial knee joint, bilateral: Secondary | ICD-10-CM | POA: Diagnosis present

## 2023-03-07 DIAGNOSIS — Z8616 Personal history of COVID-19: Secondary | ICD-10-CM

## 2023-03-07 DIAGNOSIS — R059 Cough, unspecified: Secondary | ICD-10-CM | POA: Diagnosis present

## 2023-03-07 DIAGNOSIS — Z888 Allergy status to other drugs, medicaments and biological substances status: Secondary | ICD-10-CM

## 2023-03-07 LAB — BASIC METABOLIC PANEL
Anion gap: 9 (ref 5–15)
BUN: 18 mg/dL (ref 8–23)
CO2: 26 mmol/L (ref 22–32)
Calcium: 9.2 mg/dL (ref 8.9–10.3)
Chloride: 102 mmol/L (ref 98–111)
Creatinine, Ser: 0.91 mg/dL (ref 0.44–1.00)
GFR, Estimated: >60 mL/min (ref >60)
Glucose, Bld: 146 mg/dL — ABNORMAL HIGH (ref 70–99)
Potassium: 4.3 mmol/L (ref 3.5–5.1)
Sodium: 137 mmol/L (ref 135–145)

## 2023-03-07 LAB — TROPONIN I (HIGH SENSITIVITY)
Troponin I (High Sensitivity): 46 ng/L — ABNORMAL HIGH (ref <18)
Troponin I (High Sensitivity): 46 ng/L — ABNORMAL HIGH (ref <18)
Troponin I (High Sensitivity): 36 ng/L — ABNORMAL HIGH (ref <18)

## 2023-03-07 LAB — CBC
HCT: 38.9 % (ref 36.0–46.0)
Hemoglobin: 12.5 g/dL (ref 12.0–15.0)
MCH: 32.1 pg (ref 26.0–34.0)
MCHC: 32.1 g/dL (ref 30.0–36.0)
MCV: 99.7 fL (ref 80.0–100.0)
Platelets: 488 10*3/uL — ABNORMAL HIGH (ref 150–400)
RBC: 3.9 MIL/uL (ref 3.87–5.11)
RDW: 12.7 % (ref 11.5–15.5)
WBC: 11 10*3/uL — ABNORMAL HIGH (ref 4.0–10.5)
nRBC: 0 % (ref 0.0–0.2)

## 2023-03-07 MED ORDER — PHENYLEPHRINE-MINERAL OIL-PET 0.25-14-74.9 % RE OINT: 1.0000 | TOPICAL_OINTMENT | Freq: Two times a day (BID) | RECTAL | Status: DC | PRN

## 2023-03-07 MED ORDER — AMLODIPINE BESYLATE 5 MG PO TABS: 5.0000 mg | ORAL_TABLET | Freq: Every day | ORAL | Status: DC

## 2023-03-07 MED ORDER — LORAZEPAM 1 MG PO TABS: 1.0000 mg | ORAL_TABLET | Freq: Four times a day (QID) | ORAL | Status: DC | PRN

## 2023-03-07 MED ORDER — FLUTICASONE PROPIONATE 50 MCG/ACT NA SUSP
2.0000 | Freq: Every day | NASAL | Status: DC
  Administered 2023-03-08 – 2023-03-12 (×5): 2 via NASAL
  Filled 2023-03-07: qty 16

## 2023-03-07 MED ORDER — CITALOPRAM HYDROBROMIDE 20 MG PO TABS
20.0000 mg | ORAL_TABLET | Freq: Every day | ORAL | Status: DC
  Administered 2023-03-08 – 2023-03-11 (×2): 20 mg via ORAL
  Filled 2023-03-07 (×5): qty 1

## 2023-03-07 MED ORDER — VITAMIN C 500 MG PO TABS
500.0000 mg | ORAL_TABLET | Freq: Every day | ORAL | Status: DC
  Administered 2023-03-08 – 2023-03-12 (×5): 500 mg via ORAL
  Filled 2023-03-07 (×5): qty 1

## 2023-03-07 MED ORDER — OXYCODONE-ACETAMINOPHEN 7.5-325 MG PO TABS: 1.0000 | ORAL_TABLET | Freq: Four times a day (QID) | ORAL | Status: DC | PRN

## 2023-03-07 MED ORDER — VITAMIN B-12 1000 MCG PO TABS
1000.0000 ug | ORAL_TABLET | Freq: Every day | ORAL | Status: DC
  Administered 2023-03-08 – 2023-03-12 (×5): 1000 ug via ORAL
  Filled 2023-03-07 (×5): qty 1

## 2023-03-07 MED ORDER — TRAZODONE HCL 50 MG PO TABS: 25.0000 mg | ORAL_TABLET | Freq: Every evening | ORAL | Status: DC | PRN

## 2023-03-07 MED ORDER — AMLODIPINE BESYLATE 5 MG PO TABS
5.0000 mg | ORAL_TABLET | Freq: Every day | ORAL | Status: DC
  Administered 2023-03-08 – 2023-03-10 (×3): 5 mg via ORAL
  Filled 2023-03-07 (×4): qty 1

## 2023-03-07 MED ORDER — CALCIPOTRIENE 0.005 % EX CREA: 1.0000 | TOPICAL_CREAM | Freq: Three times a day (TID) | CUTANEOUS | Status: DC | PRN

## 2023-03-07 MED ORDER — LORATADINE 10 MG PO TABS
10.0000 mg | ORAL_TABLET | Freq: Every day | ORAL | Status: DC
  Administered 2023-03-08 – 2023-03-12 (×5): 10 mg via ORAL
  Filled 2023-03-07 (×5): qty 1

## 2023-03-07 MED ORDER — HYDROCORTISONE ACE-PRAMOXINE 1-1 % RE CREA: 1.0000 | TOPICAL_CREAM | Freq: Two times a day (BID) | RECTAL | Status: DC | PRN

## 2023-03-07 MED ORDER — TIZANIDINE HCL 2 MG PO TABS: 2.0000 mg | ORAL_TABLET | Freq: Three times a day (TID) | ORAL | Status: DC | PRN

## 2023-03-07 MED ORDER — VITAMIN D 25 MCG (1000 UNIT) PO TABS
1000.0000 [IU] | ORAL_TABLET | Freq: Every day | ORAL | Status: DC
  Administered 2023-03-08 – 2023-03-12 (×5): 1000 [IU] via ORAL
  Filled 2023-03-07 (×5): qty 1

## 2023-03-07 MED ORDER — OXYCODONE-ACETAMINOPHEN 5-325 MG PO TABS
1.0000 | ORAL_TABLET | ORAL | Status: DC | PRN
  Administered 2023-03-07 – 2023-03-12 (×17): 2 via ORAL
  Filled 2023-03-07 (×17): qty 2

## 2023-03-07 MED ORDER — SODIUM CHLORIDE 0.9 % IV SOLN: INTRAVENOUS | Status: DC

## 2023-03-07 MED ORDER — HYDROMORPHONE HCL 1 MG/ML IJ SOLN: 1.0000 mg | INTRAMUSCULAR | Status: DC | PRN

## 2023-03-07 MED ORDER — NITROGLYCERIN 0.4 MG SL SUBL: 0.4000 mg | SUBLINGUAL_TABLET | SUBLINGUAL | Status: DC | PRN

## 2023-03-07 MED ORDER — MAGNESIUM HYDROXIDE 400 MG/5ML PO SUSP: 30.0000 mL | Freq: Every day | ORAL | Status: DC | PRN

## 2023-03-07 MED ORDER — MUPIROCIN 2 % EX OINT: 1.0000 | TOPICAL_OINTMENT | Freq: Every day | CUTANEOUS | Status: DC | PRN

## 2023-03-07 MED ORDER — ASPIRIN 81 MG PO CHEW
324.0000 mg | CHEWABLE_TABLET | Freq: Once | ORAL | Status: AC
  Administered 2023-03-07: 324 mg via ORAL
  Filled 2023-03-07: qty 4

## 2023-03-07 MED ORDER — ASPIRIN 81 MG PO TBEC
81.0000 mg | DELAYED_RELEASE_TABLET | Freq: Every day | ORAL | Status: DC
  Administered 2023-03-08 – 2023-03-12 (×4): 81 mg via ORAL
  Filled 2023-03-07 (×5): qty 1

## 2023-03-07 MED ORDER — FOLIC ACID 1 MG PO TABS
1.0000 mg | ORAL_TABLET | Freq: Every day | ORAL | Status: DC
  Administered 2023-03-11 – 2023-03-12 (×2): 1 mg via ORAL
  Filled 2023-03-07 (×5): qty 1

## 2023-03-07 MED ORDER — ZOLPIDEM TARTRATE 5 MG PO TABS
10.0000 mg | ORAL_TABLET | Freq: Every evening | ORAL | Status: DC | PRN
  Administered 2023-03-07 – 2023-03-11 (×5): 10 mg via ORAL
  Filled 2023-03-07 (×5): qty 2

## 2023-03-07 MED ORDER — TIZANIDINE HCL 2 MG PO TABS: 4.0000 mg | ORAL_TABLET | Freq: Three times a day (TID) | ORAL | Status: DC

## 2023-03-07 MED ORDER — ENOXAPARIN SODIUM 40 MG/0.4ML IJ SOSY
40.0000 mg | PREFILLED_SYRINGE | INTRAMUSCULAR | Status: DC
  Administered 2023-03-07 – 2023-03-10 (×4): 40 mg via SUBCUTANEOUS
  Filled 2023-03-07 (×4): qty 0.4

## 2023-03-07 MED ORDER — ONDANSETRON HCL 4 MG/2ML IJ SOLN: 4.0000 mg | Freq: Four times a day (QID) | INTRAMUSCULAR | Status: DC | PRN

## 2023-03-07 MED ORDER — ACETAMINOPHEN 325 MG PO TABS: 650.0000 mg | ORAL_TABLET | ORAL | Status: DC | PRN

## 2023-03-07 NOTE — ED Triage Notes (Signed)
PT reports CP that radiates into the neck/jaw and mild nausea for last 4 months- states it gets worse when walking. Pt reports history of RA, unsure of if pain is bc of that or other cause, [facility] sent pt to ED today for evaluation.

## 2023-03-07 NOTE — IPAL (Addendum)
  Interdisciplinary Goals of Care Family Meeting  Telephone meeting to clarify CODE STATUS requested by nurse [facility] as follows: "There is a confusion on this pt's code status. During the admission questions, pt stated that she agrees for chest compression but she "do not want a machine to keep her alive". This is verbalized by the pt. Pt's current code status is FULL but I read on the notes that Dr. Arville Care put her as DNR and DNI. She came in for chest pain, medical history significant for rheumatoid arthritis. Pt denies chest pain and latest vitals are stable."  Discussion: We discussed goals of care for Meredith Russell .   I have reviewed the H&P done earlier by Dr. Arville Care in which patient stated she was DNI and DNR and reviewed the orders which describes as being full code.  I spoke to patient at length.  She stated that she did not want to be intubated but she wanted chest compressions.  Stated that she lost her daughter and her daughter was DNR.  She also stated that her mother died from a massive heart attack at age 28 and they kept alive on the machine so that the family could see her alive on the return from the beach.  Her mother died as soon as they took off of the machine.  She is interested in chest compressions however orders would not allow isolated DNI order to be entered.  Patient stated both she and her husband would not want to be kept alive on life support.  I did explain that chest compressions by itself has a low chance of survival.  Given that isolated DNI is not allowed to be entered, she stated she would discuss it further with her husband tomorrow and have a more solid decision. Ultimately she decided to remain a full code pending further discussion with her husband.  Advised her that were something to happen tonight we would call her husband.  She is agreeable to remaining full code until further discussions .  Election for full code status pending further discussion with  family.   Date carried out: 03/07/2023  Location of the meeting: Phone conference  Member's involved: Physician and Bedside Registered Nurse  Durable Power of Attorney or acting medical decision maker: patient       Code status:   Code Status: Full Code pending discussion with husband on 03/08/23  Disposition: Continue current acute care  Time spent for the meeting: 30    Andris Baumann, MD  03/07/2023, 9:32 PM

## 2023-03-07 NOTE — Assessment & Plan Note (Signed)
>>  ASSESSMENT AND PLAN FOR ESSENTIAL HYPERTENSION WRITTEN ON 03/07/2023  5:51 PM BY MANSY, JAN A, MD  - We will continue amlodipine.

## 2023-03-07 NOTE — Consult Note (Signed)
Cardiology Consultation   Patient ID: Meredith Russell MRN: [id number]; DOB: [birth year]  Admit date: 03/07/2023 Date of Consult: 03/07/2023  PCP:  Malva Limes, MD   [facility] Providers Cardiologist:  None      New consult completed by Dr End  Patient Profile:   Meredith Russell is a 72 y.o. female with a hx of hypertension, rheumatoid arthritis, psoriatic arthritis, Raynaud's, migraines, GERD, who is being seen 03/07/2023 for the evaluation of chest pain at the request of Dr Lenard Lance.  History of Present Illness:   Meredith Russell is a 72 year old female with no prior cardiovascular history.  Longstanding history of rheumatoid arthritis and psoriatic arthritis with Raynaud's disease who is followed by [facility].  She is also a former smoker.  She does have a strong family history of early onset coronary artery disease. She presented to the [facility] emergency department after being evaluated in [facility] urgent care for complaint of chest pain that has been intermittent for the last 4 months.  She states that her chest pain is a heaviness and  tightness that is substernal and radiates up into her neck and jaw with exertion and then resolves with rest after 1 to 2 minutes.  She also has noted associated mild nausea.  With her rheumatoid arthritis and multiple medication changes that have been made she was not sure whether her discomfort was cardiac or related to her arthritis.  She has noted occasional palpitation but denies any shortness of breath or dyspnea on exertion.  She also denies any peripheral edema.  Initial vital signs: Blood pressure 145/76, pulse of 65, respirations 18, 98% on room air  Pertinent labs: Sodium 137, potassium 4.3, glucose of 146, BUN of 18, serum creatinine 0.91, WBCs 11.7, hemoglobin 12.5, hematocrit 38.9, platelets of 488, high-sensitivity troponin 46 x 2  Imaging: Chest x-ray reveals hyperinflation with remote prior scattered interstitial opacities that could be  acute versus chronic.   Medications administered in the emergency department: Aspirin 324 mg  Past Medical History:  Diagnosis Date   Dysplastic nevus 06/11/2022   left posterior thigh, shave removal 08/06/2022 margins clear   Hemorrhoids    Pneumonia due to COVID-19 virus    Psoriasis    Rheumatoid arthritis (HCC)    SCC (squamous cell carcinoma) 10/08/2022   mid chest right of mid line, [facility]    Past Surgical History:  Procedure Laterality Date   ABDOMINAL HYSTERECTOMY     APPENDECTOMY     BACK SURGERY  2005, 2006   Lumbar and cervical fusions   BREAST EXCISIONAL BIOPSY Right 90s   neg   BUNIONECTOMY     CARPAL TUNNEL RELEASE Bilateral 09/2013   CATARACT EXTRACTION, BILATERAL     COLONOSCOPY     EYE SURGERY     HEMORRHOID SURGERY N/A 09/17/2018   Procedure: HEMORRHOIDECTOMY SKIN TAG REMOVAL;  Surgeon: Sung Amabile, DO;  Location: [facility] ORS;  Service: General;  Laterality: N/A;   HEMORROIDECTOMY     JOINT REPLACEMENT     OOPHORECTOMY     one ovary removed   REPLACEMENT TOTAL KNEE Bilateral 2009   TOTAL SHOULDER REPLACEMENT Right 2010     Home Medications:  Prior to Admission medications   Medication Sig Start Date End Date Taking? Authorizing Provider  amLODipine (NORVASC) 5 MG tablet TAKE 1 TABLET BY MOUTH  DAILY 03/23/22   Malva Limes, MD  butalbital-acetaminophen-caffeine (FIORICET) 50-325-40 MG tablet TAKE 1-2 TABLET BY MOUTH EVERY 6 (SIX) HOURS AS  NEEDED FOR HEADACHE. 12/11/21   Malva Limes, MD  calcipotriene (DOVONOX) 0.005 % cream Apply 1 application  topically 3 (three) times daily as needed (psoriasis). 08/05/18   [provider]  Cholecalciferol (VITAMIN D3) 1000 units CAPS Take 1,000 Units by mouth daily.     [provider]  citalopram (CELEXA) 20 MG tablet Take 1 tablet (20 mg total) by mouth daily. 01/06/23   Malva Limes, MD  diclofenac (VOLTAREN) 50 MG EC tablet Take 50 mg by mouth daily.     [provider]  diclofenac  Sodium (VOLTAREN) 1 % GEL Apply topically. 02/13/22   [provider]  fexofenadine (ALLEGRA) 180 MG tablet TAKE 1 TABLET BY MOUTH EVERY DAY 06/04/22   Malva Limes, MD  fluticasone [facility]) 50 MCG/ACT nasal spray USE 2 SPRAYS IN BOTH NOSTRILS  DAILY AS NEEDED FOR ALLERGIES 03/06/23   Malva Limes, MD  folic acid (FOLVITE) 1 MG tablet Take 1 mg by mouth daily. 02/04/22   [provider]  halobetasol (ULTRAVATE) 0.05 % ointment Apply 1 application topically daily as needed (psoriasis).  09/10/17   [provider]  LORazepam (ATIVAN) 1 MG tablet TAKE ONE-HALF TO ONE TABLET BY MOUTH TWICE DAILY AS NEEDED FOR ANXIETY 08/12/22   Malva Limes, MD  mupirocin ointment (BACTROBAN) 2 % Apply 1 Application topically daily. 08/06/22   [place], [place], MD  oxyCODONE-acetaminophen (PERCOCET) 7.5-325 MG per tablet Take 1 tablet by mouth every 6 (six) hours as needed for severe pain.  11/29/09   [provider]  pramoxine-hydrocortisone [facility]) 1-1 % rectal cream Place 1 application rectally 2 (two) times daily. Patient taking differently: Place 1 application  rectally 2 (two) times daily as needed for hemorrhoids. 04/06/18   Malva Limes, MD  tiZANidine (ZANAFLEX) 2 MG tablet Take 2 mg by mouth 3 (three) times daily.    [provider]  tiZANidine (ZANAFLEX) 4 MG tablet Take 4 mg by mouth 3 (three) times daily. 02/14/22   [provider]  triamcinolone cream (KENALOG) 0.5 % Apply 1 application topically 2 (two) times daily. As needed 09/08/17   [provider]  vitamin B-12 (CYANOCOBALAMIN) 1000 MCG tablet Take 1,000 mcg by mouth daily.     [provider]  vitamin C (ASCORBIC ACID) 500 MG tablet Take 500 mg by mouth daily.  11/15/08   [provider]  zolpidem (AMBIEN) 10 MG tablet TAKE ONE TABLET BY MOUTH ONE TIME DAILY AT BEDTIME 11/04/22   Malva Limes, MD    Inpatient Medications: Scheduled Meds:   Continuous  Infusions:  PRN Meds:   Allergies:    Allergies  Allergen Reactions   Adalimumab Itching and Rash    Psoriasis   Morphine Other (See Comments)    Tachycardia   Morphine And Codeine Other (See Comments)    Tachycardia   Pregabalin Other (See Comments)    Increased BP    Savella  [Milnacipran] Rash   Sulfa Antibiotics Nausea And Vomiting    Social History:   Social History   Socioeconomic History   Marital status: Married    Spouse name: Onalee Hua   Number of children: 2   Years of education: Not on file   Highest education level: High school graduate  Occupational History   Occupation: retired  Tobacco Use   Smoking status: Former    Current packs/day: 0.00    Types: Cigarettes    Quit date: 11/22/1987    Years since  quitting: 35.3   Smokeless tobacco: Never  Vaping Use   Vaping status: Never Used  Substance and Sexual Activity   Alcohol use: Yes    Comment: occasional; drinks vodka and whiskey   Drug use: No   Sexual activity: Not on file  Other Topics Concern   Not on file  Social History Narrative   Not on file   Social Determinants of Health   Financial Resource Strain: Patient Declined (11/01/2022)   Overall Financial Resource Strain (CARDIA)    Difficulty of Paying Living Expenses: Patient declined  Food Insecurity: Patient Declined (11/01/2022)   Hunger Vital Sign    Worried About Running Out of Food in the Last Year: Patient declined    Ran Out of Food in the Last Year: Patient declined  Transportation Needs: Patient Declined (11/01/2022)   PRAPARE - Administrator, Civil Service (Medical): Patient declined    Lack of Transportation (Non-Medical): Patient declined  Physical Activity: Inactive (03/04/2019)   Exercise Vital Sign    Days of Exercise per Week: 0 days    Minutes of Exercise per Session: 0 min  Stress: Stress Concern Present (03/04/2019)   [employer] of Occupational Health - Occupational Stress Questionnaire    Feeling of  Stress : To some extent  Social Connections: Unknown (03/04/2019)   Social Connection and Isolation Panel [NHANES]    Frequency of Communication with Friends and Family: Patient declined    Frequency of Social Gatherings with Friends and Family: Patient declined    Attends Religious Services: Patient declined    Database administrator or Organizations: Patient declined    Attends Banker Meetings: Patient declined    Marital Status: Patient declined  Intimate Partner Violence: Unknown (03/04/2019)   Humiliation, Afraid, Rape, and Kick questionnaire    Fear of Current or Ex-Partner: Patient declined    Emotionally Abused: Patient declined    Physically Abused: Patient declined    Sexually Abused: Patient declined    Family History:    Family History  Problem Relation Age of Onset   Heart disease Mother    Heart attack Mother    Heart disease Father    Cancer Father    Diabetes Other    Heart disease Other    Hyperlipidemia Other    Hypertension Other    Dementia Daughter    Bladder Cancer Neg Hx    Kidney cancer Neg Hx    Colon cancer Neg Hx    Breast cancer Neg Hx      ROS:  Please see the history of present illness.  Review of Systems  Constitutional:  Positive for diaphoresis.  Cardiovascular:  Positive for chest pain and palpitations.  Gastrointestinal:  Positive for nausea.  Neurological:  Positive for headaches.    All other ROS reviewed and negative.     Physical Exam/Data:   Vitals:   03/07/23 1246 03/07/23 1504 03/07/23 1550  BP: (!) 145/76 (!) 141/81   Pulse: 65 (!) 57   Resp: 18 18   Temp: 98.6 F (37 C) 97.7 F (36.5 C)   TempSrc: Oral Oral   SpO2: 98% 97%   Weight:   63.5 kg  Height:   5\' 4"  (1.626 m)   No intake or output data in the 24 hours ending 03/07/23 1655    03/07/2023    3:50 PM 11/04/2022    2:22 PM 07/22/2022   10:01 AM  Last 3 Weights  Weight (lbs) 139 lb  15.9 oz 140 lb 134 lb 12.8 oz  Weight (kg) 63.5 kg 63.504 kg  61.145 kg     Body mass index is 24.03 kg/m.  General:  Well nourished, well developed, in no acute distress HEENT: normal Neck: no JVD Vascular: No carotid bruits; Distal pulses 2+ bilaterally Cardiac:  normal S1, S2; RRR; no murmur  Lungs:  clear to auscultation bilaterally, no wheezing, rhonchi or rales  Abd: soft, nontender, no hepatomegaly  Ext: no edema Musculoskeletal:  No deformities, BUE and BLE strength normal and equal Skin: warm and dry  Neuro:  CNs 2-12 intact, no focal abnormalities noted Psych:  Normal affect   EKG:  The EKG was personally reviewed and demonstrates: Sinus rhythm at a rate of 65 Telemetry:  Telemetry was personally reviewed and demonstrates: Sinus with rates of 60-70  Relevant CV Studies: Echocardiogram ordered and pending  Laboratory Data:  High Sensitivity Troponin:   Recent Labs  Lab 03/07/23 1248 03/07/23 1512  TROPONINIHS 46* 46*     Chemistry Recent Labs  Lab 03/07/23 1248  NA 137  K 4.3  CL 102  CO2 26  GLUCOSE 146*  BUN 18  CREATININE 0.91  CALCIUM 9.2  GFRNONAA >60  ANIONGAP 9    No results for input(s): "PROT", "ALBUMIN", "AST", "ALT", "ALKPHOS", "BILITOT" in the last 168 hours. Lipids No results for input(s): "CHOL", "TRIG", "HDL", "LABVLDL", "LDLCALC", "CHOLHDL" in the last 168 hours.  Hematology Recent Labs  Lab 03/07/23 1248  WBC 11.0*  RBC 3.90  HGB 12.5  HCT 38.9  MCV 99.7  MCH 32.1  MCHC 32.1  RDW 12.7  PLT 488*   Thyroid No results for input(s): "TSH", "FREET4" in the last 168 hours.  BNPNo results for input(s): "BNP", "PROBNP" in the last 168 hours.  DDimer No results for input(s): "DDIMER" in the last 168 hours.   Radiology/Studies:  DG Chest 2 View  Result Date: 03/07/2023 CLINICAL DATA:  Chest pain EXAM: CHEST - 2 VIEW COMPARISON:  X-ray 09/05/2008 FINDINGS: Hyperinflation. No consolidation, pneumothorax or effusion. No edema. There is some new patchy interstitial opacities in both lungs. Acute  versus chronic. Normal cardiopericardial silhouette. Calcified aorta. Curvature of the spine. Degenerative changes identified. Fixation hardware along the lower cervical spine. Right shoulder arthroplasty. IMPRESSION: Hyperinflation. New from the remote prior are some scattered interstitial opacities. This could be acute versus chronic. Please correlate with any more recent studies are follow up evaluation when appropriate Electronically Signed   By: Karen Kays M.D.   On: 03/07/2023 13:24     Assessment and Plan:   Chest pain concerning for unstable angina -presented with stuttering chest pain on exertion over the last four months with radiation into the neck and jaw -several risk factors for CAD with early onset family history, rheumatoid arthritis, and psoriatic arthritis -currently chest pain free at rest -EKG reveals sinus without ischemic changes -High-sensitivity troponin trending, onset 46 x 2 -Given 324 mg of aspirin in the emergency department -Started on aspirin 81 mg daily -lipid panel and hgb A1c for am -Echocardiogram ordered and pending with further recommendations to follow neurologic structural abnormalities -Scheduled for cardiac catheterization Tuesday 03/11/2023 -no current need to heparinize unless major upswing in troponins -continue on telemetry monitoring  Hypertension -Blood pressure 145/76 -Restarted amlodipine 5 mg daily -vital signs per unit protocol  3.   Abnormal chest x-ray -Patient with previous history of COVID infection and pneumonia -Positive for night sweats and productive cough -Recommend follow-up CT of the  chest  4.   Rheumatoid arthritis -multiple medication intolerances -continue home medications -continued management by IM    Risk Assessment/Risk Scores:     TIMI Risk Score for Unstable Angina or Non-ST Elevation MI:   The patient's TIMI risk score is 5, which indicates a 26% risk of all cause mortality, new or recurrent myocardial  infarction or need for urgent revascularization in the next 14 days.   Informed Consent   Shared Decision Making/Informed Consent The risks [stroke (1 in 1000), death (1 in 1000), kidney failure [usually temporary] (1 in 500), bleeding (1 in 200), allergic reaction [possibly serious] (1 in 200)], benefits (diagnostic support and management of coronary artery disease) and alternatives of a cardiac catheterization were discussed in detail with Meredith Russell and she is willing to proceed.     For questions or updates, please contact [facility] Please consult [email or web address] for contact info under    Signed, Leesa Leifheit, NP  03/07/2023 4:55 PM

## 2023-03-07 NOTE — ED Provider Notes (Signed)
[facility] Provider Note    Event Date/Time   First MD Initiated Contact with Patient 03/07/23 1526     (approximate)  History   Chief Complaint: Chest Pain  HPI  Meredith Russell is a 72 y.o. female with a past medical history of rheumatoid arthritis, presents to the emergency department for chest pain and left jaw pain.  According to the patient over the past 4 months she has had progressively worsening pain in the chest and jaw but only with exertion.  Patient states she has had progressively shorter distances that have caused the pain.  She now states she can only walk a few 100 yards before she develops pain in the chest and in the left jaw.  States as long as she sits down and rest the pain alleviates within 1 to 2 minutes.  Patient spoke to her doctor and they referred her to the emergency department for further evaluation.  Patient denies any shortness of breath nausea or diaphoresis.   Physical Exam   Triage Vital Signs: ED Triage Vitals  Encounter Vitals Group     BP 03/07/23 1246 (!) 145/76     Systolic BP Percentile --      Diastolic BP Percentile --      Pulse Rate 03/07/23 1246 65     Resp 03/07/23 1246 18     Temp 03/07/23 1246 98.6 F (37 C)     Temp Source 03/07/23 1246 Oral     SpO2 03/07/23 1246 98 %     Weight 03/07/23 1550 139 lb 15.9 oz (63.5 kg)     Height 03/07/23 1550 5\' 4"  (1.626 m)     Head Circumference --      Peak Flow --      Pain Score 03/07/23 1246 5     Pain Loc --      Pain Education --      Exclude from Growth Chart --     Most recent vital signs: Vitals:   03/07/23 1246 03/07/23 1504  BP: (!) 145/76 (!) 141/81  Pulse: 65 (!) 57  Resp: 18 18  Temp: 98.6 F (37 C) 97.7 F (36.5 C)  SpO2: 98% 97%    General: Awake, no distress.  CV:  Good peripheral perfusion.  Regular rate and rhythm  Resp:  Normal effort.  Equal breath sounds bilaterally.  Abd:  No distention.  Soft, nontender.  No rebound or  guarding. Other:  Lower extremity edema or tenderness   ED Results / Procedures / Treatments   EKG  EKG viewed and interpreted by myself shows a normal sinus rhythm at 65 bpm with a narrow QRS, normal axis, normal intervals, nonspecific but no concerning ST changes.  RADIOLOGY  I have reviewed and interpreted chest x-ray images.  No obvious consolidation on my evaluation. Radiology is read the chest x-ray is scattered opacities possibly chronic   MEDICATIONS ORDERED IN ED: Medications  aspirin chewable tablet 324 mg (has no administration in time range)     IMPRESSION / MDM / ASSESSMENT AND PLAN / ED COURSE  I reviewed the triage vital signs and the nursing notes.  Patient's presentation is most consistent with acute presentation with potential threat to life or bodily function.  Patient presents emergency department for chest pain and jaw pain that have been progressively worsening over the last 4 months with progressively shorter distances.  Symptoms are very suggestive of stable angina.  EKG is reassuring, chest x-ray is  clear.  Patient CBC is normal chemistry is normal however the patient's troponin is elevated at 46.  After 2 hours repeat troponin remains elevated at 46 but no change.  I spoke to cardiologist Dr. Cristal Deer End.  Believes this is a high risk chest pain patient and wife prefer to admit to the hospitalist service so that he can perform a stress test first thing in the morning.  Patient is agreeable to this plan as well.  FINAL CLINICAL IMPRESSION(S) / ED DIAGNOSES   CHest pain Stable angina  Note:  This document was prepared using Dragon voice recognition software and may include unintentional dictation errors.   Minna Antis, MD 03/07/23 [id number]

## 2023-03-07 NOTE — Progress Notes (Signed)
During the admission questions, pt stated that she agrees for chest compression but she "do not want a machine to keep her alive". This is verbalized by the pt. Current code status is FULL. As I read on the notes to verify, Dr. Arville Care put her as DNR and DNI. Dr. Para March made aware via secure chat. Pt currently denies chest pain and latest vitals are stable.

## 2023-03-07 NOTE — H&P (Signed)
[place]   PATIENT NAME: Meredith Russell    MR#:  [id number]  DATE OF BIRTH:  02-27-1951  DATE OF ADMISSION:  03/07/2023  PRIMARY CARE PHYSICIAN: Malva Limes, MD   Patient is coming from: Home  REQUESTING/REFERRING PHYSICIAN: Minna Antis, MD  CHIEF COMPLAINT:   Chief Complaint  Patient presents with   Chest Pain    HISTORY OF PRESENT ILLNESS:  YENI RENTON is a 72 y.o. Caucasian female with medical history significant for rheumatoid arthritis, who presented to emergency room with acute onset of intermittent chest pain over the last 4 months.  She describes a midsternal and extending from her neck down to her sternum with associated palpitations and mainly exertional and improving with rest for few minutes.  She admitted to being sometimes confused with her rheumatoid arthritis symptoms that involve her neck and sternum.  She denied any dyspnea or wheeze.  She admits to mild dry cough.  No fever or chills.  She denied any nausea or vomiting or pheresis today with her pain.  No bleeding diathesis.  No dysuria, liquidy or hematuria or flank pain.  ED Course: When the came to the ER vital signs revealed a BP of 145/76 with otherwise normal vital signs.  Labs revealed a blood glucose of 146 with otherwise normal BMP.  High sensitive troponin I was 46 and later the same.  CBC showed WBC of 11 and platelets 488 with otherwise normal H&H. EKG as reviewed by me : EKG showed normal sinus rhythm with a rate of 65 Imaging: Two-view chest x-ray revealed hyperinflation that is new from the remote prior with some scattered interstitial opacities the could be acute versus chronic.  The patient was given for review aspirin.  She will be admitted to an ops patient telemetry bed for further evaluation and management. PAST MEDICAL HISTORY:   Past Medical History:  Diagnosis Date   Dysplastic nevus 06/11/2022   left posterior thigh, shave removal 08/06/2022 margins clear    Hemorrhoids    Pneumonia due to COVID-19 virus    Psoriasis    Rheumatoid arthritis (HCC)    SCC (squamous cell carcinoma) 10/08/2022   mid chest right of mid line, [facility]    PAST SURGICAL HISTORY:   Past Surgical History:  Procedure Laterality Date   ABDOMINAL HYSTERECTOMY     APPENDECTOMY     BACK SURGERY  2005, 2006   Lumbar and cervical fusions   BREAST EXCISIONAL BIOPSY Right 90s   neg   BUNIONECTOMY     CARPAL TUNNEL RELEASE Bilateral 09/2013   CATARACT EXTRACTION, BILATERAL     COLONOSCOPY     EYE SURGERY     HEMORRHOID SURGERY N/A 09/17/2018   Procedure: HEMORRHOIDECTOMY SKIN TAG REMOVAL;  Surgeon: Sung Amabile, DO;  Location: [facility] ORS;  Service: General;  Laterality: N/A;   HEMORROIDECTOMY     JOINT REPLACEMENT     OOPHORECTOMY     one ovary removed   REPLACEMENT TOTAL KNEE Bilateral 2009   TOTAL SHOULDER REPLACEMENT Right 2010    SOCIAL HISTORY:   Social History   Tobacco Use   Smoking status: Former    Current packs/day: 0.00    Types: Cigarettes    Quit date: 11/22/1987    Years since quitting: 35.3   Smokeless tobacco: Never  Substance Use Topics   Alcohol use: Yes    Comment: occasional; drinks vodka and whiskey    FAMILY HISTORY:   Family History  Problem Relation Age of Onset   Heart disease Mother    Heart attack Mother    Heart disease Father    Cancer Father    Diabetes Other    Heart disease Other    Hyperlipidemia Other    Hypertension Other    Dementia Daughter    Bladder Cancer Neg Hx    Kidney cancer Neg Hx    Colon cancer Neg Hx    Breast cancer Neg Hx     DRUG ALLERGIES:   Allergies  Allergen Reactions   Adalimumab Itching and Rash    Psoriasis   Morphine Other (See Comments)    Tachycardia   Morphine And Codeine Other (See Comments)    Tachycardia   Pregabalin Other (See Comments)    Increased BP    Savella  [Milnacipran] Rash   Sulfa Antibiotics Nausea And Vomiting    REVIEW OF SYSTEMS:   ROS As per history  of present illness. All pertinent systems were reviewed above. Constitutional, HEENT, cardiovascular, respiratory, GI, GU, musculoskeletal, neuro, psychiatric, endocrine, integumentary and hematologic systems were reviewed and are otherwise negative/unremarkable except for positive findings mentioned above in the HPI.   MEDICATIONS AT HOME:   Prior to Admission medications   Medication Sig Start Date End Date Taking? Authorizing Provider  amLODipine (NORVASC) 5 MG tablet TAKE 1 TABLET BY MOUTH  DAILY 03/23/22   Malva Limes, MD  butalbital-acetaminophen-caffeine (FIORICET) [phone] MG tablet TAKE 1-2 TABLET BY MOUTH EVERY 6 (SIX) HOURS AS NEEDED FOR HEADACHE. 12/11/21   Malva Limes, MD  calcipotriene (DOVONOX) 0.005 % cream Apply 1 application  topically 3 (three) times daily as needed (psoriasis). 08/05/18   [provider]  Cholecalciferol (VITAMIN D3) 1000 units CAPS Take 1,000 Units by mouth daily.     [provider]  citalopram (CELEXA) 20 MG tablet Take 1 tablet (20 mg total) by mouth daily. 01/06/23   Malva Limes, MD  diclofenac (VOLTAREN) 50 MG EC tablet Take 50 mg by mouth daily.     [provider]  diclofenac Sodium (VOLTAREN) 1 % GEL Apply topically. 02/13/22   [provider]  fexofenadine (ALLEGRA) 180 MG tablet TAKE 1 TABLET BY MOUTH EVERY DAY 06/04/22   Malva Limes, MD  fluticasone [facility]) 50 MCG/ACT nasal spray USE 2 SPRAYS IN BOTH NOSTRILS  DAILY AS NEEDED FOR ALLERGIES 03/06/23   Malva Limes, MD  folic acid (FOLVITE) 1 MG tablet Take 1 mg by mouth daily. 02/04/22   [provider]  halobetasol (ULTRAVATE) 0.05 % ointment Apply 1 application topically daily as needed (psoriasis).  09/10/17   [provider]  LORazepam (ATIVAN) 1 MG tablet TAKE ONE-HALF TO ONE TABLET BY MOUTH TWICE DAILY AS NEEDED FOR ANXIETY 08/12/22   Malva Limes, MD  mupirocin ointment (BACTROBAN) 2 % Apply 1 Application topically daily.  08/06/22   [place], [place], MD  oxyCODONE-acetaminophen (PERCOCET) 7.5-325 MG per tablet Take 1 tablet by mouth every 6 (six) hours as needed for severe pain.  11/29/09   [provider]  pramoxine-hydrocortisone [facility]) 1-1 % rectal cream Place 1 application rectally 2 (two) times daily. Patient taking differently: Place 1 application  rectally 2 (two) times daily as needed for hemorrhoids. 04/06/18   Malva Limes, MD  tiZANidine (ZANAFLEX) 2 MG tablet Take 2 mg by mouth 3 (three) times daily.    [provider]  tiZANidine (ZANAFLEX) 4 MG tablet Take 4 mg by mouth 3 (three)  times daily. 02/14/22   [provider]  triamcinolone cream (KENALOG) 0.5 % Apply 1 application topically 2 (two) times daily. As needed 09/08/17   [provider]  vitamin B-12 (CYANOCOBALAMIN) 1000 MCG tablet Take 1,000 mcg by mouth daily.     [provider]  vitamin C (ASCORBIC ACID) 500 MG tablet Take 500 mg by mouth daily.  11/15/08   [provider]  zolpidem (AMBIEN) 10 MG tablet TAKE ONE TABLET BY MOUTH ONE TIME DAILY AT BEDTIME 11/04/22   Malva Limes, MD      VITAL SIGNS:  Blood pressure (!) 141/81, pulse (!) 57, temperature 97.7 F (36.5 C), temperature source Oral, resp. rate 18, height 5\' 4"  (1.626 m), weight 63.5 kg, SpO2 97%.  PHYSICAL EXAMINATION:  Physical Exam  GENERAL:  72 y.o.-year-old Caucasian female patient lying in the bed with no acute distress.  EYES: Pupils equal, round, reactive to light and accommodation. No scleral icterus. Extraocular muscles intact.  HEENT: Head atraumatic, normocephalic. Oropharynx and nasopharynx clear.  NECK:  Supple, no jugular venous distention. No thyroid enlargement, no tenderness.  LUNGS: Normal breath sounds bilaterally, no wheezing, rales,rhonchi or crepitation. No use of accessory muscles of respiration.  CARDIOVASCULAR: Regular rate and rhythm, S1, S2 normal. No murmurs, rubs, or gallops.  ABDOMEN:  Soft, nondistended, nontender. Bowel sounds present. No organomegaly or mass.  EXTREMITIES: No pedal edema, cyanosis, or clubbing.  NEUROLOGIC: Cranial nerves II through XII are intact. Muscle strength 5/5 in all extremities. Sensation intact. Gait not checked.  PSYCHIATRIC: The patient is alert and oriented x 3.  Normal affect and good eye contact. SKIN: No obvious rash, lesion, or ulcer.   LABORATORY PANEL:   CBC Recent Labs  Lab 03/07/23 1248  WBC 11.0*  HGB 12.5  HCT 38.9  PLT 488*   ------------------------------------------------------------------------------------------------------------------  Chemistries  Recent Labs  Lab 03/07/23 1248  NA 137  K 4.3  CL 102  CO2 26  GLUCOSE 146*  BUN 18  CREATININE 0.91  CALCIUM 9.2   ------------------------------------------------------------------------------------------------------------------  Cardiac Enzymes No results for input(s): "TROPONINI" in the last 168 hours. ------------------------------------------------------------------------------------------------------------------  RADIOLOGY:  DG Chest 2 View  Result Date: 03/07/2023 CLINICAL DATA:  Chest pain EXAM: CHEST - 2 VIEW COMPARISON:  X-ray 09/05/2008 FINDINGS: Hyperinflation. No consolidation, pneumothorax or effusion. No edema. There is some new patchy interstitial opacities in both lungs. Acute versus chronic. Normal cardiopericardial silhouette. Calcified aorta. Curvature of the spine. Degenerative changes identified. Fixation hardware along the lower cervical spine. Right shoulder arthroplasty. IMPRESSION: Hyperinflation. New from the remote prior are some scattered interstitial opacities. This could be acute versus chronic. Please correlate with any more recent studies are follow up evaluation when appropriate Electronically Signed   By: Karen Kays M.D.   On: 03/07/2023 13:24      IMPRESSION AND PLAN:  Assessment and Plan: * Chest pain - This is  concerning for unstable angina. - The patient will be admitted to an observation cardiac telemetry bed. - Will follow serial troponins and EKGs. - The patient will be placed on aspirin as well as p.r.n. sublingual nitroglycerin and Dilaudid sulfate for pain. - A cardiology consult was obtained for further cardiac risk stratification. - The patient was seen by Dr. Okey Dupre.  Essential hypertension - We will continue amlodipine.  Rheumatoid arthritis (HCC) - She is on azathioprine and leflunomide as well as methotrexate, prednisone and Rinvoq for it. - No current flare.  Depression - We will continue Lexapro.  DVT prophylaxis: Lovenox.  Advanced Care Planning:  Code Status: The patient is DNR and DNI. Family Communication:  The plan of care was discussed in details with the patient (and family). I answered all questions. The patient agreed to proceed with the above mentioned plan. Further management will depend upon hospital course. Disposition Plan: Back to previous home environment Consults called: Cardiology. All the records are reviewed and case discussed with ED provider.  Status is: Observation  I certify that at the time of admission, it is my clinical judgment that the patient will require hospital care extending less than 2 midnights.                            Dispo: The patient is from: Home              Anticipated d/c is to: Home              Patient currently is not medically stable to d/c.              Difficult to place patient: No  Hannah Beat M.D on 03/07/2023 at 5:53 PM  [facility]   From 7 PM-7 AM, contact night-coverage [email or web address]  CC: Primary care physician; Malva Limes, MD

## 2023-03-07 NOTE — ED Triage Notes (Signed)
AAOx3.  Skin warm and dry.  NAD 

## 2023-03-07 NOTE — ED Triage Notes (Signed)
Arrives from [facility] for ED evaluation.  C/O chest pain x 4 months intermittent.  Hx RA and psoriatic arthritis.

## 2023-03-07 NOTE — Assessment & Plan Note (Signed)
-   She is on azathioprine and leflunomide as well as methotrexate, prednisone and Rinvoq for it. - No current flare.

## 2023-03-07 NOTE — Assessment & Plan Note (Signed)
-   This is concerning for unstable angina. - The patient will be admitted to an observation cardiac telemetry bed. - Will follow serial troponins and EKGs. - The patient will be placed on aspirin as well as p.r.n. sublingual nitroglycerin and Dilaudid sulfate for pain. - A cardiology consult was obtained for further cardiac risk stratification. - The patient was seen by Dr. Okey Dupre.

## 2023-03-07 NOTE — Assessment & Plan Note (Signed)
-   We will continue amlodipine. 

## 2023-03-07 NOTE — Assessment & Plan Note (Signed)
-   We will continue Lexapro. 

## 2023-03-08 ENCOUNTER — Observation Stay (HOSPITAL_BASED_OUTPATIENT_CLINIC_OR_DEPARTMENT_OTHER)
Admit: 2023-03-08 | Discharge: 2023-03-08 | Disposition: A | Discharge status: Home / Self Care | Payer: Medicare Other | Attending: Cardiology | Admitting: Cardiology

## 2023-03-08 DIAGNOSIS — I200 Unstable angina: Secondary | ICD-10-CM | POA: Diagnosis not present

## 2023-03-08 DIAGNOSIS — R079 Chest pain, unspecified: Secondary | ICD-10-CM

## 2023-03-08 LAB — COMPREHENSIVE METABOLIC PANEL
ALT: 19 U/L (ref 0–44)
AST: 19 U/L (ref 15–41)
Albumin: 3.7 g/dL (ref 3.5–5.0)
Alkaline Phosphatase: 59 U/L (ref 38–126)
Anion gap: 6 (ref 5–15)
BUN: 16 mg/dL (ref 8–23)
CO2: 23 mmol/L (ref 22–32)
Calcium: 8.9 mg/dL (ref 8.9–10.3)
Chloride: 107 mmol/L (ref 98–111)
Creatinine, Ser: 0.75 mg/dL (ref 0.44–1.00)
GFR, Estimated: >60 mL/min (ref >60)
Glucose, Bld: 135 mg/dL — ABNORMAL HIGH (ref 70–99)
Potassium: 4.4 mmol/L (ref 3.5–5.1)
Sodium: 136 mmol/L (ref 135–145)
Total Bilirubin: 0.4 mg/dL (ref 0.3–1.2)
Total Protein: 6.7 g/dL (ref 6.5–8.1)

## 2023-03-08 LAB — ECHOCARDIOGRAM COMPLETE
AR max vel: 1.8 cm2
AV Peak grad: 9.4 mmHg
Ao pk vel: 1.53 m/s
Area-P 1/2: 2.92 cm2
Height: 64 in
S' Lateral: 3.1 cm
Weight: 2239.87 [oz_av]

## 2023-03-08 LAB — CBC
HCT: 37.2 % (ref 36.0–46.0)
Hemoglobin: 12 g/dL (ref 12.0–15.0)
MCH: 31.8 pg (ref 26.0–34.0)
MCHC: 32.3 g/dL (ref 30.0–36.0)
MCV: 98.7 fL (ref 80.0–100.0)
Platelets: 465 10*3/uL — ABNORMAL HIGH (ref 150–400)
RBC: 3.77 MIL/uL — ABNORMAL LOW (ref 3.87–5.11)
RDW: 12.5 % (ref 11.5–15.5)
WBC: 10.6 10*3/uL — ABNORMAL HIGH (ref 4.0–10.5)
nRBC: 0 % (ref 0.0–0.2)

## 2023-03-08 LAB — LIPID PANEL
Cholesterol: 190 mg/dL (ref 0–200)
HDL: 89 mg/dL (ref >40)
LDL Cholesterol: 89 mg/dL (ref 0–99)
Total CHOL/HDL Ratio: 2.1 ratio
Triglycerides: 62 mg/dL (ref <150)
VLDL: 12 mg/dL (ref 0–40)

## 2023-03-08 LAB — HEMOGLOBIN A1C
Hgb A1c MFr Bld: 6 % — ABNORMAL HIGH (ref 4.8–5.6)
Mean Plasma Glucose: 125.5 mg/dL

## 2023-03-08 MED ORDER — PREDNISONE 10 MG PO TABS
10.0000 mg | ORAL_TABLET | Freq: Every day | ORAL | Status: DC
  Administered 2023-03-11 – 2023-03-12 (×2): 10 mg via ORAL
  Filled 2023-03-08 (×2): qty 1

## 2023-03-08 MED ORDER — PREDNISONE 10 MG PO TABS
15.0000 mg | ORAL_TABLET | Freq: Every day | ORAL | Status: AC
  Administered 2023-03-08 – 2023-03-10 (×3): 15 mg via ORAL
  Filled 2023-03-08 (×3): qty 2

## 2023-03-08 MED ORDER — GUAIFENESIN-DM 100-10 MG/5ML PO SYRP
15.0000 mL | ORAL_SOLUTION | ORAL | Status: DC | PRN
  Administered 2023-03-08 – 2023-03-11 (×8): 15 mL via ORAL
  Filled 2023-03-08 (×8): qty 20

## 2023-03-08 MED ORDER — PREDNISONE 10 MG PO TABS: 5.0000 mg | ORAL_TABLET | Freq: Every day | ORAL | Status: DC

## 2023-03-08 MED ORDER — ROSUVASTATIN CALCIUM 10 MG PO TABS
10.0000 mg | ORAL_TABLET | Freq: Every day | ORAL | Status: DC
  Administered 2023-03-08 – 2023-03-12 (×5): 10 mg via ORAL
  Filled 2023-03-08 (×5): qty 1

## 2023-03-08 NOTE — Progress Notes (Addendum)
Pt wanted to let me know her reaction to Morphine. Pt verbalized, " Morphine makes my heart go crazy". Morphine is included in the pt's allergy list.

## 2023-03-08 NOTE — Progress Notes (Addendum)
   Patient Name: Meredith Russell Date of Encounter: 03/08/2023 [facility] Cardiologist: None   Interval Summary  .    +chest tightness that she attributes to RA and not receiving her prednisone yet  Vital Signs .    Vitals:   03/07/23 1929 03/08/23 0011 03/08/23 0419 03/08/23 0824  BP: 133/71 (!) 102/56 (!) 116/59 131/71  Pulse: (!) 59 (!) 58 (!) 59 62  Resp: 17 16 17 16   Temp: 98.7 F (37.1 C) 97.6 F (36.4 C) 98.1 F (36.7 C) 98.1 F (36.7 C)  TempSrc: Oral     SpO2: 100% 98% 100% 100%  Weight:      Height:       No intake or output data in the 24 hours ending 03/08/23 1223    03/07/2023    3:50 PM 11/04/2022    2:22 PM 07/22/2022   10:01 AM  Last 3 Weights  Weight (lbs) 139 lb 15.9 oz 140 lb 134 lb 12.8 oz  Weight (kg) 63.5 kg 63.504 kg 61.145 kg      Telemetry/ECG    Sinus rhythm.  No events. - Personally Reviewed  Physical Exam .   GEN: No acute distress.   Neck: No JVD Cardiac: RRR, no murmurs, rubs, or gallops.  Respiratory: Clear to auscultation bilaterally. GI: Soft, nontender, non-distended  MS: No edema  Assessment & Plan .     51F with RA, psoriatic arthritis, hypertension unstable angina.  # Unstable angina: # Hyperlipidemia:  Patient is having increasing chest pain recently.  High-sensitivity troponin elevated to 46-->46-->36.  She currently has chest discomfort and attributes to her RA.  Troponin downtrending.  Would not start IV heparin.  Planning for left heart cath on Tuesday.  Continue aspirin.  WIll add low dose statin.  LDL 85.  Would stop if no disease on cath.  # Hypertension:  Continue amlodipine.   For questions or updates, please contact [facility] Please consult [email or web address] for contact info under        Signed, Chilton Si, MD

## 2023-03-08 NOTE — Progress Notes (Signed)
Progress Note   Patient: Meredith Russell:[id number] DOB: 20-Feb-1951 DOA: 03/07/2023     0 DOS: the patient was seen and examined on 03/08/2023   Brief hospital course:  Meredith Russell is a 72 y.o. Caucasian female with medical history significant for rheumatoid arthritis, who presented to emergency room with acute onset of intermittent chest pain over the last 4 months.  She describes a midsternal and extending from her neck down to her sternum with associated palpitations and mainly exertional and improving with rest for few minutes.  She admitted to being sometimes confused with her rheumatoid arthritis symptoms that involve her neck and sternum.  She denied any dyspnea or wheeze.  She admits to mild dry cough.  No fever or chills.  She denied any nausea or vomiting or pheresis today with her pain.  No bleeding diathesis.  No dysuria, liquidy or hematuria or flank pain.   ED Course: When the came to the ER vital signs revealed a BP of 145/76 with otherwise normal vital signs.  Labs revealed a blood glucose of 146 with otherwise normal BMP.  High sensitive troponin I was 46 and later the same.  CBC showed WBC of 11 and platelets 488 with otherwise normal H&H. EKG as reviewed by me : EKG showed normal sinus rhythm with a rate of 65 Imaging: Two-view chest x-ray revealed hyperinflation that is new from the remote prior with some scattered interstitial opacities the could be acute versus chronic.   The patient was given four aspirin.  She will be admitted to an ops patient telemetry bed for further evaluation and management.    Assessment and Plan:  Unstable angina Denies having any further episodes of chest pain Troponin is down trending Appreciate cardiology input Continue aspirin and high intensity statin For left heart cath on Tuesday    Essential hypertension Continue amlodipine.   Rheumatoid arthritis (HCC) Continue prednisone and leflunomide - No current flare.    Depression - Continue escitalopram and trazodone     Subjective: Patient is seen and examined at the bedside. No new complaints  Physical Exam: Vitals:   03/08/23 0011 03/08/23 0419 03/08/23 0824 03/08/23 1300  BP: (!) 102/56 (!) 116/59 131/71 (!) 121/59  Pulse: (!) 58 (!) 59 62 63  Resp: 16 17 16    Temp: 97.6 F (36.4 C) 98.1 F (36.7 C) 98.1 F (36.7 C) 98.1 F (36.7 C)  TempSrc:    Oral  SpO2: 98% 100% 100% 96%  Weight:      Height:       GENERAL:  72 y.o.-year-old Caucasian female patient lying in the bed with no acute distress.  EYES: Pupils equal, round, reactive to light and accommodation. No scleral icterus. Extraocular muscles intact.  HEENT: Head atraumatic, normocephalic. Oropharynx and nasopharynx clear.  NECK:  Supple, no jugular venous distention. No thyroid enlargement, no tenderness.  LUNGS: Normal breath sounds bilaterally, no wheezing, rales,rhonchi or crepitation. No use of accessory muscles of respiration.  CARDIOVASCULAR: Regular rate and rhythm, S1, S2 normal. No murmurs, rubs, or gallops.  ABDOMEN: Soft, nondistended, nontender. Bowel sounds present. No organomegaly or mass.  EXTREMITIES: No pedal edema, cyanosis, or clubbing.  NEUROLOGIC: Cranial nerves II through XII are intact. Muscle strength 5/5 in all extremities. Sensation intact. Gait not checked.  PSYCHIATRIC: The patient is alert and oriented x 3.  Normal affect and good eye contact. SKIN: No obvious rash, lesion, or ulcer.       Data Reviewed: Labs reviewed.  Hemoglobin A1c 6.0, troponin 46 >> 36 There are no new results to review at this time.  Family Communication: Plan of care discussed with patient and her daughter at the bedside.  All questions and concerns have been addressed.  Disposition: Status is: Observation The patient remains OBS appropriate and will d/c before 2 midnights.  Planned Discharge Destination: Home    Time spent: 33 minutes  Author: Lucile Shutters,  MD 03/08/2023 3:13 PM  For on call review [email or web address].

## 2023-03-08 NOTE — Plan of Care (Signed)
  Problem: Activity: Goal: Ability to tolerate increased activity will improve Outcome: Progressing   Problem: Activity: Goal: Ability to return to baseline activity level will improve Outcome: Progressing   Problem: Cardiovascular: Goal: Ability to achieve and maintain adequate cardiovascular perfusion will improve Outcome: Progressing

## 2023-03-08 NOTE — Progress Notes (Signed)
  Echocardiogram 2D Echocardiogram has been performed.  Meredith Russell 03/08/2023, 10:27 AM

## 2023-03-09 DIAGNOSIS — I200 Unstable angina: Secondary | ICD-10-CM | POA: Diagnosis not present

## 2023-03-09 DIAGNOSIS — I10 Essential (primary) hypertension: Secondary | ICD-10-CM | POA: Diagnosis not present

## 2023-03-09 NOTE — Care Management Obs Status (Signed)
MEDICARE OBSERVATION STATUS NOTIFICATION   Patient Details  Name: Meredith Russell MRN: [id number] Date of Birth: Mar 26, 1951   Medicare Observation Status Notification Given:  Yes    Susa Simmonds, LCSWA 03/09/2023, 2:13 PM

## 2023-03-09 NOTE — Plan of Care (Signed)
  Problem: Education: Goal: Knowledge of General Education information will improve Description: Including pain rating scale, medication(s)/side effects and non-pharmacologic comfort measures Outcome: Progressing   Problem: Clinical Measurements: Goal: Will remain free from infection Outcome: Progressing   Problem: Clinical Measurements: Goal: Respiratory complications will improve Outcome: Progressing   Problem: Clinical Measurements: Goal: Cardiovascular complication will be avoided Outcome: Progressing   Problem: Activity: Goal: Risk for activity intolerance will decrease Outcome: Progressing   Problem: Pain Managment: Goal: General experience of comfort will improve Outcome: Progressing   

## 2023-03-09 NOTE — Plan of Care (Signed)

## 2023-03-09 NOTE — Progress Notes (Signed)
Progress Note   Patient: Meredith Russell ZOX:[id number] DOB: 11-02-1950 DOA: 03/07/2023     0 DOS: the patient was seen and examined on 03/09/2023   Brief hospital course:  Meredith Russell is a 72 y.o. Caucasian female with medical history significant for rheumatoid arthritis, who presented to emergency room with acute onset of intermittent chest pain over the last 4 months.  She describes a midsternal and extending from her neck down to her sternum with associated palpitations and mainly exertional and improving with rest for few minutes.  She admitted to being sometimes confused with her rheumatoid arthritis symptoms that involve her neck and sternum.  She denied any dyspnea or wheeze.  She admits to mild dry cough.  No fever or chills.  She denied any nausea or vomiting or pheresis today with her pain.  No bleeding diathesis.  No dysuria, liquidy or hematuria or flank pain.   ED Course: When the came to the ER vital signs revealed a BP of 145/76 with otherwise normal vital signs.  Labs revealed a blood glucose of 146 with otherwise normal BMP.  High sensitive troponin I was 46 and later the same.  CBC showed WBC of 11 and platelets 488 with otherwise normal H&H. EKG as reviewed by me : EKG showed normal sinus rhythm with a rate of 65 Imaging: Two-view chest x-ray revealed hyperinflation that is new from the remote prior with some scattered interstitial opacities the could be acute versus chronic.   The patient was given four aspirin.  She will be admitted to an ops patient telemetry bed for further evaluation and management.    Assessment and Plan:  Unstable angina Denies having any further episodes of chest pain. Complains of chest tightness Troponin is down trending Appreciate cardiology input. For left heart cath on Tuesday Continue aspirin and high intensity statin     Essential hypertension Continue amlodipine.    Rheumatoid arthritis (HCC) Continue prednisone and leflunomide No  current flare.    Depression Continue escitalopram and trazodone       Subjective: Patient is seen and examined at the bedside. Complains of chest tightness  Physical Exam: Vitals:   03/08/23 2100 03/09/23 0105 03/09/23 0522 03/09/23 0752  BP: (!) 146/74 125/82 133/80 (!) 145/84  Pulse: 67 61 (!) 59 (!) 55  Resp: 19 19 18 18   Temp: 98.4 F (36.9 C) 97.8 F (36.6 C) 98 F (36.7 C) 97.7 F (36.5 C)  TempSrc: Oral Oral Oral   SpO2: 98% 98% 98% 99%  Weight:      Height:        GENERAL:  72 y.o.-year-old Caucasian female patient lying in the bed with no acute distress.  EYES: Pupils equal, round, reactive to light and accommodation. No scleral icterus. Extraocular muscles intact.  HEENT: Head atraumatic, normocephalic. Oropharynx and nasopharynx clear.  NECK:  Supple, no jugular venous distention. No thyroid enlargement, no tenderness.  LUNGS: Normal breath sounds bilaterally, no wheezing, rales,rhonchi or crepitation. No use of accessory muscles of respiration.  CARDIOVASCULAR: Regular rate and rhythm, S1, S2 normal. No murmurs, rubs, or gallops.  ABDOMEN: Soft, nondistended, nontender. Bowel sounds present. No organomegaly or mass.  EXTREMITIES: No pedal edema, cyanosis, or clubbing.  NEUROLOGIC: Cranial nerves II through XII are intact. Muscle strength 5/5 in all extremities. Sensation intact. Gait not checked.  PSYCHIATRIC: The patient is alert and oriented x 3.  Normal affect and good eye contact. SKIN: No obvious rash, lesion, or ulcer.  Data Reviewed:  There are no new results to review at this time.  Family Communication: Plan of care was discussed with patient in detail  Disposition: Status is: Observation The patient remains OBS appropriate and will d/c before 2 midnights.  Planned Discharge Destination: Home    Time spent: 30 minutes  Author: Lucile Shutters, MD 03/09/2023 1:41 PM  For on call review [email or web address].

## 2023-03-09 NOTE — Progress Notes (Signed)
   Patient Name: Meredith Russell Date of Encounter: 03/09/2023 [facility] Cardiologist: None   Interval Summary  .    Feeling well.  No CP.  Gets short of breath and tired after walking 3 laps in the hospital.   Vital Signs .    Vitals:   03/08/23 2100 03/09/23 0105 03/09/23 0522 03/09/23 0752  BP: (!) 146/74 125/82 133/80 (!) 145/84  Pulse: 67 61 (!) 59 (!) 55  Resp: 19 19 18 18   Temp: 98.4 F (36.9 C) 97.8 F (36.6 C) 98 F (36.7 C) 97.7 F (36.5 C)  TempSrc: Oral Oral Oral   SpO2: 98% 98% 98% 99%  Weight:      Height:       No intake or output data in the 24 hours ending 03/09/23 1212    03/07/2023    3:50 PM 11/04/2022    2:22 PM 07/22/2022   10:01 AM  Last 3 Weights  Weight (lbs) 139 lb 15.9 oz 140 lb 134 lb 12.8 oz  Weight (kg) 63.5 kg 63.504 kg 61.145 kg      Telemetry/ECG    Sinus rhythm.  No events. - Personally Reviewed  Physical Exam .    VS:  BP (!) 145/84 (BP Location: Right Arm)   Pulse (!) 55   Temp 97.7 F (36.5 C)   Resp 18   Ht 5\' 4"  (1.626 m)   Wt 63.5 kg   SpO2 99%   BMI 24.03 kg/m  , BMI Body mass index is 24.03 kg/m. GENERAL:  Well appearing HEENT: Pupils equal round and reactive, fundi not visualized, oral mucosa unremarkable NECK:  No jugular venous distention, waveform within normal limits, carotid upstroke brisk and symmetric, no bruits, no thyromegaly LUNGS:  Clear to auscultation bilaterally HEART:  RRR.  PMI not displaced or sustained,S1 and S2 within normal limits, no S3, no S4, no clicks, no rubs, no murmurs ABD:  Flat, positive bowel sounds normal in frequency in pitch, no bruits, no rebound, no guarding, no midline pulsatile mass, no hepatomegaly, no splenomegaly EXT:  2 plus pulses throughout, no edema, no cyanosis no clubbing SKIN:  No rashes no nodules NEURO:  Cranial nerves II through XII grossly intact, motor grossly intact throughout PSYCH:  Cognitively intact, oriented to person place and time   Assessment  & Plan .     38F with RA, psoriatic arthritis, hypertension unstable angina.  # Unstable angina: # Hyperlipidemia:  Patient is having increasing chest pain recently.   She notes symptoms over the last 3 months.  High-sensitivity troponin elevated to 46-->46-->36.  She currently has chest discomfort and attributes to her RA.  She is currently chest pain-free.  Planning for left heart cath on Tuesday.  Continue aspirin.  LDL 85.  Low-dose statin was added this admission.  Would consider stopping if no disease on cath.  # Hypertension:  Continue amlodipine.   For questions or updates, please contact [facility] Please consult [email or web address] for contact info under        Signed, Chilton Si, MD

## 2023-03-09 NOTE — Plan of Care (Signed)
?  Problem: Education: ?Goal: Understanding of cardiac disease, CV risk reduction, and recovery process will improve ?Outcome: Progressing ?  ?Problem: Cardiac: ?Goal: Ability to achieve and maintain adequate cardiovascular perfusion will improve ?Outcome: Progressing ?  ?Problem: Health Behavior/Discharge Planning: ?Goal: Ability to safely manage health-related needs after discharge will improve ?Outcome: Progressing ?  ?

## 2023-03-09 NOTE — Care Management Important Message (Signed)
Important Message  Patient Details  Name: Meredith Russell MRN: [id number] Date of Birth: Nov 07, 1950   Medicare Important Message Given:  Yes     Susa Simmonds, LCSWA 03/09/2023, 2:13 PM

## 2023-03-10 DIAGNOSIS — M069 Rheumatoid arthritis, unspecified: Secondary | ICD-10-CM | POA: Diagnosis not present

## 2023-03-10 DIAGNOSIS — I10 Essential (primary) hypertension: Secondary | ICD-10-CM | POA: Diagnosis not present

## 2023-03-10 DIAGNOSIS — I200 Unstable angina: Secondary | ICD-10-CM | POA: Diagnosis not present

## 2023-03-10 MED ORDER — SODIUM CHLORIDE 0.9 % WEIGHT BASED INFUSION
3.0000 mL/kg/h | INTRAVENOUS | Status: DC
  Administered 2023-03-10: 3 mL/kg/h via INTRAVENOUS

## 2023-03-10 MED ORDER — ASPIRIN 81 MG PO CHEW
81.0000 mg | CHEWABLE_TABLET | ORAL | Status: AC
  Administered 2023-03-11: 81 mg via ORAL
  Filled 2023-03-10: qty 1

## 2023-03-10 MED ORDER — SODIUM CHLORIDE 0.9 % WEIGHT BASED INFUSION
1.0000 mL/kg/h | INTRAVENOUS | Status: DC
  Administered 2023-03-11 (×2): 1 mL/kg/h via INTRAVENOUS

## 2023-03-10 NOTE — Progress Notes (Signed)
   Patient Name: Meredith Russell Date of Encounter: 03/10/2023 [facility] Cardiologist: Yvonne Kendall, MD  Interval Summary  .    Meredith Russell is feeling well today.  She was able to walk 3 laps around the nursing station today without recurrent dyspnea or chest tightness.  She has noticed some jaw swelling discomfort, that she attributes to her RA.  Vital Signs .    Vitals:   03/09/23 1922 03/10/23 0045 03/10/23 0516 03/10/23 0839  BP: (!) 142/75 129/61 (!) 145/73 (!) 161/76  Pulse: 60 (!) 53 (!) 50 (!) 58  Resp: 17 12 20 16   Temp: 98.2 F (36.8 C) 97.8 F (36.6 C) 97.9 F (36.6 C) 97.9 F (36.6 C)  TempSrc:  Oral Oral Oral  SpO2: 97% 97% 100% 100%  Weight:      Height:        Intake/Output Summary (Last 24 hours) at 03/10/2023 1007 Last data filed at 03/10/2023 0400 Gross per 24 hour  Intake 1099.06 ml  Output --  Net 1099.06 ml      03/07/2023    3:50 PM 11/04/2022    2:22 PM 07/22/2022   10:01 AM  Last 3 Weights  Weight (lbs) 139 lb 15.9 oz 140 lb 134 lb 12.8 oz  Weight (kg) 63.5 kg 63.504 kg 61.145 kg      Telemetry/ECG    Normal sinus rhythm and sinus bradycardia.- Personally Reviewed  Physical Exam .   GEN: No acute distress.   Neck: No JVD Cardiac: RRR, no murmurs, rubs, or gallops.  Respiratory: Clear to auscultation bilaterally. GI: Soft, nontender, non-distended  MS: No edema  Assessment & Plan .     Unstable angina: Meredith Russell reports 3 to 4 months of progressive exertional pain in the upper chest, neck and jaw accompanied by dyspnea.  She even noted some symptoms walking around the nurses station over the weekend.  Troponin was minimally elevated and flat/downtrending on admission. -Proceed with left heart catheterization and possible PCI as planned tomorrow. -Continue aspirin and rosuvastatin. -Increase amlodipine to 10 mg daily for additional antianginal and blood pressure therapy.  Defer challenging with the beta-blocker in the setting  of resting bradycardia.  Hypertension: Blood pressure normal to moderately increased. -Increase amlodipine to 10 mg daily.  Rheumatoid and psoriatic arthritis: Meredith Russell concerned about some jaw discomfort and swelling that she attributes to her RA. -Continue prednisone and leflunomide per IM.  Informed Consent   Shared Decision Making/Informed Consent The risks [stroke (1 in 1000), death (1 in 1000), kidney failure [usually temporary] (1 in 500), bleeding (1 in 200), allergic reaction [possibly serious] (1 in 200)], benefits (diagnostic support and management of coronary artery disease) and alternatives of a cardiac catheterization were discussed in detail with Meredith Russell and she is willing to proceed.    For questions or updates, please contact [facility] Please consult [email or web address] for contact info under [facility].  Signed, Yvonne Kendall, MD

## 2023-03-10 NOTE — H&P (View-Only) (Signed)
   Patient Name: Meredith Russell Date of Encounter: 03/10/2023 [facility] Cardiologist: Yvonne Kendall, MD  Interval Summary  .    [name] is feeling well today.  She was able to walk 3 laps around the nursing station today without recurrent dyspnea or chest tightness.  She has noticed some jaw swelling discomfort, that she attributes to her RA.  Vital Signs .    Vitals:   03/09/23 1922 03/10/23 0045 03/10/23 0516 03/10/23 0839  BP: (!) 142/75 129/61 (!) 145/73 (!) 161/76  Pulse: 60 (!) 53 (!) 50 (!) 58  Resp: 17 12 20 16   Temp: 98.2 F (36.8 C) 97.8 F (36.6 C) 97.9 F (36.6 C) 97.9 F (36.6 C)  TempSrc:  Oral Oral Oral  SpO2: 97% 97% 100% 100%  Weight:      Height:        Intake/Output Summary (Last 24 hours) at 03/10/2023 1007 Last data filed at 03/10/2023 0400 Gross per 24 hour  Intake 1099.06 ml  Output --  Net 1099.06 ml      03/07/2023    3:50 PM 11/04/2022    2:22 PM 07/22/2022   10:01 AM  Last 3 Weights  Weight (lbs) 139 lb 15.9 oz 140 lb 134 lb 12.8 oz  Weight (kg) 63.5 kg 63.504 kg 61.145 kg      Telemetry/ECG    Normal sinus rhythm and sinus bradycardia.- Personally Reviewed  Physical Exam .   GEN: No acute distress.   Neck: No JVD Cardiac: RRR, no murmurs, rubs, or gallops.  Respiratory: Clear to auscultation bilaterally. GI: Soft, nontender, non-distended  MS: No edema  Assessment & Plan .     Unstable angina: [name] reports 3 to 4 months of progressive exertional pain in the upper chest, neck and jaw accompanied by dyspnea.  She even noted some symptoms walking around the nurses station over the weekend.  Troponin was minimally elevated and flat/downtrending on admission. -Proceed with left heart catheterization and possible PCI as planned tomorrow. -Continue aspirin and rosuvastatin. -Increase amlodipine to 10 mg daily for additional antianginal and blood pressure therapy.  Defer challenging with the beta-blocker in the setting  of resting bradycardia.  Hypertension: Blood pressure normal to moderately increased. -Increase amlodipine to 10 mg daily.  Rheumatoid and psoriatic arthritis: [name] concerned about some jaw discomfort and swelling that she attributes to her RA. -Continue prednisone and leflunomide per IM.  Informed Consent   Shared Decision Making/Informed Consent The risks [stroke (1 in 1000), death (1 in 1000), kidney failure [usually temporary] (1 in 500), bleeding (1 in 200), allergic reaction [possibly serious] (1 in 200)], benefits (diagnostic support and management of coronary artery disease) and alternatives of a cardiac catheterization were discussed in detail with [name] and she is willing to proceed.    For questions or updates, please contact [facility] Please consult [email or web address] for contact info under [facility].  Signed, Yvonne Kendall, MD

## 2023-03-10 NOTE — Progress Notes (Signed)
Progress Note   Patient: Meredith Russell CWC:[id number] DOB: Jan 12, 1951 DOA: 03/07/2023     0 DOS: the patient was seen and examined on 03/10/2023   Brief hospital course:  Meredith Russell is a 72 y.o. Caucasian female with medical history significant for rheumatoid arthritis, who presented to emergency room with acute onset of intermittent chest pain over the last 4 months.  She describes a midsternal and extending from her neck down to her sternum with associated palpitations and mainly exertional and improving with rest for few minutes.  She admitted to being sometimes confused with her rheumatoid arthritis symptoms that involve her neck and sternum.  She denied any dyspnea or wheeze.  She admits to mild dry cough.  No fever or chills.  She denied any nausea or vomiting or pheresis today with her pain.  No bleeding diathesis.  No dysuria, liquidy or hematuria or flank pain.   ED Course: When the came to the ER vital signs revealed a BP of 145/76 with otherwise normal vital signs.  Labs revealed a blood glucose of 146 with otherwise normal BMP.  High sensitive troponin I was 46 and later the same.  CBC showed WBC of 11 and platelets 488 with otherwise normal H&H. EKG as reviewed by me : EKG showed normal sinus rhythm with a rate of 65 Imaging: Two-view chest x-ray revealed hyperinflation that is new from the remote prior with some scattered interstitial opacities the could be acute versus chronic.   The patient was given four aspirin.  She will be admitted to an ops patient telemetry bed for further evaluation and management.       Assessment and Plan:  Unstable angina Denies having any further episodes of chest pain or chest tightness Able to ambulate in the hall without any chest pain Troponin is down trending Appreciate cardiology input. For left heart cath on Tuesday Continue aspirin and high intensity statin     Essential hypertension Continue amlodipine, increase dose to 10 mg to  optimize blood pressure control Not on beta-blockers due to resting bradycardia     Rheumatoid arthritis (HCC) Continue prednisone and leflunomide No current flare.     Depression Continue escitalopram and trazodone          Subjective: Patient is seen and examined at the bedside.  Very tearful this morning but denies having any pain.  Physical Exam: Vitals:   03/10/23 0516 03/10/23 0839 03/10/23 1137 03/10/23 1140  BP: (!) 145/73 (!) 161/76  136/73  Pulse: (!) 50 (!) 58 67 61  Resp: 20 16 16 16   Temp: 97.9 F (36.6 C) 97.9 F (36.6 C) 98.2 F (36.8 C) 98.4 F (36.9 C)  TempSrc: Oral Oral Oral Oral  SpO2: 100% 100% 97% 97%  Weight:      Height:       GENERAL:  72 y.o.-year-old Caucasian female patient lying in the bed with no acute distress.  EYES: Pupils equal, round, reactive to light and accommodation. No scleral icterus. Extraocular muscles intact.  HEENT: Head atraumatic, normocephalic. Oropharynx and nasopharynx clear.  NECK:  Supple, no jugular venous distention. No thyroid enlargement, no tenderness.  LUNGS: Normal breath sounds bilaterally, no wheezing, rales,rhonchi or crepitation. No use of accessory muscles of respiration.  CARDIOVASCULAR: Regular rate and rhythm, S1, S2 normal. No murmurs, rubs, or gallops.  ABDOMEN: Soft, nondistended, nontender. Bowel sounds present. No organomegaly or mass.  EXTREMITIES: No pedal edema, cyanosis, or clubbing.  NEUROLOGIC: Cranial nerves II through XII are  intact. Muscle strength 5/5 in all extremities. Sensation intact. Gait not checked.  PSYCHIATRIC: The patient is alert and oriented x 3.  Normal affect and good eye contact. SKIN: No obvious rash, lesion, or ulcer.     Data Reviewed:  There are no new results to review at this time.  Family Communication: Plan of care discussed with patient at the bedside.  She verbalizes understanding and agrees with the plan.  Disposition: Status is: Observation The patient  remains OBS appropriate and will d/c before 2 midnights.  Planned Discharge Destination: Home    Time spent: 32  minutes  Author: Lucile Shutters, MD 03/10/2023 2:00 PM  For on call review [email or web address].

## 2023-03-10 NOTE — Plan of Care (Signed)

## 2023-03-10 NOTE — TOC Progression Note (Signed)
Transition of Care [facility]) - Progression Note    Patient Details  Name: Meredith Russell MRN: [id number] Date of Birth: October 25, 1950  Transition of [facility] CM/SW Contact  Truddie Hidden, RN Phone Number: 03/10/2023, 3:07 PM  Clinical Narrative:    TOC continuing to follow patent's progress throughout discharge planning.        Expected Discharge Plan and Services                                               Social Determinants of Health (SDOH) Interventions SDOH Screenings   Food Insecurity: No Food Insecurity (03/07/2023)  Housing: Low Risk  (03/07/2023)  Transportation Needs: No Transportation Needs (03/07/2023)  Utilities: Not At Risk (03/07/2023)  Alcohol Screen: Low Risk  (07/22/2022)  Depression (PHQ2-9): Medium Risk (11/04/2022)  Financial Resource Strain: Patient Declined (11/01/2022)  Physical Activity: Inactive (03/04/2019)  Social Connections: Unknown (03/04/2019)  Stress: Stress Concern Present (03/04/2019)  Tobacco Use: Medium Risk (03/07/2023)    Readmission Risk Interventions     No data to display

## 2023-03-10 NOTE — Plan of Care (Signed)
  Problem: Education: Goal: Understanding of CV disease, CV risk reduction, and recovery process will improve Outcome: Progressing   Problem: Activity: Goal: Ability to return to baseline activity level will improve Outcome: Progressing   Problem: Education: Goal: Understanding of CV disease, CV risk reduction, and recovery process will improve Outcome: Progressing   Problem: Activity: Goal: Ability to return to baseline activity level will improve Outcome: Progressing   Problem: Education: Goal: Knowledge of General Education information will improve Description: Including pain rating scale, medication(s)/side effects and non-pharmacologic comfort measures Outcome: Progressing

## 2023-03-11 ENCOUNTER — Other Ambulatory Visit: Payer: Self-pay

## 2023-03-11 ENCOUNTER — Encounter
Admission: EM | Disposition: A | Discharge status: Home / Self Care | Payer: Self-pay | Source: Home / Self Care | Attending: Internal Medicine

## 2023-03-11 ENCOUNTER — Other Ambulatory Visit (HOSPITAL_COMMUNITY): Payer: Self-pay

## 2023-03-11 DIAGNOSIS — I2089 Other forms of angina pectoris: Secondary | ICD-10-CM | POA: Diagnosis not present

## 2023-03-11 DIAGNOSIS — R002 Palpitations: Secondary | ICD-10-CM | POA: Diagnosis present

## 2023-03-11 DIAGNOSIS — R6884 Jaw pain: Secondary | ICD-10-CM | POA: Diagnosis present

## 2023-03-11 DIAGNOSIS — Z888 Allergy status to other drugs, medicaments and biological substances status: Secondary | ICD-10-CM | POA: Diagnosis not present

## 2023-03-11 DIAGNOSIS — Z8701 Personal history of pneumonia (recurrent): Secondary | ICD-10-CM | POA: Diagnosis not present

## 2023-03-11 DIAGNOSIS — Z96653 Presence of artificial knee joint, bilateral: Secondary | ICD-10-CM | POA: Diagnosis present

## 2023-03-11 DIAGNOSIS — I7300 Raynaud's syndrome without gangrene: Secondary | ICD-10-CM | POA: Diagnosis present

## 2023-03-11 DIAGNOSIS — Z8616 Personal history of COVID-19: Secondary | ICD-10-CM | POA: Diagnosis not present

## 2023-03-11 DIAGNOSIS — I214 Non-ST elevation (NSTEMI) myocardial infarction: Secondary | ICD-10-CM | POA: Diagnosis present

## 2023-03-11 DIAGNOSIS — I25110 Atherosclerotic heart disease of native coronary artery with unstable angina pectoris: Secondary | ICD-10-CM

## 2023-03-11 DIAGNOSIS — F32A Depression, unspecified: Secondary | ICD-10-CM | POA: Diagnosis present

## 2023-03-11 DIAGNOSIS — Z79899 Other long term (current) drug therapy: Secondary | ICD-10-CM | POA: Diagnosis not present

## 2023-03-11 DIAGNOSIS — Z87891 Personal history of nicotine dependence: Secondary | ICD-10-CM | POA: Diagnosis not present

## 2023-03-11 DIAGNOSIS — Z8249 Family history of ischemic heart disease and other diseases of the circulatory system: Secondary | ICD-10-CM | POA: Diagnosis not present

## 2023-03-11 DIAGNOSIS — I10 Essential (primary) hypertension: Secondary | ICD-10-CM | POA: Diagnosis present

## 2023-03-11 DIAGNOSIS — I200 Unstable angina: Secondary | ICD-10-CM | POA: Diagnosis present

## 2023-03-11 DIAGNOSIS — Z885 Allergy status to narcotic agent status: Secondary | ICD-10-CM | POA: Diagnosis not present

## 2023-03-11 DIAGNOSIS — Z5181 Encounter for therapeutic drug level monitoring: Secondary | ICD-10-CM | POA: Diagnosis not present

## 2023-03-11 DIAGNOSIS — K649 Unspecified hemorrhoids: Secondary | ICD-10-CM | POA: Diagnosis present

## 2023-03-11 DIAGNOSIS — Z882 Allergy status to sulfonamides status: Secondary | ICD-10-CM | POA: Diagnosis not present

## 2023-03-11 DIAGNOSIS — M069 Rheumatoid arthritis, unspecified: Secondary | ICD-10-CM | POA: Diagnosis present

## 2023-03-11 DIAGNOSIS — K219 Gastro-esophageal reflux disease without esophagitis: Secondary | ICD-10-CM | POA: Diagnosis present

## 2023-03-11 DIAGNOSIS — I2510 Atherosclerotic heart disease of native coronary artery without angina pectoris: Secondary | ICD-10-CM | POA: Diagnosis present

## 2023-03-11 DIAGNOSIS — L4050 Arthropathic psoriasis, unspecified: Secondary | ICD-10-CM | POA: Diagnosis present

## 2023-03-11 DIAGNOSIS — E782 Mixed hyperlipidemia: Secondary | ICD-10-CM | POA: Diagnosis present

## 2023-03-11 DIAGNOSIS — Z66 Do not resuscitate: Secondary | ICD-10-CM | POA: Diagnosis present

## 2023-03-11 DIAGNOSIS — R079 Chest pain, unspecified: Secondary | ICD-10-CM | POA: Diagnosis not present

## 2023-03-11 DIAGNOSIS — R001 Bradycardia, unspecified: Secondary | ICD-10-CM | POA: Diagnosis present

## 2023-03-11 DIAGNOSIS — G43909 Migraine, unspecified, not intractable, without status migrainosus: Secondary | ICD-10-CM | POA: Diagnosis present

## 2023-03-11 HISTORY — PX: CORONARY STENT INTERVENTION: CATH118234

## 2023-03-11 HISTORY — PX: LEFT HEART CATH AND CORONARY ANGIOGRAPHY: CATH118249

## 2023-03-11 HISTORY — PX: CORONARY PRESSURE/FFR STUDY: CATH118243

## 2023-03-11 LAB — BASIC METABOLIC PANEL
Anion gap: 5 (ref 5–15)
BUN: 19 mg/dL (ref 8–23)
CO2: 28 mmol/L (ref 22–32)
Calcium: 8.3 mg/dL — ABNORMAL LOW (ref 8.9–10.3)
Chloride: 106 mmol/L (ref 98–111)
Creatinine, Ser: 0.91 mg/dL (ref 0.44–1.00)
GFR, Estimated: >60 mL/min (ref >60)
Glucose, Bld: 109 mg/dL — ABNORMAL HIGH (ref 70–99)
Potassium: 3.8 mmol/L (ref 3.5–5.1)
Sodium: 139 mmol/L (ref 135–145)

## 2023-03-11 LAB — TROPONIN I (HIGH SENSITIVITY)
Troponin I (High Sensitivity): 607 ng/L (ref <18)
Troponin I (High Sensitivity): 693 ng/L (ref <18)

## 2023-03-11 LAB — CBC
HCT: 35.8 % — ABNORMAL LOW (ref 36.0–46.0)
Hemoglobin: 11.6 g/dL — ABNORMAL LOW (ref 12.0–15.0)
MCH: 32.5 pg (ref 26.0–34.0)
MCHC: 32.4 g/dL (ref 30.0–36.0)
MCV: 100.3 fL — ABNORMAL HIGH (ref 80.0–100.0)
Platelets: 394 10*3/uL (ref 150–400)
RBC: 3.57 MIL/uL — ABNORMAL LOW (ref 3.87–5.11)
RDW: 12.6 % (ref 11.5–15.5)
WBC: 9.4 10*3/uL (ref 4.0–10.5)
nRBC: 0 % (ref 0.0–0.2)

## 2023-03-11 LAB — MAGNESIUM: Magnesium: 2.1 mg/dL (ref 1.7–2.4)

## 2023-03-11 LAB — POCT ACTIVATED CLOTTING TIME
Activated Clotting Time: 232 s
Activated Clotting Time: 262 s
Activated Clotting Time: 262 s
Activated Clotting Time: 323 s

## 2023-03-11 LAB — PROTIME-INR
INR: 1 (ref 0.8–1.2)
Prothrombin Time: 13.8 s (ref 11.4–15.2)

## 2023-03-11 SURGERY — LEFT HEART CATH AND CORONARY ANGIOGRAPHY
Anesthesia: Moderate Sedation

## 2023-03-11 MED ORDER — NITROGLYCERIN 1 MG/10 ML FOR IR/CATH LAB
INTRA_ARTERIAL | Status: AC
  Filled 2023-03-11: qty 10

## 2023-03-11 MED ORDER — HEPARIN SODIUM (PORCINE) 1000 UNIT/ML IJ SOLN
INTRAMUSCULAR | Status: AC
  Filled 2023-03-11: qty 10

## 2023-03-11 MED ORDER — IOHEXOL 300 MG/ML  SOLN
INTRAMUSCULAR | Status: DC | PRN
  Administered 2023-03-11: 120 mL

## 2023-03-11 MED ORDER — VERAPAMIL HCL 2.5 MG/ML IV SOLN
INTRAVENOUS | Status: DC | PRN
  Administered 2023-03-11: 2.5 mg via INTRA_ARTERIAL

## 2023-03-11 MED ORDER — ATROPINE SULFATE 1 MG/10ML IJ SOSY
PREFILLED_SYRINGE | INTRAMUSCULAR | Status: AC
  Filled 2023-03-11: qty 10

## 2023-03-11 MED ORDER — HEPARIN (PORCINE) IN NACL 2000-0.9 UNIT/L-% IV SOLN
INTRAVENOUS | Status: DC | PRN
  Administered 2023-03-11: 1000 mL

## 2023-03-11 MED ORDER — SODIUM CHLORIDE 0.9 % IV SOLN: 250.0000 mL | INTRAVENOUS | Status: DC | PRN

## 2023-03-11 MED ORDER — POTASSIUM CHLORIDE CRYS ER 20 MEQ PO TBCR
40.0000 meq | EXTENDED_RELEASE_TABLET | Freq: Once | ORAL | Status: AC
  Administered 2023-03-11: 40 meq via ORAL
  Filled 2023-03-11: qty 2

## 2023-03-11 MED ORDER — NITROGLYCERIN 0.4 MG SL SUBL
SUBLINGUAL_TABLET | SUBLINGUAL | Status: AC
  Filled 2023-03-11: qty 1

## 2023-03-11 MED ORDER — CANGRELOR TETRASODIUM 50 MG IV SOLR
INTRAVENOUS | Status: AC
  Filled 2023-03-11: qty 50

## 2023-03-11 MED ORDER — FENTANYL CITRATE (PF) 100 MCG/2ML IJ SOLN
INTRAMUSCULAR | Status: AC
  Filled 2023-03-11: qty 2

## 2023-03-11 MED ORDER — TICAGRELOR 90 MG PO TABS
ORAL_TABLET | ORAL | Status: DC | PRN
  Administered 2023-03-11: 180 mg via ORAL

## 2023-03-11 MED ORDER — OXYCODONE-ACETAMINOPHEN 5-325 MG PO TABS
2.0000 | ORAL_TABLET | Freq: Once | ORAL | Status: AC
  Administered 2023-03-11: 2 via ORAL

## 2023-03-11 MED ORDER — NITROGLYCERIN 0.4 MG SL SUBL
SUBLINGUAL_TABLET | SUBLINGUAL | Status: DC | PRN
  Administered 2023-03-11: .4 mg via SUBLINGUAL

## 2023-03-11 MED ORDER — LABETALOL HCL 5 MG/ML IV SOLN: 10.0000 mg | INTRAVENOUS | Status: AC | PRN

## 2023-03-11 MED ORDER — LIDOCAINE HCL 1 % IJ SOLN
INTRAMUSCULAR | Status: AC
  Filled 2023-03-11: qty 20

## 2023-03-11 MED ORDER — SODIUM CHLORIDE 0.9 % IV SOLN: INTRAVENOUS | Status: AC

## 2023-03-11 MED ORDER — MIDAZOLAM HCL 2 MG/2ML IJ SOLN
INTRAMUSCULAR | Status: DC | PRN
  Administered 2023-03-11: 1 mg via INTRAVENOUS

## 2023-03-11 MED ORDER — AMLODIPINE BESYLATE 10 MG PO TABS
10.0000 mg | ORAL_TABLET | Freq: Every day | ORAL | Status: DC
  Administered 2023-03-11 – 2023-03-12 (×2): 10 mg via ORAL
  Filled 2023-03-11 (×2): qty 1

## 2023-03-11 MED ORDER — LIDOCAINE HCL (PF) 1 % IJ SOLN
INTRAMUSCULAR | Status: DC | PRN
  Administered 2023-03-11: 2 mL

## 2023-03-11 MED ORDER — SODIUM CHLORIDE 0.9% FLUSH: 3.0000 mL | INTRAVENOUS | Status: DC | PRN

## 2023-03-11 MED ORDER — FENTANYL CITRATE (PF) 100 MCG/2ML IJ SOLN
INTRAMUSCULAR | Status: DC | PRN
  Administered 2023-03-11 (×2): 25 ug via INTRAVENOUS

## 2023-03-11 MED ORDER — ENOXAPARIN SODIUM 40 MG/0.4ML IJ SOSY
40.0000 mg | PREFILLED_SYRINGE | INTRAMUSCULAR | Status: DC
  Administered 2023-03-12: 40 mg via SUBCUTANEOUS
  Filled 2023-03-11 (×2): qty 0.4

## 2023-03-11 MED ORDER — OXYCODONE-ACETAMINOPHEN 5-325 MG PO TABS
ORAL_TABLET | ORAL | Status: AC
  Filled 2023-03-11: qty 2

## 2023-03-11 MED ORDER — VERAPAMIL HCL 2.5 MG/ML IV SOLN
INTRAVENOUS | Status: AC
  Filled 2023-03-11: qty 2

## 2023-03-11 MED ORDER — NITROGLYCERIN 1 MG/10 ML FOR IR/CATH LAB
INTRA_ARTERIAL | Status: DC | PRN
  Administered 2023-03-11 (×3): 200 ug via INTRACORONARY

## 2023-03-11 MED ORDER — MIDAZOLAM HCL 2 MG/2ML IJ SOLN
INTRAMUSCULAR | Status: AC
  Filled 2023-03-11: qty 2

## 2023-03-11 MED ORDER — HEPARIN SODIUM (PORCINE) 1000 UNIT/ML IJ SOLN
INTRAMUSCULAR | Status: DC | PRN
  Administered 2023-03-11: 2000 [IU] via INTRAVENOUS
  Administered 2023-03-11: 4000 [IU] via INTRAVENOUS
  Administered 2023-03-11 (×2): 2000 [IU] via INTRAVENOUS

## 2023-03-11 MED ORDER — SODIUM CHLORIDE 0.9% FLUSH
3.0000 mL | Freq: Two times a day (BID) | INTRAVENOUS | Status: DC
  Administered 2023-03-11 – 2023-03-12 (×3): 3 mL via INTRAVENOUS

## 2023-03-11 MED ORDER — ALUM & MAG HYDROXIDE-SIMETH 200-200-20 MG/5ML PO SUSP: 30.0000 mL | Freq: Four times a day (QID) | ORAL | Status: DC | PRN

## 2023-03-11 MED ORDER — SODIUM CHLORIDE 0.9 % IV SOLN
4.0000 ug/kg/min | INTRAVENOUS | Status: AC
  Filled 2023-03-11: qty 50

## 2023-03-11 MED ORDER — HEPARIN (PORCINE) IN NACL 1000-0.9 UT/500ML-% IV SOLN
INTRAVENOUS | Status: AC
  Filled 2023-03-11: qty 1000

## 2023-03-11 MED ORDER — SODIUM CHLORIDE 0.9 % IV SOLN
INTRAVENOUS | Status: DC | PRN
  Administered 2023-03-11: 4 ug/kg/min via INTRAVENOUS

## 2023-03-11 MED ORDER — HYDRALAZINE HCL 20 MG/ML IJ SOLN: 10.0000 mg | INTRAMUSCULAR | Status: AC | PRN

## 2023-03-11 MED ORDER — TICAGRELOR 90 MG PO TABS
90.0000 mg | ORAL_TABLET | Freq: Two times a day (BID) | ORAL | Status: DC
  Administered 2023-03-11 – 2023-03-12 (×2): 90 mg via ORAL
  Filled 2023-03-11 (×2): qty 1

## 2023-03-11 MED ORDER — CANGRELOR BOLUS VIA INFUSION
INTRAVENOUS | Status: DC | PRN
  Administered 2023-03-11: 1905 ug via INTRAVENOUS

## 2023-03-11 MED ORDER — TICAGRELOR 90 MG PO TABS
ORAL_TABLET | ORAL | Status: AC
  Filled 2023-03-11: qty 1

## 2023-03-11 SURGICAL SUPPLY — 25 items
BALLN MINITREK RX 2.0X12 (BALLOONS) ×1 IMPLANT
BALLN ~~LOC~~ TREK NEO RX 3.0X12 (BALLOONS) ×1 IMPLANT
BALLOON MINITREK RX 2.0X12 (BALLOONS) IMPLANT
BALLOON ~~LOC~~ TREK NEO RX 3.0X12 (BALLOONS) IMPLANT
CATH 5F 110X4 TIG (CATHETERS) IMPLANT
CATH LAUNCHER 5F EBU3.0 (CATHETERS) IMPLANT
CATH LAUNCHER 6FR EBU 3 (CATHETERS) IMPLANT
CATH VISTA GUIDE 6FR XBLAD3.5 (CATHETERS) IMPLANT
CATHETER LAUNCHER 5F EBU3.0 (CATHETERS) ×1 IMPLANT
DEVICE RAD TR BAND REGULAR (VASCULAR PRODUCTS) IMPLANT
DRAPE BRACHIAL (DRAPES) IMPLANT
GLIDESHEATH SLEND SS 6F .021 (SHEATH) IMPLANT
GUIDEWIRE INQWIRE 1.5J.035X260 (WIRE) IMPLANT
GUIDEWIRE PRESSURE X 175 (WIRE) IMPLANT
INQWIRE 1.5J .035X260CM (WIRE) ×1 IMPLANT
KIT ENCORE 26 ADVANTAGE (KITS) IMPLANT
KIT SYRINGE INJ CVI SPIKEX1 (MISCELLANEOUS) IMPLANT
PACK CARDIAC CATH (CUSTOM PROCEDURE TRAY) ×1 IMPLANT
PAD ELECT DEFIB RADIOL ZOLL (MISCELLANEOUS) IMPLANT
PROTECTION STATION PRESSURIZED (MISCELLANEOUS) ×1 IMPLANT
SET ATX-X65L (MISCELLANEOUS) IMPLANT
STATION PROTECTION PRESSURIZED (MISCELLANEOUS) IMPLANT
STENT ONYX FRONTIER 2.75X18 (Permanent Stent) IMPLANT
WIRE HITORQ VERSACORE ST 145CM (WIRE) IMPLANT
WIRE RUNTHROUGH .014X180CM (WIRE) IMPLANT

## 2023-03-11 NOTE — Progress Notes (Signed)
Progress Note   Patient: Meredith Russell [id number]:[id number] DOB: 1951-05-19 DOA: 03/07/2023     0 DOS: the patient was seen and examined on 03/11/2023   Brief hospital course: Meredith Russell is a 72 y.o. Caucasian female with medical history significant for rheumatoid arthritis, who presented to emergency room with acute onset of intermittent chest pain over the last 4 months.  She describes a midsternal and extending from her neck down to her sternum with associated palpitations and mainly exertional and improving with rest for few minutes.  She admitted to being sometimes confused with her rheumatoid arthritis symptoms that involve her neck and sternum.  She denied any dyspnea or wheeze.  She admits to mild dry cough.  No fever or chills.  She denied any nausea or vomiting or pheresis today with her pain.  No bleeding diathesis.  No dysuria, liquidy or hematuria or flank pain.   ED Course: When the came to the ER vital signs revealed a BP of 145/76 with otherwise normal vital signs.  Labs revealed a blood glucose of 146 with otherwise normal BMP.  High sensitive troponin I was 46 and later the same.  CBC showed WBC of 11 and platelets 488 with otherwise normal H&H. EKG as reviewed by me : EKG showed normal sinus rhythm with a rate of 65 Imaging: Two-view chest x-ray revealed hyperinflation that is new from the remote prior with some scattered interstitial opacities the could be acute versus chronic.   The patient was given four aspirin.  She will be admitted to an ops patient telemetry bed for further evaluation and management.    Assessment and Plan:  Unstable angina Status post left heart cath which showed severe multivessel coronary artery disease, as detailed below. This includes 95% mid LAD stenosis as well as chronic total occlusions of small D1 and OM 2 branches. There is moderate multifocal LCx/OM disease; the 70% mid LCx stenosis is not hemodynamically significant by flow assessment (RFR =  0.93). RCA demonstrates mild to moderate, nonobstructive disease.  Successful PCI to mid LAD using Onyx Frontier 2.75 x 18 mm drug-eluting stent with 0% residual stenosis and TIMI-3 flow.  Cardiology recommends dual antiplatelet therapy with aspirin and ticagrelor for at least 12 months.  If symptomatic bradycardia is an issue, the patient could be transitioned to clopidogrel. Aggressive secondary prevention of coronary artery disease.  Favor medical therapy of LCx/OM/D1 disease.     Essential hypertension Continue amlodipine, increase dose to 10 mg to optimize blood pressure control Not on beta-blockers due to resting bradycardia     Rheumatoid arthritis (HCC) Continue prednisone and leflunomide No current flare.     Depression Continue escitalopram and trazodone     Subjective: Patient is seen and examined at the bedside.  No new complaints  Physical Exam: Vitals:   03/11/23 1200 03/11/23 1215 03/11/23 1230 03/11/23 1245  BP: (!) 150/76  (!) 150/76 (!) 155/69  Pulse: (!) 56 (!) 57 (!) 53 (!) 50  Resp: 12 19 11 11   Temp:      TempSrc:      SpO2: 98% 98% 99% 99%  Weight:      Height:       GENERAL:  72 y.o.-year-old Caucasian female patient lying in the bed with no acute distress.  EYES: Pupils equal, round, reactive to light and accommodation. No scleral icterus. Extraocular muscles intact.  HEENT: Head atraumatic, normocephalic. Oropharynx and nasopharynx clear.  NECK:  Supple, no jugular venous distention. No thyroid  enlargement, no tenderness.  LUNGS: Normal breath sounds bilaterally, no wheezing, rales,rhonchi or crepitation. No use of accessory muscles of respiration.  CARDIOVASCULAR: Regular rate and rhythm, S1, S2 normal. No murmurs, rubs, or gallops.  ABDOMEN: Soft, nondistended, nontender. Bowel sounds present. No organomegaly or mass.  EXTREMITIES: No pedal edema, cyanosis, or clubbing.  NEUROLOGIC: Cranial nerves II through XII are intact. Muscle strength 5/5 in  all extremities. Sensation intact. Gait not checked.  PSYCHIATRIC: The patient is alert and oriented x 3.  Normal affect and good eye contact. SKIN: No obvious rash, lesion, or ulcer.       Data Reviewed: Labs reviewed There are no new results to review at this time.  Family Communication: Plan of care discussed with patient in detail  Disposition: Status is: Observation The patient will require care spanning > 2 midnights and should be moved to inpatient because: Will require continued hospitalization monitoring following stent angioplasty  Planned Discharge Destination: Home    Time spent: 34 minutes  Author: Lucile Shutters, MD 03/11/2023 2:14 PM  For on call review [email or web address].

## 2023-03-11 NOTE — TOC Benefit Eligibility Note (Signed)
Patient Product/process development scientist completed.    The patient is insured through [employer]. Patient has Medicare and is not eligible for a copay card, but may be able to apply for patient assistance, if available.    Ran test claim for Brilinta 90 mg and the current 30 day co-pay is $0.00.   This test claim was processed through [facility]- copay amounts may vary at other pharmacies due to pharmacy/plan contracts, or as the patient moves through the different stages of their insurance plan.     Roland Earl, CPHT Pharmacy Technician III Certified Patient Advocate [facility] Patient Advocate Team Direct Number: [phone]  Fax: [phone]

## 2023-03-11 NOTE — Plan of Care (Signed)
  Problem: Education: Goal: Understanding of cardiac disease, CV risk reduction, and recovery process will improve Outcome: Progressing Goal: Individualized Educational Video(s) Outcome: Progressing   Problem: Activity: Goal: Ability to tolerate increased activity will improve Outcome: Progressing   Problem: Cardiac: Goal: Ability to achieve and maintain adequate cardiovascular perfusion will improve Outcome: Progressing   Problem: Health Behavior/Discharge Planning: Goal: Ability to safely manage health-related needs after discharge will improve Outcome: Progressing   Problem: Education: Goal: Understanding of CV disease, CV risk reduction, and recovery process will improve Outcome: Progressing Goal: Individualized Educational Video(s) Outcome: Progressing   Problem: Activity: Goal: Ability to return to baseline activity level will improve Outcome: Progressing

## 2023-03-11 NOTE — Progress Notes (Signed)
Critical value result for Troponin. Result of 607 ng/L. Informed cardiology on call Dr. Ladona Ridgel via phone call. Result acknowledge. As per pt she felt chest discomfort after taking the potassium pill. Pt currently denies chest discomfort. No new orders.

## 2023-03-11 NOTE — Interval H&P Note (Signed)
History and Physical Interval Note:  03/11/2023 7:34 AM  [name]  has presented today for surgery, with the diagnosis of unstable angina.  The various methods of treatment have been discussed with the patient and family. After consideration of risks, benefits and other options for treatment, the patient has consented to  Procedure(s): LEFT HEART CATH AND CORONARY ANGIOGRAPHY (N/A) as a surgical intervention.  The patient's history has been reviewed, patient examined, no change in status, stable for surgery.  I have reviewed the patient's chart and labs.  Questions were answered to the patient's satisfaction.    Cath Lab Visit (complete for each Cath Lab visit)  Clinical Evaluation Leading to the Procedure:   ACS: Yes.    Non-ACS:  N/A  Alvan Culpepper

## 2023-03-11 NOTE — Progress Notes (Signed)
   Patient Name: Meredith Russell Date of Encounter: 03/11/2023 [facility] Cardiologist: None   Interval Summary  .    Continued musculoskeletal pain overnight.  Experienced some mild chest discomfort walking around the nurses station yesterday afternoon but nothing this morning.  Cath showed severe mid LAD stenosis and moderate mid LCx, OM1, and RCA disease; CTO's of OM2 and D1 noted.  Vital Signs .    Vitals:   03/10/23 2025 03/11/23 0008 03/11/23 0535 03/11/23 0712  BP: (!) 160/75 130/72 (!) 141/83 (!) 179/104  Pulse: 61 (!) 51 60 62  Resp: 18 18 15 18   Temp: 97.7 F (36.5 C) (!) 97.4 F (36.3 C) 98.3 F (36.8 C) 98.3 F (36.8 C)  TempSrc: Oral Oral Oral Oral  SpO2: 97% 98% 100% 96%  Weight:      Height:        Intake/Output Summary (Last 24 hours) at 03/11/2023 0954 Last data filed at 03/11/2023 0500 Gross per 24 hour  Intake 792.21 ml  Output --  Net 792.21 ml      03/07/2023    3:50 PM 11/04/2022    2:22 PM 07/22/2022   10:01 AM  Last 3 Weights  Weight (lbs) 139 lb 15.9 oz 140 lb 134 lb 12.8 oz  Weight (kg) 63.5 kg 63.504 kg 61.145 kg      Telemetry/ECG    Sinus bradycardia and normal sinus rhythm - Personally Reviewed  Physical Exam .   GEN: No acute distress.   Neck: No JVD Cardiac: Bradycardic but regular; no murmurs, rubs, or gallops.  Respiratory: Clear to auscultation bilaterally. GI: Soft, nontender, non-distended  MS: No edema  Assessment & Plan .     Unstable angina: Patient with progressive exertional chest pain and shortness of breath over the last 3-4 month.  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  Patient underwent successful PCI to mid LAD; LCx disease not treated, as RFR was not significant.  Patient with some mild residual chest pain s/p cath and whole body discomfort, likely from RA and lying on cath table. -Complete 2  hours of cangrelor infusion. -DAPT with aspirin and ticagerlor for at least 12 months; if bradycardia remains an issue, may need to consider transitioning to clopidogrel. -Continue rosuvastatin 10 mg daily; patient not on high-intensity statin therapy as LDL was reasonable off treatment (LDL 89) and concern about potential hepatotoxicity with RA therapies.  Hypertension: BP still suboptimally controlled. -Increase amlodipine to 10 mg daily. -Could consider addition of ARB if additional BP control is needed.  Hyperlipidemia: -Continue rosuvastatin 10 mg daily.  Rheumatoid and psoriatic arthritis: -Continue prednisone; ongoing management per primary team. -Oxycodone/APAP and hydrormorphone available PRN.  Dispo: Anticipate discharge tomorrow if patient recovers well from today's cath/PCI.  For questions or updates, please contact [facility] Please consult [email or web address] for contact info under [facility].     Signed, Yvonne Kendall, MD

## 2023-03-12 ENCOUNTER — Encounter: Payer: Self-pay | Admitting: Internal Medicine

## 2023-03-12 DIAGNOSIS — I214 Non-ST elevation (NSTEMI) myocardial infarction: Secondary | ICD-10-CM | POA: Diagnosis not present

## 2023-03-12 DIAGNOSIS — I2089 Other forms of angina pectoris: Secondary | ICD-10-CM

## 2023-03-12 DIAGNOSIS — I10 Essential (primary) hypertension: Secondary | ICD-10-CM | POA: Diagnosis not present

## 2023-03-12 DIAGNOSIS — I200 Unstable angina: Secondary | ICD-10-CM | POA: Diagnosis not present

## 2023-03-12 DIAGNOSIS — R079 Chest pain, unspecified: Secondary | ICD-10-CM

## 2023-03-12 DIAGNOSIS — M069 Rheumatoid arthritis, unspecified: Secondary | ICD-10-CM | POA: Diagnosis not present

## 2023-03-12 DIAGNOSIS — Z5181 Encounter for therapeutic drug level monitoring: Secondary | ICD-10-CM

## 2023-03-12 DIAGNOSIS — E782 Mixed hyperlipidemia: Secondary | ICD-10-CM

## 2023-03-12 LAB — BASIC METABOLIC PANEL
Anion gap: 8 (ref 5–15)
BUN: 14 mg/dL (ref 8–23)
CO2: 29 mmol/L (ref 22–32)
Calcium: 8.8 mg/dL — ABNORMAL LOW (ref 8.9–10.3)
Chloride: 102 mmol/L (ref 98–111)
Creatinine, Ser: 0.85 mg/dL (ref 0.44–1.00)
GFR, Estimated: >60 mL/min (ref >60)
Glucose, Bld: 100 mg/dL — ABNORMAL HIGH (ref 70–99)
Potassium: 4.8 mmol/L (ref 3.5–5.1)
Sodium: 139 mmol/L (ref 135–145)

## 2023-03-12 LAB — CBC
HCT: 35.7 % — ABNORMAL LOW (ref 36.0–46.0)
Hemoglobin: 11.8 g/dL — ABNORMAL LOW (ref 12.0–15.0)
MCH: 32 pg (ref 26.0–34.0)
MCHC: 33.1 g/dL (ref 30.0–36.0)
MCV: 96.7 fL (ref 80.0–100.0)
Platelets: 402 10*3/uL — ABNORMAL HIGH (ref 150–400)
RBC: 3.69 MIL/uL — ABNORMAL LOW (ref 3.87–5.11)
RDW: 12.7 % (ref 11.5–15.5)
WBC: 11.1 10*3/uL — ABNORMAL HIGH (ref 4.0–10.5)
nRBC: 0 % (ref 0.0–0.2)

## 2023-03-12 MED ORDER — TICAGRELOR 90 MG PO TABS: 90.0000 mg | ORAL_TABLET | Freq: Two times a day (BID) | ORAL | 0 refills | Status: DC

## 2023-03-12 MED ORDER — AMLODIPINE BESYLATE 10 MG PO TABS: 10.0000 mg | ORAL_TABLET | Freq: Every day | ORAL | 0 refills | Status: DC

## 2023-03-12 MED ORDER — ASPIRIN 81 MG PO TBEC: 81.0000 mg | DELAYED_RELEASE_TABLET | Freq: Every day | ORAL | 0 refills | Status: AC

## 2023-03-12 MED ORDER — ROSUVASTATIN CALCIUM 10 MG PO TABS: 10.0000 mg | ORAL_TABLET | Freq: Every day | ORAL | 0 refills | Status: DC

## 2023-03-12 NOTE — Plan of Care (Signed)

## 2023-03-12 NOTE — Discharge Summary (Signed)
Physician Discharge Summary  Meredith Russell WGN:[id number] DOB: 05-24-1951 DOA: 03/07/2023  PCP: Malva Limes, MD  Admit date: 03/07/2023 Discharge date: 03/12/2023  Admitted From: home  Disposition:  home   Recommendations for Outpatient Follow-up:  Follow up with PCP in 1-2 weeks F/u w/ cardio, Dr. Okey Dupre, in 1-2 weeks  Home Health: no Equipment/Devices:  Discharge Condition: stable  CODE STATUS: full  Diet recommendation: Heart Healthy   Brief/Interim Summary: HPI was taken from Dr. Arville Care: Meredith Russell is a 72 y.o. Caucasian female with medical history significant for rheumatoid arthritis, who presented to emergency room with acute onset of intermittent chest pain over the last 4 months.  She describes a midsternal and extending from her neck down to her sternum with associated palpitations and mainly exertional and improving with rest for few minutes.  She admitted to being sometimes confused with her rheumatoid arthritis symptoms that involve her neck and sternum.  She denied any dyspnea or wheeze.  She admits to mild dry cough.  No fever or chills.  She denied any nausea or vomiting or pheresis today with her pain.  No bleeding diathesis.  No dysuria, liquidy or hematuria or flank pain.   ED Course: When the came to the ER vital signs revealed a BP of 145/76 with otherwise normal vital signs.  Labs revealed a blood glucose of 146 with otherwise normal BMP.  High sensitive troponin I was 46 and later the same.  CBC showed WBC of 11 and platelets 488 with otherwise normal H&H. EKG as reviewed by me : EKG showed normal sinus rhythm with a rate of 65 Imaging: Two-view chest x-ray revealed hyperinflation that is new from the remote prior with some scattered interstitial opacities the could be acute versus chronic.   The patient was given for review aspirin.  She will be admitted to an ops patient telemetry bed for further evaluation and management.    Discharge Diagnoses:  Principal  Problem:   Chest pain Active Problems:   Essential hypertension   Rheumatoid arthritis (HCC)   Depression   Unstable angina (HCC)   Stable angina   Non-ST elevation (NSTEMI) myocardial infarction (HCC)   Mixed hyperlipidemia  Unstable angina: s/p left heart cath showed severe mid LAD stenosis & mod mid Lcx, OM1, & RCA disease; CTOs of OM2 & D1 noted. Stent placed to LAD as per cardio. Continue on aspirin, statin, brilinta, amlodipine as per cardio. No BB secondary to bradycardia. Continue on dual antiplatelet therapy with aspirin and ticagrelor for at least 12 months.    HTN: continue on amlodipine. No BB secondary to bradycardia    Rheumatoid arthritis: continue on prednisone, leflunomide    Depression: severity unknown. Continue on home dose of citalopram    Discharge Instructions  Discharge Instructions     AMB Referral to Cardiac Rehabilitation - Phase II   Complete by: As directed    Diagnosis: Coronary Stents   After initial evaluation and assessments completed: Virtual Based Care may be provided alone or in conjunction with Phase 2 Cardiac Rehab based on patient barriers.: Yes   Intensive Cardiac Rehabilitation (ICR) [facility] location only OR Traditional Cardiac Rehabilitation (TCR) *If criteria for ICR are not met will enroll in TCR [facility] only): Yes   Diet - low sodium heart healthy   Complete by: As directed    Discharge instructions   Complete by: As directed    F/u w/ PCP in 1-2 weeks. F/u w/ cardio, Dr. Okey Dupre, in 1-2  weeks   Increase activity slowly   Complete by: As directed       Allergies as of 03/12/2023       Reactions   Adalimumab Itching, Rash   Psoriasis   Morphine Other (See Comments)   Tachycardia   Morphine And Codeine Other (See Comments)   Tachycardia   Pregabalin Other (See Comments)   Increased BP    Savella  [milnacipran] Rash   Sulfa Antibiotics Nausea And Vomiting        Medication List     STOP taking these medications    diclofenac 50  MG EC tablet Commonly known as: VOLTAREN       TAKE these medications    amLODipine 10 MG tablet Commonly known as: NORVASC Take 1 tablet (10 mg total) by mouth daily. Start taking on: March 13, 2023 What changed:  medication strength how much to take   ascorbic acid 500 MG tablet Commonly known as: VITAMIN C Take 500 mg by mouth daily.   aspirin EC 81 MG tablet Take 1 tablet (81 mg total) by mouth daily. Swallow whole. Start taking on: March 13, 2023   butalbital-acetaminophen-caffeine 50-325-40 MG tablet Commonly known as: FIORICET TAKE 1-2 TABLET BY MOUTH EVERY 6 (SIX) HOURS AS NEEDED FOR HEADACHE.   calcipotriene 0.005 % cream Commonly known as: DOVONOX Apply 1 application  topically 3 (three) times daily as needed (psoriasis).   citalopram 20 MG tablet Commonly known as: CELEXA Take 1 tablet (20 mg total) by mouth daily.   cyanocobalamin 1000 MCG tablet Commonly known as: VITAMIN B12 Take 1,000 mcg by mouth daily.   diclofenac Sodium 1 % Gel Commonly known as: VOLTAREN Apply topically.   fexofenadine 180 MG tablet Commonly known as: ALLEGRA TAKE 1 TABLET BY MOUTH EVERY DAY   fluticasone 50 MCG/ACT nasal spray Commonly known as: FLONASE USE 2 SPRAYS IN BOTH NOSTRILS  DAILY AS NEEDED FOR ALLERGIES   folic acid 1 MG tablet Commonly known as: FOLVITE Take 1 mg by mouth daily.   halobetasol 0.05 % ointment Commonly known as: ULTRAVATE Apply 1 application topically daily as needed (psoriasis).   LORazepam 1 MG tablet Commonly known as: ATIVAN TAKE ONE-HALF TO ONE TABLET BY MOUTH TWICE DAILY AS NEEDED FOR ANXIETY   mupirocin ointment 2 % Commonly known as: BACTROBAN Apply 1 Application topically daily.   oxyCODONE-acetaminophen 7.5-325 MG tablet Commonly known as: PERCOCET Take 1 tablet by mouth every 6 (six) hours as needed for severe pain.   pramoxine-hydrocortisone 1-1 % rectal cream Commonly known as: Analpram-HC Place 1 application  rectally 2 (two) times daily. What changed:  when to take this reasons to take this   predniSONE 5 MG tablet Commonly known as: DELTASONE Take 5-20 mg by mouth as directed. Take 4 tablets (20 mg total) by mouth once daily for 3 days, THEN 3 tablets (15 mg total) once daily for 3 days, THEN 2 tablets (10 mg total) once daily for 3 days, THEN 1 tablet (5 mg total) once daily for 3 days. Take in the mornings..   rosuvastatin 10 MG tablet Commonly known as: CRESTOR Take 1 tablet (10 mg total) by mouth daily. Start taking on: March 13, 2023   ticagrelor 90 MG Tabs tablet Commonly known as: BRILINTA Take 1 tablet (90 mg total) by mouth 2 (two) times daily.   tiZANidine 2 MG tablet Commonly known as: ZANAFLEX Take 2 mg by mouth 3 (three) times daily. What changed: Another medication with the same  name was removed. Continue taking this medication, and follow the directions you see here.   triamcinolone cream 0.5 % Commonly known as: KENALOG Apply 1 application topically 2 (two) times daily. As needed   Vitamin D3 25 MCG (1000 UT) Caps Take 1,000 Units by mouth daily.   zolpidem 10 MG tablet Commonly known as: AMBIEN TAKE ONE TABLET BY MOUTH ONE TIME DAILY AT BEDTIME        Follow-up Information     Charlsie Quest, NP Follow up on 03/20/2023.   Specialty: Cardiology Why: 2:45 PM Contact information: [address] [place] [address] [phone]                Allergies  Allergen Reactions   Adalimumab Itching and Rash    Psoriasis   Morphine Other (See Comments)    Tachycardia   Morphine And Codeine Other (See Comments)    Tachycardia   Pregabalin Other (See Comments)    Increased BP    Savella  [Milnacipran] Rash   Sulfa Antibiotics Nausea And Vomiting    Consultations: Cardio    Procedures/Studies: CARDIAC CATHETERIZATION  Result Date: 03/11/2023 Conclusions: Severe multivessel coronary artery disease, as detailed below.   This includes 95% mid LAD stenosis as well as chronic total occlusions of small D1 and OM 2 branches.  There is moderate multifocal LCx/OM disease; the 70% mid LCx stenosis is not hemodynamically significant by flow assessment (RFR = 0.93).  RCA demonstrates mild to moderate, nonobstructive disease. 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 Normal left ventricular filling pressure (LVEDP 13 mmHg). Successful PCI to mid LAD using Onyx Frontier 2.75 x 18 mm drug-eluting stent with 0% residual stenosis and TIMI-3 flow. Tortuous right brachial and innominate arteries; consider use of a long sheath or alternative access if future catheterizations are needed. Recommendations: Complete 2 hours of cangrelor infusion. Dual antiplatelet therapy with aspirin and ticagrelor for at least 12 months.  If symptomatic bradycardia is an issue, the patient could be transitioned to clopidogrel. Aggressive secondary prevention of coronary artery disease.  Favor medical therapy of LCx/OM/D1 disease. Yvonne Kendall, MD [facility]  ECHOCARDIOGRAM COMPLETE  Result Date: 03/08/2023    ECHOCARDIOGRAM REPORT   Patient Name:   Meredith Russell Date of Exam: 03/08/2023 Medical Rec #:  [id number]      Height:       64.0 in Accession #:    [id number]     Weight:       140.0 lb Date of Birth:  Dec 05, 1950      BSA:          1.681 m Patient Age:    71 years       BP:           116/59 mmHg Patient Gender: F              HR:           64 bpm. Exam Location:  [facility] Procedure: 2D Echo and Strain Analysis Indications:     Chest Pain R07.9  History:         Patient has no prior history of Echocardiogram examinations.  Sonographer:     Overton Mam RDCS, FASE Referring Phys:  [id number] SHERI HAMMOCK Diagnosing Phys: Chilton Si MD  Sonographer Comments: Global longitudinal strain was attempted. IMPRESSIONS  1. Left ventricular ejection fraction, by estimation, is 60 to 65%. The left ventricle has normal  function.  The left ventricle has no regional wall motion abnormalities. Left ventricular diastolic parameters were normal. The average left ventricular global longitudinal strain is -18.9 %. The global longitudinal strain is normal.  2. Right ventricular systolic function is normal. The right ventricular size is normal. There is normal pulmonary artery systolic pressure.  3. The mitral valve is normal in structure. Trivial mitral valve regurgitation. No evidence of mitral stenosis.  4. The aortic valve is tricuspid. There is mild calcification of the aortic valve. There is mild thickening of the aortic valve. Aortic valve regurgitation is trivial. No aortic stenosis is present.  5. The inferior vena cava is normal in size with greater than 50% respiratory variability, suggesting right atrial pressure of 3 mmHg. FINDINGS  Left Ventricle: Left ventricular ejection fraction, by estimation, is 60 to 65%. The left ventricle has normal function. The left ventricle has no regional wall motion abnormalities. The average left ventricular global longitudinal strain is -18.9 %. The global longitudinal strain is normal. The left ventricular internal cavity size was normal in size. There is no left ventricular hypertrophy. Left ventricular diastolic parameters were normal. Normal left ventricular filling pressure. Right Ventricle: The right ventricular size is normal. No increase in right ventricular wall thickness. Right ventricular systolic function is normal. There is normal pulmonary artery systolic pressure. The tricuspid regurgitant velocity is 2.79 m/s, and  with an assumed right atrial pressure of 3 mmHg, the estimated right ventricular systolic pressure is 34.1 mmHg. Left Atrium: Left atrial size was normal in size. Right Atrium: Right atrial size was normal in size. Pericardium: There is no evidence of pericardial effusion. Mitral Valve: The mitral valve is normal in structure. Trivial mitral valve regurgitation. No  evidence of mitral valve stenosis. Tricuspid Valve: The tricuspid valve is normal in structure. Tricuspid valve regurgitation is trivial. No evidence of tricuspid stenosis. Aortic Valve: The aortic valve is tricuspid. There is mild calcification of the aortic valve. There is mild thickening of the aortic valve. Aortic valve regurgitation is trivial. No aortic stenosis is present. Aortic valve peak gradient measures 9.4 mmHg. Pulmonic Valve: The pulmonic valve was normal in structure. Pulmonic valve regurgitation is not visualized. No evidence of pulmonic stenosis. Aorta: The aortic root is normal in size and structure. Venous: The inferior vena cava is normal in size with greater than 50% respiratory variability, suggesting right atrial pressure of 3 mmHg. IAS/Shunts: No atrial level shunt detected by color flow Doppler.  LEFT VENTRICLE PLAX 2D LVIDd:         4.80 cm   Diastology LVIDs:         3.10 cm   LV e' medial:    10.30 cm/s LV PW:         0.90 cm   LV E/e' medial:  8.6 LV IVS:        1.00 cm   LV e' lateral:   12.30 cm/s LVOT diam:     1.80 cm   LV E/e' lateral: 7.2 LV SV:         68 LV SV Index:   40        2D Longitudinal Strain LVOT Area:     2.54 cm  2D Strain GLS (A2C):   -21.9 %                          2D Strain GLS (A3C):   -18.2 %  2D Strain GLS (A4C):   -16.5 %                          2D Strain GLS Avg:     -18.9 % RIGHT VENTRICLE RV Basal diam:  2.70 cm RV S prime:     11.40 cm/s TAPSE (M-mode): 2.2 cm LEFT ATRIUM             Index        RIGHT ATRIUM           Index LA diam:        3.50 cm 2.08 cm/m   RA Area:     14.50 cm LA Vol (A2C):   41.8 ml 24.86 ml/m  RA Volume:   34.10 ml  20.28 ml/m LA Vol (A4C):   38.5 ml 22.90 ml/m LA Biplane Vol: 42.9 ml 25.52 ml/m  AORTIC VALVE                 PULMONIC VALVE AV Area (Vmax): 1.80 cm     PV Vmax:        0.91 m/s AV Vmax:        153.00 cm/s  PV Peak grad:   3.3 mmHg AV Peak Grad:   9.4 mmHg     RVOT Peak grad: 3 mmHg  LVOT Vmax:      108.00 cm/s LVOT Vmean:     70.600 cm/s LVOT VTI:       0.267 m  AORTA Ao Root diam: 3.20 cm Ao Asc diam:  2.80 cm MITRAL VALVE               TRICUSPID VALVE MV Area (PHT): 2.92 cm    TR Peak grad:   31.1 mmHg MV Decel Time: 260 msec    TR Vmax:        279.00 cm/s MV E velocity: 88.60 cm/s MV A velocity: 79.70 cm/s  SHUNTS MV E/A ratio:  1.11        Systemic VTI:  0.27 m                            Systemic Diam: 1.80 cm Chilton Si MD Electronically signed by Chilton Si MD Signature Date/Time: 03/08/2023/1:50:26 PM    Final    DG Chest 2 View  Result Date: 03/07/2023 CLINICAL DATA:  Chest pain EXAM: CHEST - 2 VIEW COMPARISON:  X-ray 09/05/2008 FINDINGS: Hyperinflation. No consolidation, pneumothorax or effusion. No edema. There is some new patchy interstitial opacities in both lungs. Acute versus chronic. Normal cardiopericardial silhouette. Calcified aorta. Curvature of the spine. Degenerative changes identified. Fixation hardware along the lower cervical spine. Right shoulder arthroplasty. IMPRESSION: Hyperinflation. New from the remote prior are some scattered interstitial opacities. This could be acute versus chronic. Please correlate with any more recent studies are follow up evaluation when appropriate Electronically Signed   By: Karen Kays M.D.   On: 03/07/2023 13:24   MR CERVICAL SPINE WO CONTRAST  Result Date: 02/28/2023 CLINICAL DATA:  Chronic neck pain extending down the right side EXAM: MRI CERVICAL SPINE WITHOUT CONTRAST TECHNIQUE: Multiplanar, multisequence MR imaging of the cervical spine was performed. No intravenous contrast was administered. COMPARISON:  12/07/2012 FINDINGS: Alignment: Physiologic. Vertebrae: No fracture, evidence of discitis, or bone lesion. C5-6 and C6-7 ACDF with solid arthrodesis. History of rheumatoid arthritis. No inflammatory arthropathy. Cord: No abnormal morphology or signal Posterior Fossa, vertebral arteries,  paraspinal tissues:  Negative Disc levels: C2-3: Mild facet spurring on the left C3-4: Degenerative facet spurring which is moderate to bulky on the left. Small left uncovertebral spur. There is mild left foraminal narrowing. Mild cord crowding by ligamentous thickening and disc bulging. C4-5: Disc narrowing and bulging with uncovertebral and facet spurring eccentric to the right. Right foraminal narrowing is mild based on axial images. Ligamentum flavum thickening and disc bulging mildly crowds the cord. C5-6: ACDF with solid arthrodesis and no impingement C6-7: ACDF with solid arthrodesis and no impingement C7-T1:Asymmetric left facet spurring and uncovertebral ridging. Moderate left foraminal narrowing. Patent spinal canal. IMPRESSION: 1. Mild progression of adjacent segment degeneration when compared to 2014. The most notable change is new moderate left foraminal narrowing at C7-T1. 2. On the symptomatic right side there is up to mild foraminal narrowing at C4-5. 3. Diffusely patent spinal canal. 4. C5-6 and C6-7 ACDF with solid arthrodesis. Electronically Signed   By: Tiburcio Pea M.D.   On: 02/28/2023 10:24   (Echo, Carotid, EGD, Colonoscopy, ERCP)    Subjective: Pt denies any complaints. Pt denies any chest pain or shortness of breath    Discharge Exam: Vitals:   03/12/23 0820 03/12/23 1235  BP: (!) 144/72 127/74  Pulse: (!) 57 64  Resp: 16 20  Temp:  98.3 F (36.8 C)  SpO2: 100% 96%   Vitals:   03/11/23 2335 03/12/23 0407 03/12/23 0820 03/12/23 1235  BP: 136/66 131/65 (!) 144/72 127/74  Pulse: 64 (!) 59 (!) 57 64  Resp: 18 18 16 20   Temp: 98.6 F (37 C) 98.1 F (36.7 C)  98.3 F (36.8 C)  TempSrc: Oral     SpO2: 99% 98% 100% 96%  Weight:  61.4 kg    Height:        General: Pt is alert, awake, not in acute distress Cardiovascular:  S1/S2 +, no rubs, no gallops Respiratory: CTA bilaterally, no wheezing, no rhonchi Abdominal: Soft, NT, ND, bowel sounds + Extremities: no edema, no  cyanosis    The results of significant diagnostics from this hospitalization (including imaging, microbiology, ancillary and laboratory) are listed below for reference.     Microbiology: No results found for this or any previous visit (from the past 240 hour(s)).   Labs: BNP (last 3 results) No results for input(s): "BNP" in the last 8760 hours. Basic Metabolic Panel: Recent Labs  Lab 03/07/23 1248 03/08/23 0552 03/11/23 0356 03/11/23 1853 03/12/23 0423  NA 137 136 139  --  139  K 4.3 4.4 3.8  --  4.8  CL 102 107 106  --  102  CO2 26 23 28   --  29  GLUCOSE 146* 135* 109*  --  100*  BUN 18 16 19   --  14  CREATININE 0.91 0.75 0.91  --  0.85  CALCIUM 9.2 8.9 8.3*  --  8.8*  MG  --   --   --  2.1  --    Liver Function Tests: Recent Labs  Lab 03/08/23 0552  AST 19  ALT 19  ALKPHOS 59  BILITOT 0.4  PROT 6.7  ALBUMIN 3.7   No results for input(s): "LIPASE", "AMYLASE" in the last 168 hours. No results for input(s): "AMMONIA" in the last 168 hours. CBC: Recent Labs  Lab 03/07/23 1248 03/08/23 0552 03/11/23 0356 03/12/23 0423  WBC 11.0* 10.6* 9.4 11.1*  HGB 12.5 12.0 11.6* 11.8*  HCT 38.9 37.2 35.8* 35.7*  MCV 99.7 98.7 100.3* 96.7  PLT 488* 465* 394 402*   Cardiac Enzymes: No results for input(s): "CKTOTAL", "CKMB", "CKMBINDEX", "TROPONINI" in the last 168 hours. BNP: Invalid input(s): "POCBNP" CBG: No results for input(s): "GLUCAP" in the last 168 hours. D-Dimer No results for input(s): "DDIMER" in the last 72 hours. Hgb A1c No results for input(s): "HGBA1C" in the last 72 hours. Lipid Profile No results for input(s): "CHOL", "HDL", "LDLCALC", "TRIG", "CHOLHDL", "LDLDIRECT" in the last 72 hours. Thyroid function studies No results for input(s): "TSH", "T4TOTAL", "T3FREE", "THYROIDAB" in the last 72 hours.  Invalid input(s): "FREET3" Anemia work up No results for input(s): "VITAMINB12", "FOLATE", "FERRITIN", "TIBC", "IRON", "RETICCTPCT" in the last 72  hours. Urinalysis    Component Value Date/Time   APPEARANCEUR Clear 02/27/2017 1051   GLUCOSEU Negative 02/27/2017 1051   BILIRUBINUR Negative 02/27/2017 1051   PROTEINUR Negative 02/27/2017 1051   UROBILINOGEN 0.2 12/30/2016 1450   NITRITE Negative 02/27/2017 1051   LEUKOCYTESUR Negative 02/27/2017 1051   Sepsis Labs Recent Labs  Lab 03/07/23 1248 03/08/23 0552 03/11/23 0356 03/12/23 0423  WBC 11.0* 10.6* 9.4 11.1*   Microbiology No results found for this or any previous visit (from the past 240 hour(s)).   Time coordinating discharge: Over 30 minutes  SIGNED:   Charise Killian, MD  [facility] 03/12/2023, 1:30 PM Pager   If 7PM-7AM, please contact night-coverage [email or web address]

## 2023-03-12 NOTE — Progress Notes (Signed)
Cardiology Progress Note   Patient Name: Meredith Russell Date of Encounter: 03/12/2023  Primary Cardiologist: Yvonne Kendall, MD  Subjective   No c/p or dyspnea.  Ambulating w/o difficulty.  Inpatient Medications    Scheduled Meds:  amLODipine  10 mg Oral Daily   ascorbic acid  500 mg Oral Daily   aspirin EC  81 mg Oral Daily   cholecalciferol  1,000 Units Oral Daily   citalopram  20 mg Oral Daily   cyanocobalamin  1,000 mcg Oral Daily   enoxaparin (LOVENOX) injection  40 mg Subcutaneous Q24H   fluticasone  2 spray Each Nare Daily   folic acid  1 mg Oral Daily   loratadine  10 mg Oral Daily   predniSONE  10 mg Oral Q breakfast   Followed by   Melene Muller ON 03/14/2023] predniSONE  5 mg Oral Q breakfast   rosuvastatin  10 mg Oral Daily   sodium chloride flush  3 mL Intravenous Q12H   ticagrelor  90 mg Oral BID   Continuous Infusions:  sodium chloride     PRN Meds: sodium chloride, acetaminophen, alum & mag hydroxide-simeth, calcipotriene, guaiFENesin-dextromethorphan, HYDROmorphone (DILAUDID) injection, LORazepam, magnesium hydroxide, mupirocin ointment, nitroGLYCERIN, ondansetron (ZOFRAN) IV, oxyCODONE-acetaminophen, phenylephrine-shark liver oil-mineral oil-petrolatum, sodium chloride flush, tiZANidine, traZODone, zolpidem   Vital Signs    Vitals:   03/11/23 1948 03/11/23 2335 03/12/23 0407 03/12/23 0820  BP: (!) 146/73 136/66 131/65 (!) 144/72  Pulse: 67 64 (!) 59 (!) 57  Resp: 18 18 18 16   Temp: 98.7 F (37.1 C) 98.6 F (37 C) 98.1 F (36.7 C)   TempSrc:  Oral    SpO2: 98% 99% 98% 100%  Weight:   61.4 kg   Height:        Intake/Output Summary (Last 24 hours) at 03/12/2023 1142 Last data filed at 03/11/2023 1500 Gross per 24 hour  Intake 781.37 ml  Output --  Net 781.37 ml   Filed Weights   03/07/23 1550 03/12/23 0407  Weight: 63.5 kg 61.4 kg    Physical Exam   GEN: Well nourished, well developed, in no acute distress.  HEENT: Grossly normal.  Neck:  Supple, no JVD, carotid bruits, or masses. Cardiac: RRR, no murmurs, rubs, or gallops. No clubbing, cyanosis, edema.  Radials 2+, DP/PT 2+ and equal bilaterally.  R radial cath site w/o bleeding/bruit/hematoma. Respiratory:  Respirations regular and unlabored, clear to auscultation bilaterally. GI: Soft, nontender, nondistended, BS + x 4. MS: no deformity or atrophy. Skin: warm and dry, no rash. Neuro:  Strength and sensation are intact. Psych: AAOx3.  Normal affect.  Labs    Chemistry Recent Labs  Lab 03/08/23 0552 03/11/23 0356 03/12/23 0423  NA 136 139 139  K 4.4 3.8 4.8  CL 107 106 102  CO2 23 28 29   GLUCOSE 135* 109* 100*  BUN 16 19 14   CREATININE 0.75 0.91 0.85  CALCIUM 8.9 8.3* 8.8*  PROT 6.7  --   --   ALBUMIN 3.7  --   --   AST 19  --   --   ALT 19  --   --   ALKPHOS 59  --   --   BILITOT 0.4  --   --   GFRNONAA >60 >60 >60  ANIONGAP 6 5 8      Hematology Recent Labs  Lab 03/08/23 0552 03/11/23 0356 03/12/23 0423  WBC 10.6* 9.4 11.1*  RBC 3.77* 3.57* 3.69*  HGB 12.0 11.6* 11.8*  HCT 37.2  35.8* 35.7*  MCV 98.7 100.3* 96.7  MCH 31.8 32.5 32.0  MCHC 32.3 32.4 33.1  RDW 12.5 12.6 12.7  PLT 465* 394 402*    Cardiac Enzymes  Recent Labs  Lab 03/07/23 1248 03/07/23 1512 03/07/23 1943 03/11/23 1853 03/11/23 2109  TROPONINIHS 46* 46* 36* 607* 693*     Lipids  Lab Results  Component Value Date   CHOL 190 03/08/2023   HDL 89 03/08/2023   LDLCALC 89 03/08/2023   TRIG 62 03/08/2023   CHOLHDL 2.1 03/08/2023    HbA1c  Lab Results  Component Value Date   HGBA1C 6.0 (H) 03/08/2023   Radiology    -------------  Telemetry    RSR - Personally Reviewed  Cardiac Studies   2D Echocardiogram 8.31.2024   1. Left ventricular ejection fraction, by estimation, is 60 to 65%. The  left ventricle has normal function. The left ventricle has no regional  wall motion abnormalities. Left ventricular diastolic parameters were  normal. The average left  ventricular  global longitudinal strain is -18.9 %. The global longitudinal strain is  normal.   2. Right ventricular systolic function is normal. The right ventricular  size is normal. There is normal pulmonary artery systolic pressure.   3. The mitral valve is normal in structure. Trivial mitral valve  regurgitation. No evidence of mitral stenosis.   4. The aortic valve is tricuspid. There is mild calcification of the  aortic valve. There is mild thickening of the aortic valve. Aortic valve  regurgitation is trivial. No aortic stenosis is present.   5. The inferior vena cava is normal in size with greater than 50%  respiratory variability, suggesting right atrial pressure of 3 mmHg.  _____________   Cardiac Catheterization and Percutaneous Coronary Intervention 9.3.2024  Diagnostic Dominance: Right  Intervention    Conclusions: Severe multivessel coronary artery disease, as detailed below.  This includes 95% mid LAD stenosis as well as chronic total occlusions of small D1 and OM 2 branches.  There is moderate multifocal LCx/OM disease; the 70% mid LCx stenosis is not hemodynamically significant by flow assessment (RFR = 0.93).  RCA demonstrates mild to moderate, nonobstructive disease. 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 Normal left ventricular filling pressure (LVEDP 13 mmHg). Successful PCI to mid LAD using Onyx Frontier 2.75 x 18 mm drug-eluting stent with 0% residual stenosis and TIMI-3 flow. Tortuous right brachial and innominate arteries; consider use of a long sheath or alternative access if future catheterizations are needed.   Recommendations: Complete 2 hours of cangrelor infusion. Dual antiplatelet therapy with aspirin and ticagrelor for at least 12 months.  If symptomatic bradycardia is an issue, the patient could be transitioned to clopidogrel. Aggressive secondary prevention of coronary artery disease.  Favor medical  therapy of LCx/OM/D1 disease.   Yvonne Kendall, MD [facility] _____________   Patient Profile     72 y.o. female w/ a h/o HTN, HL, RA, and psoriatric arthritis, who was admitted 8/31 w/ NSTEMI, and is now s/p PCI/DES to the LAD.  Assessment & Plan    1.  NSTEMI/CAD:  s/p PCI/DES of the LAD on 9/3.  Residual small/branch vessel dzs w/ L  L and R  L collaterals.  No c/p this AM.  Ambulating w/o difficulty.  Ok for d/c home today.  Cont asa, statin, brilinta.  2.  Primary HTN:  Improved after AM amlodipine dose - continue.  3.  HL:  Cont rosuva 10  mg.  Lesser potency due to concern re: potential hepatotoxicity w/ RA therapies.  4.  RA/Psoriatic arthritis:  prednisone and pain mgmt per primary team and outpt rheumatologist.  Signed, Nicolasa Ducking, NP  03/12/2023, 11:42 AM    For questions or updates, please contact   Please consult [email or web address] for contact info under Cardiology/STEMI.

## 2023-03-12 NOTE — TOC Progression Note (Signed)
Transition of Care [facility]) - Progression Note    Patient Details  Name: Meredith Russell MRN: [id number] Date of Birth: 13-Dec-1950  [facility] CM/SW Contact  Truddie Hidden, RN Phone Number: 03/12/2023, 10:28 AM  Clinical Narrative:    TOC continuing to follow patent's progress throughout discharge planning.         Expected Discharge Plan and Services                                               Social Determinants of Health (SDOH) Interventions SDOH Screenings   Food Insecurity: No Food Insecurity (03/07/2023)  Housing: Low Risk  (03/07/2023)  Transportation Needs: No Transportation Needs (03/07/2023)  Utilities: Not At Risk (03/07/2023)  Alcohol Screen: Low Risk  (07/22/2022)  Depression (PHQ2-9): Medium Risk (11/04/2022)  Financial Resource Strain: Patient Declined (11/01/2022)  Physical Activity: Inactive (03/04/2019)  Social Connections: Unknown (03/04/2019)  Stress: Stress Concern Present (03/04/2019)  Tobacco Use: Medium Risk (03/07/2023)    Readmission Risk Interventions     No data to display

## 2023-03-12 NOTE — Progress Notes (Signed)
Patient discharging home. All DC paperwork reviewed and sent with pt. Pt left with all personal belongings with spouse via private vehicle

## 2023-03-12 NOTE — Plan of Care (Signed)
  Problem: Education: Goal: Understanding of cardiac disease, CV risk reduction, and recovery process will improve Outcome: Adequate for Discharge Goal: Individualized Educational Video(s) Outcome: Adequate for Discharge   Problem: Activity: Goal: Ability to tolerate increased activity will improve Outcome: Adequate for Discharge   Problem: Cardiac: Goal: Ability to achieve and maintain adequate cardiovascular perfusion will improve Outcome: Adequate for Discharge   Problem: Health Behavior/Discharge Planning: Goal: Ability to safely manage health-related needs after discharge will improve Outcome: Adequate for Discharge   Problem: Education: Goal: Understanding of CV disease, CV risk reduction, and recovery process will improve Outcome: Adequate for Discharge Goal: Individualized Educational Video(s) Outcome: Adequate for Discharge   Problem: Activity: Goal: Ability to return to baseline activity level will improve Outcome: Adequate for Discharge   Problem: Cardiovascular: Goal: Ability to achieve and maintain adequate cardiovascular perfusion will improve Outcome: Adequate for Discharge Goal: Vascular access site(s) Level 0-1 will be maintained Outcome: Adequate for Discharge   Problem: Health Behavior/Discharge Planning: Goal: Ability to safely manage health-related needs after discharge will improve Outcome: Adequate for Discharge   Problem: Education: Goal: Knowledge of General Education information will improve Description: Including pain rating scale, medication(s)/side effects and non-pharmacologic comfort measures Outcome: Adequate for Discharge   Problem: Health Behavior/Discharge Planning: Goal: Ability to manage health-related needs will improve Outcome: Adequate for Discharge   Problem: Clinical Measurements: Goal: Ability to maintain clinical measurements within normal limits will improve Outcome: Adequate for Discharge Goal: Will remain free from  infection Outcome: Adequate for Discharge Goal: Diagnostic test results will improve Outcome: Adequate for Discharge Goal: Respiratory complications will improve Outcome: Adequate for Discharge Goal: Cardiovascular complication will be avoided Outcome: Adequate for Discharge   Problem: Activity: Goal: Risk for activity intolerance will decrease Outcome: Adequate for Discharge   Problem: Nutrition: Goal: Adequate nutrition will be maintained Outcome: Adequate for Discharge   Problem: Coping: Goal: Level of anxiety will decrease Outcome: Adequate for Discharge   Problem: Elimination: Goal: Will not experience complications related to bowel motility Outcome: Adequate for Discharge Goal: Will not experience complications related to urinary retention Outcome: Adequate for Discharge   Problem: Pain Managment: Goal: General experience of comfort will improve Outcome: Adequate for Discharge   Problem: Safety: Goal: Ability to remain free from injury will improve Outcome: Adequate for Discharge   Problem: Skin Integrity: Goal: Risk for impaired skin integrity will decrease Outcome: Adequate for Discharge   Problem: Education: Goal: Understanding of CV disease, CV risk reduction, and recovery process will improve Outcome: Adequate for Discharge Goal: Individualized Educational Video(s) Outcome: Adequate for Discharge   Problem: Activity: Goal: Ability to return to baseline activity level will improve Outcome: Adequate for Discharge   Problem: Cardiovascular: Goal: Ability to achieve and maintain adequate cardiovascular perfusion will improve Outcome: Adequate for Discharge Goal: Vascular access site(s) Level 0-1 will be maintained Outcome: Adequate for Discharge   Problem: Health Behavior/Discharge Planning: Goal: Ability to safely manage health-related needs after discharge will improve Outcome: Adequate for Discharge

## 2023-03-13 ENCOUNTER — Telehealth: Payer: Self-pay

## 2023-03-13 LAB — LIPOPROTEIN A (LPA): Lipoprotein (a): 11.2 nmol/L (ref <75.0)

## 2023-03-13 NOTE — Transitions of Care (Post Inpatient/ED Visit) (Signed)
03/13/2023  Name: Meredith Russell MRN: [id number] DOB: Oct 08, 1950  Today's TOC FU Call Status: Today's TOC FU Call Status:: Successful TOC FU Call Completed TOC FU Call Complete Date: 03/13/23 Patient's Name and Date of Birth confirmed.  Transition Care Management Follow-up Telephone Call Date of Discharge: 03/12/23 Discharge Facility: [facility]) Type of Discharge: Inpatient Admission Primary Inpatient Discharge Diagnosis:: Chest Pain How have you been since you were released from the hospital?: Same Any questions or concerns?: No  Items Reviewed: Did you receive and understand the discharge instructions provided?: Yes Medications obtained,verified, and reconciled?: Yes (Medications Reviewed) Any new allergies since your discharge?: No Dietary orders reviewed?: NA Do you have support at home?: Yes People in Home: spouse  Medications Reviewed Today: Medications Reviewed Today     Reviewed by Anthoney Harada, LPN (Licensed Practical Nurse) on 03/13/23 at 1423  Med List Status: <None>   Medication Order Taking? Sig Documenting Provider Last Dose Status Informant  amLODipine (NORVASC) 10 MG tablet [id number] Yes Take 1 tablet (10 mg total) by mouth daily. Charise Killian, MD Taking Active   aspirin EC 81 MG tablet [id number] Yes Take 1 tablet (81 mg total) by mouth daily. Swallow whole. Charise Killian, MD Taking Active   butalbital-acetaminophen-caffeine (FIORICET) [phone] MG tablet [id number] Yes TAKE 1-2 TABLET BY MOUTH EVERY 6 (SIX) HOURS AS NEEDED FOR HEADACHE. Malva Limes, MD Taking Active Self  calcipotriene (DOVONOX) 0.005 % cream [id number] Yes Apply 1 application  topically 3 (three) times daily as needed (psoriasis). [provider] Taking Active Self  Cholecalciferol (VITAMIN D3) 1000 units CAPS [id number] Yes Take 1,000 Units by mouth daily.  [provider] Taking Active Self  citalopram (CELEXA) 20 MG tablet  [id number] Yes Take 1 tablet (20 mg total) by mouth daily. Malva Limes, MD Taking Active Self  diclofenac Sodium (VOLTAREN) 1 % GEL [id number] Yes Apply topically. [provider] Taking Active Self  fexofenadine (ALLEGRA) 180 MG tablet [id number] Yes TAKE 1 TABLET BY MOUTH EVERY DAY Malva Limes, MD Taking Active Self  fluticasone (FLONASE) 50 MCG/ACT nasal spray [id number] Yes USE 2 SPRAYS IN BOTH NOSTRILS  DAILY AS NEEDED FOR ALLERGIES Malva Limes, MD Taking Active Self  folic acid (FOLVITE) 1 MG tablet [id number] Yes Take 1 mg by mouth daily. [provider] Taking Active Self  halobetasol (ULTRAVATE) 0.05 % ointment [id number] Yes Apply 1 application topically daily as needed (psoriasis).  [provider] Taking Active Self  LORazepam (ATIVAN) 1 MG tablet [id number] Yes TAKE ONE-HALF TO ONE TABLET BY MOUTH TWICE DAILY AS NEEDED FOR ANXIETY Malva Limes, MD Taking Active Self  mupirocin ointment (BACTROBAN) 2 % [id number] Yes Apply 1 Application topically daily. [place], [place], MD Taking Active Self  oxyCODONE-acetaminophen (PERCOCET) 7.5-325 MG per tablet [id number] Yes Take 1 tablet by mouth every 6 (six) hours as needed for severe pain.  [provider] Taking Active Self           Med Note Larose Hires, ANNA E   Mon Sep 14, 2018 12:34 PM)    pramoxine-hydrocortisone [facility]) 1-1 % rectal cream [id number] Yes Place 1 application rectally 2 (two) times daily.  Patient taking differently: Place 1 application  rectally 2 (two) times daily as needed for hemorrhoids.   Malva Limes, MD Taking Active Self  predniSONE (DELTASONE) 5 MG tablet [id number] Yes Take 5-20 mg by mouth as directed. Take 4 tablets (20 mg total) by mouth once  daily for 3 days, THEN 3 tablets (15 mg total) once daily for 3 days, THEN 2 tablets (10 mg total) once daily for 3 days, THEN 1 tablet (5 mg total) once daily for 3 days. Take in the mornings.. [provider] Taking  Active Self  rosuvastatin (CRESTOR) 10 MG tablet [id number] Yes Take 1 tablet (10 mg total) by mouth daily. Charise Killian, MD Taking Active   ticagrelor [facility]) 90 MG TABS tablet [id number] Yes Take 1 tablet (90 mg total) by mouth 2 (two) times daily. Charise Killian, MD Taking Active   tiZANidine (ZANAFLEX) 2 MG tablet [id number] Yes Take 2 mg by mouth 3 (three) times daily. [provider] Taking Active Self  triamcinolone cream (KENALOG) 0.5 % [id number] Yes Apply 1 application topically 2 (two) times daily. As needed [provider] Taking Active Self  vitamin B-12 (CYANOCOBALAMIN) 1000 MCG tablet [id number] Yes Take 1,000 mcg by mouth daily.  [provider] Taking Active Self  vitamin C (ASCORBIC ACID) 500 MG tablet [id number] Yes Take 500 mg by mouth daily.  [provider] Taking Active Self           Med Note Larose Hires, ANNA E   Mon Sep 14, 2018 12:30 PM)    zolpidem (AMBIEN) 10 MG tablet [id number] Yes TAKE ONE TABLET BY MOUTH ONE TIME DAILY AT BEDTIME Malva Limes, MD Taking Active Self            Home Care and Equipment/Supplies: Were Home Health Services Ordered?: No Any new equipment or medical supplies ordered?: No  Functional Questionnaire: Do you need assistance with bathing/showering or dressing?: No Do you need assistance with meal preparation?: No Do you need assistance with eating?: No Do you have difficulty maintaining continence: No Do you need assistance with getting out of bed/getting out of a chair/moving?: No Do you have difficulty managing or taking your medications?: No  Follow up appointments reviewed: PCP Follow-up appointment confirmed?: Yes Date of PCP follow-up appointment?: 03/25/23 Follow-up Provider: Dr. Sherrie Mustache Specialist [facility] Follow-up appointment confirmed?: No Reason Specialist Follow-Up Not Confirmed: Patient has Specialist Provider Number and will Call for Appointment Do you need transportation  to your follow-up appointment?: No Do you understand care options if your condition(s) worsen?: Yes-patient verbalized understanding    SIGNATURE Kandis Fantasia, LPN [facility] Health Advisor [place] [facility] You Are. We Are. [facility] Direct Dial [phone]

## 2023-03-13 NOTE — Telephone Encounter (Signed)
Patient is scheduled for physical on 9/17 and would rather not schedule an additional appointment. Also would like referral to [facility] Cardiology which she wants to discuss further at office visit.

## 2023-03-16 ENCOUNTER — Other Ambulatory Visit: Payer: Self-pay | Admitting: Family Medicine

## 2023-03-16 DIAGNOSIS — I10 Essential (primary) hypertension: Secondary | ICD-10-CM

## 2023-03-20 ENCOUNTER — Ambulatory Visit: Payer: Medicare Other | Attending: Cardiology | Admitting: Cardiology

## 2023-03-20 ENCOUNTER — Encounter: Payer: Self-pay | Admitting: Cardiology

## 2023-03-20 VITALS — BP 138/72 | HR 80 | Ht 64.0 in | Wt 136.0 lb

## 2023-03-20 DIAGNOSIS — I2089 Other forms of angina pectoris: Secondary | ICD-10-CM | POA: Diagnosis not present

## 2023-03-20 DIAGNOSIS — I679 Cerebrovascular disease, unspecified: Secondary | ICD-10-CM

## 2023-03-20 DIAGNOSIS — E782 Mixed hyperlipidemia: Secondary | ICD-10-CM | POA: Diagnosis not present

## 2023-03-20 DIAGNOSIS — M069 Rheumatoid arthritis, unspecified: Secondary | ICD-10-CM

## 2023-03-20 DIAGNOSIS — L4050 Arthropathic psoriasis, unspecified: Secondary | ICD-10-CM

## 2023-03-20 DIAGNOSIS — I10 Essential (primary) hypertension: Secondary | ICD-10-CM

## 2023-03-20 DIAGNOSIS — I214 Non-ST elevation (NSTEMI) myocardial infarction: Secondary | ICD-10-CM

## 2023-03-20 DIAGNOSIS — I25118 Atherosclerotic heart disease of native coronary artery with other forms of angina pectoris: Secondary | ICD-10-CM

## 2023-03-20 DIAGNOSIS — I2510 Atherosclerotic heart disease of native coronary artery without angina pectoris: Secondary | ICD-10-CM

## 2023-03-20 NOTE — Progress Notes (Signed)
Cardiology Office Note:  .   Date:  03/21/2023  ID:  Meredith Russell, DOB 14-Jul-1950, MRN [id number] PCP: Malva Limes, MD  [facility] Providers Cardiologist:  Yvonne Kendall, MD    History of Present Illness: .   Meredith Russell is a 72 y.o. female with a past medical history of hypertension, rheumatoid arthritis, psoriatic arthritis, Raynaud's, migraines, GERD, who was recently hospitalized and evaluated for chest pain concerning for unstable angina, who is here today for hospital follow-up.  [name] no prior cardiovascular history but showed a longstanding history of rheumatoid arthritis and psoriatic arthritis with Raynaud's disease and previously been followed at [facility].  She was also former smoker.  She also noted that her family are strong family history of early onset coronary artery disease.  She had presented to the [facility] emergency department and being evaluated in [facility] urgent care for the complaint of chest pain that has been intermittent for the last 4 months.  She described the chest pain as heaviness and tightness that was substernal and radiated up into her neck and jaw with exertion that resolved with rest after 1 to 2 minutes.  She has an associated mild nausea.  With her rheumatoid arthritis multiple medication changes have been made she was unsure whether the discomfort was cardiac related to her arthritis.  Initial vital signs were blood pressure 145/76, pulse 65, respiration 18, 98% on room air.  Labs were pertinent for sodium 137, potassium 4 with her, glucose 126, BUN of 18, serum creatinine 0.91, WBCs 11.7, hemoglobin 12.5, hematocrit 38.9, and high-sensitivity troponin of 46 x 2.  Echocardiogram completed revealed LVEF of 60 to 65%, no RWMA, trivial mitral regurgitation.  She underwent cardiac catheterization on 03/11/2023 which revealed severe multivessel coronary artery disease that included 95% mid LAD stenosis as well as chronic total occlusions of small D1 and OM 2  branches.  There was moderate multifocal left circumflex/OM disease and 70% mid left circumflex stenosis that was not hemodynamically significant by flow assessment with RFR 0.93 RCA demonstrated mild to moderate nonobstructive disease.  She underwent successful PCI/DES to the mid LAD.  She was continued on dual antiplatelet therapy for 12 months is symptomatic bradycardia became an issue she could be transitioned to clopidogrel.  She was also advised to continue secondary prevention of coronary artery disease favoring medical therapy of left circumflex/OM/D1.  She was considered stable for discharge but subsequently discharged in the facility on 03/12/23  She returns to clinic today stating that overall she has been doing well.  She has not had any further discomfort in her chest or upper neck and shoulders like she did on arrival to the emergency department.  She has several questions today and noted bulk related to when she can get back to her regular activity.  Prior to coming in and undergoing left heart catheterization she was cutting down trees and hauling wood and was cleaning gutters on the roof of the house.  She denies any chest pain, shortness of breath, palpitations.  She has been compliant with all of her medications.  She has been walking at home without any incidents of angina or anginal equivalents.  She denies any recurrent hospitalizations or recent visits to the emergency department.  ROS: 10 point review of system has been reviewed and considered negative with exception of what is been listed in the HPI  Studies Reviewed: Marland Kitchen   EKG Interpretation Date/Time:  Thursday March 20 2023 15:17:53 EDT Ventricular Rate:  80 PR Interval:  118 QRS Duration:  76 QT Interval:  378 QTC Calculation: 435 R Axis:   -4  Text Interpretation: Normal sinus rhythm Normal ECG When compared with ECG of 12-Mar-2023 05:14, Confirmed by Charlsie Quest ([id number]) on 03/20/2023 3:25:30 PM  [facility] 03/11/23   Conclusions: Severe multivessel coronary artery disease, as detailed below.  This includes 95% mid LAD stenosis as well as chronic total occlusions of small D1 and OM 2 branches.  There is moderate multifocal LCx/OM disease; the 70% mid LCx stenosis is not hemodynamically significant by flow assessment (RFR = 0.93).  RCA demonstrates mild to moderate, nonobstructive disease. 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 Normal left ventricular filling pressure (LVEDP 13 mmHg). Successful PCI to mid LAD using Onyx Frontier 2.75 x 18 mm drug-eluting stent with 0% residual stenosis and TIMI-3 flow. Tortuous right brachial and innominate arteries; consider use of a long sheath or alternative access if future catheterizations are needed.   Recommendations: Complete 2 hours of cangrelor infusion. Dual antiplatelet therapy with aspirin and ticagrelor for at least 12 months.  If symptomatic bradycardia is an issue, the patient could be transitioned to clopidogrel. Aggressive secondary prevention of coronary artery disease.  Favor medical therapy of LCx/OM/D1 disease.  TTE 03/08/23 1. Left ventricular ejection fraction, by estimation, is 60 to 65%. The  left ventricle has normal function. The left ventricle has no regional  wall motion abnormalities. Left ventricular diastolic parameters were  normal. The average left ventricular  global longitudinal strain is -18.9 %. The global longitudinal strain is  normal.   2. Right ventricular systolic function is normal. The right ventricular  size is normal. There is normal pulmonary artery systolic pressure.   3. The mitral valve is normal in structure. Trivial mitral valve  regurgitation. No evidence of mitral stenosis.   4. The aortic valve is tricuspid. There is mild calcification of the  aortic valve. There is mild thickening of the aortic valve. Aortic valve  regurgitation is trivial. No aortic stenosis is  present.   5. The inferior vena cava is normal in size with greater than 50%  respiratory variability, suggesting right atrial pressure of 3 mmHg.  Risk Assessment/Calculations:             Physical Exam:   VS:  BP 138/72 (BP Location: Left Arm, Patient Position: Sitting, Cuff Size: Normal)   Pulse 80   Ht 5\' 4"  (1.626 m)   Wt 136 lb (61.7 kg)   SpO2 99%   BMI 23.34 kg/m    Wt Readings from Last 3 Encounters:  03/20/23 136 lb (61.7 kg)  03/12/23 135 lb 4.8 oz (61.4 kg)  11/04/22 140 lb (63.5 kg)    GEN: Well nourished, well developed in no acute distress NECK: No JVD; No carotid bruits CARDIAC: RRR, no murmurs, rubs, gallops RESPIRATORY:  Clear to auscultation without rales, wheezing or rhonchi  ABDOMEN: Soft, non-tender, non-distended EXTREMITIES:  No edema; No deformity   ASSESSMENT AND PLAN: .   Coronary artery disease with recent PCI really/DES to the mid LAD on 03/11/2023.  Patient's left heart catheterization revealed severe multivessel coronary artery disease that includes 95% mid LAD stenosis as well as chronic total occlusions of small D1 and OM 2 branches.  There is moderate multifocal left circumflex/OM disease, 70% mid left circumflex stenosis is not hemodynamically significant by flow assessment (RFR = 0.93).  RCA demonstrates mild to moderate nonobstructive disease.  Torturous right brachial and nondominant arteries considering use of a long sheath or alternative access if future catheterizations are needed.  Patient is continued on DAPT with aspirin and Brilinta for minimum of 12 months continue rosuvastatin 10 mg daily for secondary prevention.  EKG today reveals sinus rhythm with a rate of 80 without ischemic changes noted.  She is being referred to cardiac rehab.  She is having a CBC and a BMP repeated today postprocedure to reevaluate kidney function after left heart catheterization.  Echocardiogram completed during recent hospitalization revealed LVEF of 60 to 65%, no  RWMA, trivial mitral regurgitation.  Mixed hyperlipidemia where she is continued on rosuvastatin 10 mg daily.  Patient on intensity statin therapy as LDL was reasonable without treatment with an LDL of 89.  There is concern about potential hepatotoxicity with multiple RA therapies.  Will need repeat lipid and hepatic panel in 8 to 10 weeks.  Primary hypertension with blood pressure 138/72.  Blood pressure remained stable.  She is continued on amlodipine 10 mg daily.  She has been encouraged to monitor blood pressures 1 to 2 hours post medication administration at home as well.  Rheumatoid and psoriatic arthritis where she is continued on oxycodone.  Ongoing management per primary team.  Previous episodes of bradycardia during hospitalization which prevented beta-blocker therapy.  She just started first infusion of a new medication for her heart rate this morning medication changes have been deferred at this time.    Cardiac Rehabilitation Eligibility Assessment  The patient is ready to start cardiac rehabilitation from a cardiac standpoint.       Dispo: Patient to return to clinic to see MD/APP in the next 6 to 8 weeks or sooner if needed  Signed, Antigone Crowell, NP

## 2023-03-20 NOTE — Patient Instructions (Signed)
Medication Instructions:  Your Physician recommend you continue on your current medication as directed.    *If you need a refill on your cardiac medications before your next appointment, please call your pharmacy*  Lab Work: Your provider would like for you to have following labs drawn today CBC and BMET.   Your provider would like for you to in the future have the following labs drawn: Fasting Lipid Panel.   Please go to [facility] [address] ([facility] [address], [place] [address] You do not need an appointment.  They are open from 7:30 am-4 pm.  Lunch from 1:00 pm- 2:00 pm You DO need to be fasting.  You may also go to any of these [facility] locations:  [employer]  - [address] [employer] [address] [employer] Officer)  If you have labs (blood work) drawn today and your tests are completely normal, you will receive your results only by: [employer] (if you have MyChart) OR A paper copy in the mail If you have any lab test that is abnormal or we need to change your treatment, we will call you to review the results.  Follow-Up: At [facility], you and your health needs are our priority.  As part of our continuing mission to provide you with exceptional heart care, we have created designated Provider Care Teams.  These Care Teams include your primary Cardiologist (physician) and Advanced Practice Providers (APPs -  Physician Assistants and Nurse Practitioners) who all work together to provide you with the care you need, when you need it.  We recommend signing up for the patient portal called "MyChart".  Sign up information is provided on this After Visit Summary.  MyChart is used to connect with patients for Virtual Visits (Telemedicine).  Patients are able to view lab/test results, encounter notes, upcoming appointments, etc.  Non-urgent messages can be sent to your provider as well.   To learn more about what you can do with MyChart, go to  [email or web address].    Your next appointment:   6 to 8 week(s)  Provider:   You may see Yvonne Kendall, MD or one of the following Advanced Practice Providers on your designated Care Team:   Nicolasa Ducking, NP Eula Listen, PA-C Cadence Fransico Michael, PA-C Charlsie Quest, NP   Other Instructions You have been referred to Cardiac Rehab

## 2023-03-21 ENCOUNTER — Other Ambulatory Visit: Payer: Self-pay

## 2023-03-21 DIAGNOSIS — Z79899 Other long term (current) drug therapy: Secondary | ICD-10-CM

## 2023-03-21 LAB — CBC
Hematocrit: 38.2 % (ref 34.0–46.6)
Hemoglobin: 12.8 g/dL (ref 11.1–15.9)
MCH: 33 pg (ref 26.6–33.0)
MCHC: 33.5 g/dL (ref 31.5–35.7)
MCV: 99 fL — ABNORMAL HIGH (ref 79–97)
Platelets: 312 10*3/uL (ref 150–450)
RBC: 3.88 x10E6/uL (ref 3.77–5.28)
RDW: 11.8 % (ref 11.7–15.4)
WBC: 10.9 10*3/uL — ABNORMAL HIGH (ref 3.4–10.8)

## 2023-03-21 LAB — BASIC METABOLIC PANEL
BUN/Creatinine Ratio: 20 (ref 12–28)
BUN: 21 mg/dL (ref 8–27)
CO2: 22 mmol/L (ref 20–29)
Calcium: 8.9 mg/dL (ref 8.7–10.3)
Chloride: 104 mmol/L (ref 96–106)
Creatinine, Ser: 1.04 mg/dL — ABNORMAL HIGH (ref 0.57–1.00)
Glucose: 133 mg/dL — ABNORMAL HIGH (ref 70–99)
Potassium: 4.3 mmol/L (ref 3.5–5.2)
Sodium: 141 mmol/L (ref 134–144)
eGFR: 57 mL/min/{1.73_m2} — ABNORMAL LOW (ref >59)

## 2023-03-21 NOTE — Progress Notes (Signed)
White count is improving, no concern for active infection.  Blood counts are stable.  Slight increase in kidney function.  Recommend increasing hydration.  Repeat BMP in 2 weeks.

## 2023-03-25 ENCOUNTER — Ambulatory Visit (INDEPENDENT_AMBULATORY_CARE_PROVIDER_SITE_OTHER): Payer: Medicare Other | Admitting: Family Medicine

## 2023-03-25 VITALS — BP 129/83 | HR 85 | Ht 64.0 in | Wt 134.7 lb

## 2023-03-25 DIAGNOSIS — E782 Mixed hyperlipidemia: Secondary | ICD-10-CM

## 2023-03-25 DIAGNOSIS — R252 Cramp and spasm: Secondary | ICD-10-CM

## 2023-03-25 DIAGNOSIS — M8580 Other specified disorders of bone density and structure, unspecified site: Secondary | ICD-10-CM | POA: Diagnosis not present

## 2023-03-25 DIAGNOSIS — I2510 Atherosclerotic heart disease of native coronary artery without angina pectoris: Secondary | ICD-10-CM | POA: Insufficient documentation

## 2023-03-25 DIAGNOSIS — I10 Essential (primary) hypertension: Secondary | ICD-10-CM

## 2023-03-25 DIAGNOSIS — I679 Cerebrovascular disease, unspecified: Secondary | ICD-10-CM

## 2023-03-25 DIAGNOSIS — Z0001 Encounter for general adult medical examination with abnormal findings: Secondary | ICD-10-CM | POA: Diagnosis not present

## 2023-03-25 DIAGNOSIS — J301 Allergic rhinitis due to pollen: Secondary | ICD-10-CM

## 2023-03-25 DIAGNOSIS — F439 Reaction to severe stress, unspecified: Secondary | ICD-10-CM

## 2023-03-25 DIAGNOSIS — Z0000 Encounter for general adult medical examination without abnormal findings: Secondary | ICD-10-CM

## 2023-03-25 DIAGNOSIS — I25118 Atherosclerotic heart disease of native coronary artery with other forms of angina pectoris: Secondary | ICD-10-CM

## 2023-03-25 DIAGNOSIS — G4700 Insomnia, unspecified: Secondary | ICD-10-CM

## 2023-03-25 MED ORDER — ZOLPIDEM TARTRATE 10 MG PO TABS
10.0000 mg | ORAL_TABLET | Freq: Every evening | ORAL | 1 refills | Status: DC | PRN
Start: 2023-03-25 — End: 2023-12-03

## 2023-03-25 MED ORDER — AZELASTINE HCL 0.1 % NA SOLN
2.0000 | Freq: Two times a day (BID) | NASAL | 5 refills | Status: DC
Start: 2023-03-25 — End: 2023-09-17

## 2023-03-25 MED ORDER — LORAZEPAM 1 MG PO TABS: ORAL_TABLET | ORAL | 1 refills | Status: AC

## 2023-03-25 NOTE — Progress Notes (Unsigned)
Annual Wellness Visit     Patient: Meredith Russell, Female    DOB: 01-08-1951, 72 y.o.   MRN: [id number] Visit Date: 03/25/2023  Today's Provider: Mila Merry, MD   Chief Complaint  Patient presents with   Annual Exam    Just got out of the hospital with 90 % blockage and a stint was put in    Subjective    Discussed the use of AI scribe software for clinical note transcription with the patient, who gave verbal consent to proceed.  History of Present Illness   The patient, with a history of rheumatoid arthritis, psoriatic arthritis, and Raynaud's disease presents today for routine annual physical. She was recently hospitalized for non-STEMI with PCTA. Since discharge, she has experienced episodes of tachycardia, which she had experienced prior to the hospitalization. She had a follow-up appointment with a cardiologist last week and noted.an increase in her creatinine from baseline,  which she attributes to decreased water intake due to a busy schedule. She has been advised to increase her water intake.  During her hospital stay, she was started on rosuvastatin and ticagrelo. Her amlodipine dosage, which was previously prescribed for Raynaud's, was increased to 10 mg.  The patient denies significant chest pain since discharge but reports discomfort in her wrists, which she attributes to overuse following the placement of a stent. She sought orthopedic care for this issue and received injections in both wrists.  She also reports difficulty taking deep breaths and has been diagnosed with hyperinflated lungs. She denies any history of asthma but reports a history of bronchitis in childhood and double pneumonia following a COVID-19 infection.  The patient has been experiencing leg and stomach cramps, which she has been managing with pickle juice and mustard. She also reports changes in bowel movements, which she attributes to pain medication taken during her hospital stay.  The patient  is currently on Ambien for sleep, Azelastine for sinus issues, and is requesting a prescription for lorazepam for occasional use on difficult days. She has discontinued her depression medication.     Lab Results  Component Value Date   NA 141 03/20/2023   K 4.3 03/20/2023   CREATININE 1.04 (H) 03/20/2023   EGFR 57 (L) 03/20/2023   GLUCOSE 133 (H) 03/20/2023      Medications: Outpatient Medications Prior to Visit  Medication Sig   amLODipine (NORVASC) 10 MG tablet Take 1 tablet (10 mg total) by mouth daily.   aspirin EC 81 MG tablet Take 1 tablet (81 mg total) by mouth daily. Swallow whole.   butalbital-acetaminophen-caffeine (FIORICET) 50-325-40 MG tablet TAKE 1-2 TABLET BY MOUTH EVERY 6 (SIX) HOURS AS NEEDED FOR HEADACHE.   calcipotriene (DOVONOX) 0.005 % cream Apply 1 application  topically 3 (three) times daily as needed (psoriasis).   Cholecalciferol (VITAMIN D3) 1000 units CAPS Take 1,000 Units by mouth daily.    cycloSPORINE (RESTASIS) 0.05 % ophthalmic emulsion Place 1 drop into both eyes 2 (two) times daily.   diclofenac Sodium (VOLTAREN) 1 % GEL Apply topically.   fexofenadine (ALLEGRA) 180 MG tablet TAKE 1 TABLET BY MOUTH EVERY DAY (Patient taking differently: Take 180 mg by mouth every other day.)   fluticasone (FLONASE) 50 MCG/ACT nasal spray USE 2 SPRAYS IN BOTH NOSTRILS  DAILY AS NEEDED FOR ALLERGIES   folic acid (FOLVITE) 1 MG tablet Take 1 mg by mouth daily.   halobetasol (ULTRAVATE) 0.05 % ointment Apply 1 application topically daily as needed (psoriasis).  leflunomide (ARAVA) 20 MG tablet Take 20 mg by mouth daily.   LORazepam (ATIVAN) 1 MG tablet TAKE ONE-HALF TO ONE TABLET BY MOUTH TWICE DAILY AS NEEDED FOR ANXIETY   mupirocin ointment (BACTROBAN) 2 % Apply 1 Application topically daily.   oxycodone-acetaminophen (LYNOX) 10-300 MG tablet Take 1 tablet by mouth 3 (three) times daily.   Povidone (IVIZIA DRY EYES OP) Apply 2 drops to eye 2 (two) times daily.    pramoxine-hydrocortisone (ANALPRAM-HC) 1-1 % rectal cream Place 1 application rectally 2 (two) times daily. (Patient taking differently: Place 1 application  rectally 2 (two) times daily as needed for hemorrhoids.)   promethazine (PHENERGAN) 25 MG tablet Take 1 tablet by mouth every 8 (eight) hours as needed.   rosuvastatin (CRESTOR) 10 MG tablet Take 1 tablet (10 mg total) by mouth daily.   ticagrelor (BRILINTA) 90 MG TABS tablet Take 1 tablet (90 mg total) by mouth 2 (two) times daily.   tiZANidine (ZANAFLEX) 2 MG tablet Take 2 mg by mouth 3 (three) times daily.   triamcinolone cream (KENALOG) 0.5 % Apply 1 application topically 2 (two) times daily. As needed   vitamin B-12 (CYANOCOBALAMIN) 1000 MCG tablet Take 1,000 mcg by mouth daily.   vitamin C (ASCORBIC ACID) 500 MG tablet Take 500 mg by mouth every other day.   zolpidem (AMBIEN) 10 MG tablet TAKE ONE TABLET BY MOUTH ONE TIME DAILY AT BEDTIME   citalopram (CELEXA) 20 MG tablet Take 1 tablet (20 mg total) by mouth daily. (Patient not taking: Reported on 03/25/2023)   No facility-administered medications prior to visit.    Allergies  Allergen Reactions   Adalimumab Itching and Rash    Psoriasis   Morphine Other (See Comments)    Tachycardia   Morphine And Codeine Other (See Comments)    Tachycardia   Pregabalin Other (See Comments)    Increased BP    Savella  [Milnacipran] Rash   Sulfa Antibiotics Nausea And Vomiting    Patient Care Team: Malva Limes, MD as PCP - General (Family Medicine) End, Cristal Deer, MD as PCP - Cardiology (Cardiology) Kandyce Rud., MD (Rheumatology) [facility], [place], MD (Inactive) as Consulting Physician (Dermatology) Merri Ray, MD as Referring Physician (Physical Medicine and Rehabilitation) Stanton Kidney, MD as Consulting Physician (Gastroenterology) Arman Bogus, MD as Consulting Physician (Neurosurgery)  Review of Systems  Constitutional:  Positive for fatigue.  Negative for chills, diaphoresis and fever.  HENT:  Positive for postnasal drip and rhinorrhea. Negative for congestion, ear discharge, ear pain, hearing loss, nosebleeds, sinus pressure, sinus pain, sore throat and tinnitus.   Eyes:  Negative for photophobia, pain, discharge and redness.  Respiratory:  Negative for cough, shortness of breath, wheezing and stridor.   Cardiovascular:  Negative for chest pain, palpitations and leg swelling.  Gastrointestinal:  Negative for abdominal pain, blood in stool, constipation, diarrhea, nausea and vomiting.  Endocrine: Negative for polydipsia.  Genitourinary:  Negative for dysuria, flank pain, frequency, hematuria and urgency.  Musculoskeletal:  Negative for back pain, myalgias and neck pain.  Skin:  Negative for rash.  Allergic/Immunologic: Negative for environmental allergies.  Neurological:  Negative for dizziness, tremors, seizures, weakness and headaches.  Hematological:  Does not bruise/bleed easily.  Psychiatric/Behavioral:  Negative for hallucinations and suicidal ideas. The patient is not nervous/anxious.     {Insert previous labs (optional):23779} {See past labs  Heme  Chem  Endocrine  Serology  Results Review (optional):1}    Objective    Vitals: BP 129/83 (  BP Location: Left Arm, Patient Position: Sitting, Cuff Size: Normal)   Pulse 85   Ht 5\' 4"  (1.626 m)   Wt 134 lb 11.2 oz (61.1 kg)   SpO2 100%   BMI 23.12 kg/m  {Insert last BP/Wt (optional):23777}{See vitals history (optional):1}  Physical Exam  General Appearance:    Well developed, well nourished female. Alert, cooperative, in no acute distress, appears stated age   Head:    Normocephalic, without obvious abnormality, atraumatic  Eyes:    PERRL, conjunctiva/corneas clear, EOM's intact, fundi    benign, both eyes  Ears:    Normal TM's and external ear canals, both ears  Nose:   Nares normal, septum midline,. Boggy turbinates with clear rhinorrhea.   Throat:   Lips,  mucosa, and tongue normal; teeth and gums normal  Neck:   Supple, symmetrical, trachea midline, no adenopathy;    thyroid:  no enlargement/tenderness/nodules; no carotid   bruit or JVD  Back:     Symmetric, no curvature, ROM normal, no CVA tenderness  Lungs:     Clear to auscultation bilaterally, respirations unlabored  Chest Wall:    No tenderness or deformity   Heart:    Normal heart rate. Normal rhythm. No murmurs, rubs, or gallops.   Breast Exam:    deferred  Abdomen:     Soft, non-tender, bowel sounds active all four quadrants,    no masses, no organomegaly  Pelvic:    deferred  Extremities:   All extremities are intact. No cyanosis or edema  Pulses:   2+ and symmetric all extremities  Skin:   Skin color, texture, turgor normal, no rashes or lesions  Lymph nodes:   Cervical, supraclavicular, and axillary nodes normal  Neurologic:   CNII-XII intact, normal strength, sensation and reflexes    throughout     Most recent fall risk assessment:    11/04/2022    2:26 PM  Fall Risk   Falls in the past year? 0  Number falls in past yr: 0  Injury with Fall? 0    Most recent depression screenings:    03/25/2023    9:14 AM 11/04/2022    2:26 PM  PHQ 2/9 Scores  PHQ - 2 Score 0 0  PHQ- 9 Score  5   Most recent cognitive screening:    03/21/2021    9:40 AM  6CIT Screen  What Year? 0 points  What month? 0 points  What time? 0 points  Count back from 20 0 points  Months in reverse 0 points  Repeat phrase 2 points  Total Score 2 points   Most recent Audit-C alcohol use screening    11/04/2022    2:26 PM  Alcohol Use Disorder Test (AUDIT)  1. How often do you have a drink containing alcohol? 0   A score of 3 or more in women, and 4 or more in men indicates increased risk for alcohol abuse, EXCEPT if all of the points are from question 1   No results found for any visits on 03/25/23.  Assessment & Plan     Annual wellness visit done today including the all of the  following: Reviewed patient's Family Medical History Reviewed and updated list of patient's medical providers Assessment of cognitive impairment was done Assessed patient's functional ability Established a written schedule for health screening services Health Risk Assessent Completed and Reviewed  Exercise Activities and Dietary recommendations  Goals      Increase water intake  Recommend increasing water intake to 6-8 glasses a day.          Immunization History  Administered Date(s) Administered   Fluad Quad(high Dose 65+) 03/09/2019   Influenza, High Dose Seasonal PF 02/26/2017, 05/11/2018   PPD Test 09/11/2015   Pneumococcal Conjugate-13 01/23/2015   Pneumococcal Polysaccharide-23 02/22/2016   Td 02/26/2017   Tdap 10/07/2006   Zoster, Live 07/06/2012    Health Maintenance  Topic Date Due   COVID-19 Vaccine (1) Never done   Zoster Vaccines- Shingrix (1 of 2) 04/10/1968   INFLUENZA VACCINE  02/06/2023   DEXA SCAN  06/19/2023   Medicare Annual Wellness (AWV)  03/24/2024   MAMMOGRAM  06/19/2024   DTaP/Tdap/Td (3 - Td or Tdap) 02/27/2027   Colonoscopy  01/24/2030   Pneumonia Vaccine 68+ Years old  Completed   Hepatitis C Screening  Completed   HPV VACCINES  Aged Out     Discussed health benefits of physical activity, and encouraged her to engage in regular exercise appropriate for her age and condition.     2. Primary hypertension Well controlled.  Continue current medications.    3. Cerebrovascular disease Asymptomatic. Compliant with medication.  Continue aggressive risk factor modification.    4. Osteopenia, unspecified location BMD due after 06/2023  5. Mixed hyperlipidemia She is tolerating rosuvastatin well with no adverse effects.  Anticipate checking lipids with goal LDL <55 in the next 1-2 months.   6. Insomnia, unspecified type refill- zolpidem (AMBIEN) 10 MG tablet; Take 1 tablet (10 mg total) by mouth at bedtime as needed for sleep. TAKE  ONE TABLET BY MOUTH ONE TIME DAILY AT BEDTIME  Dispense: 90 tablet; Refill: 1  7. Situational stress refill- LORazepam (ATIVAN) 1 MG tablet; TAKE ONE-HALF TO ONE TABLET BY MOUTH TWICE DAILY AS NEEDED FOR ANXIETY  Dispense: 20 tablet; Refill: 1  8. Allergic rhinitis due to pollen, unspecified seasonality  - azelastine (ASTELIN) 0.1 % nasal spray; Place 2 sprays into both nostrils 2 (two) times daily. Use in each nostril as directed  Dispense: 30 mL; Refill: 5  9. Cramp in lower leg  - Magnesium - TSH - Renal function panel  10. Coronary artery disease of native artery of native heart with stable angina pectoris (HCC) Asymptomatic. Compliant with medication.  Continue aggressive risk factor modification.  Follow up cardiology as scheduled.         Mila Merry, MD  [facility] [phone] (phone) [phone] (fax)  [facility]

## 2023-03-26 ENCOUNTER — Encounter: Payer: Medicare Other | Attending: Internal Medicine | Admitting: *Deleted

## 2023-03-26 DIAGNOSIS — M1990 Unspecified osteoarthritis, unspecified site: Secondary | ICD-10-CM | POA: Insufficient documentation

## 2023-03-26 DIAGNOSIS — Z955 Presence of coronary angioplasty implant and graft: Secondary | ICD-10-CM

## 2023-03-26 NOTE — Progress Notes (Signed)
Initial phone call completed. Diagnosis can be found in [facility] 8/30. EP Orientation scheduled for 10/3 at 2:30.

## 2023-04-02 ENCOUNTER — Telehealth: Payer: Self-pay | Admitting: Cardiology

## 2023-04-02 ENCOUNTER — Telehealth: Payer: Self-pay | Admitting: Internal Medicine

## 2023-04-02 MED ORDER — TICAGRELOR 90 MG PO TABS: 90.0000 mg | ORAL_TABLET | Freq: Two times a day (BID) | ORAL | 0 refills | Status: DC

## 2023-04-02 MED ORDER — ROSUVASTATIN CALCIUM 10 MG PO TABS: 10.0000 mg | ORAL_TABLET | Freq: Every day | ORAL | 0 refills | Status: DC

## 2023-04-02 MED ORDER — AMLODIPINE BESYLATE 10 MG PO TABS: 10.0000 mg | ORAL_TABLET | Freq: Every day | ORAL | 0 refills | Status: DC

## 2023-04-02 NOTE — Telephone Encounter (Signed)
Requested Prescriptions   Signed Prescriptions Disp Refills   amLODipine (NORVASC) 10 MG tablet 30 tablet 0    Sig: Take 1 tablet (10 mg total) by mouth daily.    Authorizing Provider: HAMMOCK, SHERI    Ordering User: Katrinka Blazing, Enya Bureau L   rosuvastatin (CRESTOR) 10 MG tablet 30 tablet 0    Sig: Take 1 tablet (10 mg total) by mouth daily.    Authorizing Provider: HAMMOCK, SHERI    Ordering User: Guerry Minors   ticagrelor (BRILINTA) 90 MG TABS tablet 60 tablet 0    Sig: Take 1 tablet (90 mg total) by mouth 2 (two) times daily.    Authorizing Provider: HAMMOCK, SHERI    Ordering User: Guerry Minors

## 2023-04-02 NOTE — Telephone Encounter (Signed)
[facility]. Please see below meds

## 2023-04-02 NOTE — Addendum Note (Signed)
Addended by: Guerry Minors on: 04/02/2023 03:47 PM   Modules accepted: Orders

## 2023-04-02 NOTE — Telephone Encounter (Signed)
patient is requesting to speak with [facility].....she says it is important

## 2023-04-02 NOTE — Telephone Encounter (Signed)
*  STAT* If patient is at the pharmacy, call can be transferred to refill team.   1. Which medications need to be refilled? (please list name of each medication and dose if known) amLODipine (NORVASC) 10 MG tablet ;  rosuvastatin (CRESTOR) 10 MG tablet ; ticagrelor (BRILINTA) 90 MG TABS tablet    2. Would you like to learn more about the convenience, safety, & potential cost savings by using the [facility]? No     3. Are you open to using the [facility] (Type [facility]. No ).   4. Which pharmacy/location (including street and city if local pharmacy) is medication to be sent to? [id number] [place], [place] - [address]    5. Do they need a 30 day or 90 day supply? 30    Patient said that at her appt with Charlsie [facility], they was supposed to send a 90 day supply of all three medications to [facility] - [place], [place] - [address], but was never sent. Patient needs medication asap

## 2023-04-02 NOTE — Telephone Encounter (Signed)
Called patient to let her know that 30 day supply has been sent to local pharmacy while waiting on mail order.

## 2023-04-02 NOTE — Telephone Encounter (Signed)
Requested Prescriptions   Signed Prescriptions Disp Refills   amLODipine (NORVASC) 10 MG tablet 90 tablet 0    Sig: Take 1 tablet (10 mg total) by mouth daily.    Authorizing Provider: HAMMOCK, SHERI    Ordering User: Katrinka Blazing, Zarrah Loveland L   rosuvastatin (CRESTOR) 10 MG tablet 90 tablet 0    Sig: Take 1 tablet (10 mg total) by mouth daily.    Authorizing Provider: HAMMOCK, SHERI    Ordering User: Guerry Minors   ticagrelor (BRILINTA) 90 MG TABS tablet 180 tablet 0    Sig: Take 1 tablet (90 mg total) by mouth 2 (two) times daily.    Authorizing Provider: HAMMOCK, SHERI    Ordering User: Guerry Minors

## 2023-04-03 NOTE — Telephone Encounter (Signed)
Patient is concerned that pharmacy told her that insurance will not pay for 30 day supply and 90 day supply.  Confirmed with pharmacy that ins will not cover both and they recommend patient pay out of pocket but probably will be expensive.  Informed patient what pharmacy told me and I advised that she see how much out of pocket would be and look med up on [facility] to compare prices.  Patient does have manufacturer coupon that was provided at hospital discharge.

## 2023-04-10 ENCOUNTER — Encounter: Payer: Medicare Other | Attending: Internal Medicine

## 2023-04-10 VITALS — Ht 63.0 in | Wt 134.6 lb

## 2023-04-10 DIAGNOSIS — Z955 Presence of coronary angioplasty implant and graft: Secondary | ICD-10-CM | POA: Insufficient documentation

## 2023-04-10 NOTE — Patient Instructions (Addendum)
Patient Instructions  Patient Details  Name: Meredith Russell MRN: [id number] Date of Birth: [birth year] Referring Provider:  Malva Limes, MD  Below are your personal goals for exercise, nutrition, and risk factors. Our goal is to help you stay on track towards obtaining and maintaining these goals. We will be discussing your progress on these goals with you throughout the program.  Initial Exercise Prescription:  Initial Exercise Prescription - 04/10/23 1600       Date of Initial Exercise RX and Referring Provider   Date 04/10/23    Referring Provider Dr. Yvonne Kendall, MD      Oxygen   Maintain Oxygen Saturation 88% or higher      Treadmill   MPH 2.8    Grade 1    Minutes 15    METs 3.53      NuStep   Level 3    SPM 80    Minutes 15    METs 3.9      T5 Nustep   Level 3    SPM 80    Minutes 15    METs 3.9      Prescription Details   Frequency (times per week) 3    Duration Progress to 30 minutes of continuous aerobic without signs/symptoms of physical distress      Intensity   THRR 40-80% of Max Heartrate 105-133    Ratings of Perceived Exertion 11-13    Perceived Dyspnea 0-4      Progression   Progression Continue to progress workloads to maintain intensity without signs/symptoms of physical distress.      Resistance Training   Training Prescription Yes    Weight 5 lb    Reps 10-15             Exercise Goals: Frequency: Be able to perform aerobic exercise two to three times per week in program working toward 2-5 days per week of home exercise.  Intensity: Work with a perceived exertion of 11 (fairly light) - 15 (hard) while following your exercise prescription.  We will make changes to your prescription with you as you progress through the program.   Duration: Be able to do 30 to 45 minutes of continuous aerobic exercise in addition to a 5 minute warm-up and a 5 minute cool-down routine.   Nutrition Goals: Your personal nutrition goals will be  established when you do your nutrition analysis with the dietician.  The following are general nutrition guidelines to follow: Cholesterol < 200mg /day Sodium < 1500mg /day Fiber: Women over 50 yrs - 21 grams per day  Personal Goals:  Personal Goals and Risk Factors at Admission - 03/26/23 1009       Core Components/Risk Factors/Patient Goals on Admission   Hypertension Yes    Intervention Provide education on lifestyle modifcations including regular physical activity/exercise, weight management, moderate sodium restriction and increased consumption of fresh fruit, vegetables, and low fat dairy, alcohol moderation, and smoking cessation.;Monitor prescription use compliance.    Expected Outcomes Short Term: Continued assessment and intervention until BP is < 140/57mm HG in hypertensive participants. < 130/79mm HG in hypertensive participants with diabetes, heart failure or chronic kidney disease.;Long Term: Maintenance of blood pressure at goal levels.    Lipids Yes    Intervention Provide education and support for participant on nutrition & aerobic/resistive exercise along with prescribed medications to achieve LDL 70mg , HDL >40mg .    Expected Outcomes Short Term: Participant states understanding of desired cholesterol values and is compliant with medications prescribed.  Participant is following exercise prescription and nutrition guidelines.;Long Term: Cholesterol controlled with medications as prescribed, with individualized exercise RX and with personalized nutrition plan. Value goals: LDL < 70mg , HDL > 40 mg.            Exercise Goals and Review:  Exercise Goals     Row Name 04/10/23 1605             Exercise Goals   Increase Physical Activity Yes       Intervention Provide advice, education, support and counseling about physical activity/exercise needs.;Develop an individualized exercise prescription for aerobic and resistive training based on initial evaluation findings, risk  stratification, comorbidities and participant's personal goals.       Expected Outcomes Short Term: Attend rehab on a regular basis to increase amount of physical activity.;Long Term: Add in home exercise to make exercise part of routine and to increase amount of physical activity.;Long Term: Exercising regularly at least 3-5 days a week.       Increase Strength and Stamina Yes       Intervention Provide advice, education, support and counseling about physical activity/exercise needs.;Develop an individualized exercise prescription for aerobic and resistive training based on initial evaluation findings, risk stratification, comorbidities and participant's personal goals.       Expected Outcomes Short Term: Increase workloads from initial exercise prescription for resistance, speed, and METs.;Short Term: Perform resistance training exercises routinely during rehab and add in resistance training at home;Long Term: Improve cardiorespiratory fitness, muscular endurance and strength as measured by increased METs and functional capacity ( )       Able to understand and use rate of perceived exertion (RPE) scale Yes       Intervention Provide education and explanation on how to use RPE scale       Expected Outcomes Short Term: Able to use RPE daily in rehab to express subjective intensity level;Long Term:  Able to use RPE to guide intensity level when exercising independently       Able to understand and use Dyspnea scale Yes       Intervention Provide education and explanation on how to use Dyspnea scale       Expected Outcomes Short Term: Able to use Dyspnea scale daily in rehab to express subjective sense of shortness of breath during exertion;Long Term: Able to use Dyspnea scale to guide intensity level when exercising independently       Knowledge and understanding of Target Heart Rate Range (THRR) Yes       Intervention Provide education and explanation of THRR including how the numbers were predicted  and where they are located for reference       Expected Outcomes Short Term: Able to state/look up THRR;Long Term: Able to use THRR to govern intensity when exercising independently;Short Term: Able to use daily as guideline for intensity in rehab       Able to check pulse independently Yes       Intervention Provide education and demonstration on how to check pulse in carotid and radial arteries.;Review the importance of being able to check your own pulse for safety during independent exercise       Expected Outcomes Short Term: Able to explain why pulse checking is important during independent exercise;Long Term: Able to check pulse independently and accurately       Understanding of Exercise Prescription Yes       Intervention Provide education, explanation, and written materials on patient's individual exercise prescription  Expected Outcomes Short Term: Able to explain program exercise prescription;Long Term: Able to explain home exercise prescription to exercise independently

## 2023-04-10 NOTE — Progress Notes (Signed)
Cardiac Individual Treatment Plan  Patient Details  Name: Meredith Russell MRN: [id number] Date of Birth: Feb 27, 1951 Referring Provider:   Flowsheet Row Cardiac Rehab from 04/10/2023 in [facility] Cardiac and Pulmonary Rehab  Referring Provider Dr. Yvonne Kendall, MD       Initial Encounter Date:  Flowsheet Row Cardiac Rehab from 04/10/2023 in [facility] Cardiac and Pulmonary Rehab  Date 04/10/23       Visit Diagnosis: Status post coronary artery stent placement  Patient's Home Medications on Admission:  Current Outpatient Medications:    amLODipine (NORVASC) 10 MG tablet, Take 1 tablet (10 mg total) by mouth daily., Disp: 30 tablet, Rfl: 0   aspirin EC 81 MG tablet, Take 1 tablet (81 mg total) by mouth daily. Swallow whole., Disp: 30 tablet, Rfl: 0   azelastine (ASTELIN) 0.1 % nasal spray, Place 2 sprays into both nostrils 2 (two) times daily. Use in each nostril as directed, Disp: 30 mL, Rfl: 5   butalbital-acetaminophen-caffeine (FIORICET) 50-325-40 MG tablet, TAKE 1-2 TABLET BY MOUTH EVERY 6 (SIX) HOURS AS NEEDED FOR HEADACHE., Disp: 30 tablet, Rfl: 5   calcipotriene (DOVONOX) 0.005 % cream, Apply 1 application  topically 3 (three) times daily as needed (psoriasis)., Disp: , Rfl:    Cholecalciferol (VITAMIN D3) 1000 units CAPS, Take 1,000 Units by mouth daily. , Disp: , Rfl:    citalopram (CELEXA) 20 MG tablet, Take 1 tablet (20 mg total) by mouth daily. (Patient not taking: Reported on 03/25/2023), Disp: 90 tablet, Rfl: 1   cycloSPORINE (RESTASIS) 0.05 % ophthalmic emulsion, Place 1 drop into both eyes 2 (two) times daily., Disp: , Rfl:    diclofenac Sodium (VOLTAREN) 1 % GEL, Apply topically., Disp: , Rfl:    fexofenadine (ALLEGRA) 180 MG tablet, TAKE 1 TABLET BY MOUTH EVERY DAY (Patient taking differently: Take 180 mg by mouth every other day.), Disp: 90 tablet, Rfl: 4   fluticasone (FLONASE) 50 MCG/ACT nasal spray, USE 2 SPRAYS IN BOTH NOSTRILS  DAILY AS NEEDED FOR ALLERGIES, Disp: 48 g,  Rfl: 3   folic acid (FOLVITE) 1 MG tablet, Take 1 mg by mouth daily., Disp: , Rfl:    halobetasol (ULTRAVATE) 0.05 % ointment, Apply 1 application topically daily as needed (psoriasis). , Disp: , Rfl:    leflunomide (ARAVA) 20 MG tablet, Take 20 mg by mouth daily., Disp: , Rfl:    LORazepam (ATIVAN) 1 MG tablet, TAKE ONE-HALF TO ONE TABLET BY MOUTH TWICE DAILY AS NEEDED FOR ANXIETY, Disp: 20 tablet, Rfl: 1   mupirocin ointment (BACTROBAN) 2 %, Apply 1 Application topically daily., Disp: 22 g, Rfl: 0   oxycodone-acetaminophen (LYNOX) 10-300 MG tablet, Take 1 tablet by mouth 3 (three) times daily., Disp: , Rfl:    Povidone (IVIZIA DRY EYES OP), Apply 2 drops to eye 2 (two) times daily., Disp: , Rfl:    pramoxine-hydrocortisone (ANALPRAM-HC) 1-1 % rectal cream, Place 1 application rectally 2 (two) times daily. (Patient taking differently: Place 1 application  rectally 2 (two) times daily as needed for hemorrhoids.), Disp: 30 g, Rfl: 2   promethazine (PHENERGAN) 25 MG tablet, Take 1 tablet by mouth every 8 (eight) hours as needed., Disp: , Rfl:    rosuvastatin (CRESTOR) 10 MG tablet, Take 1 tablet (10 mg total) by mouth daily., Disp: 30 tablet, Rfl: 0   ticagrelor (BRILINTA) 90 MG TABS tablet, Take 1 tablet (90 mg total) by mouth 2 (two) times daily., Disp: 60 tablet, Rfl: 0   tiZANidine (ZANAFLEX) 2 MG tablet, Take  2 mg by mouth 3 (three) times daily., Disp: , Rfl:    triamcinolone cream (KENALOG) 0.5 %, Apply 1 application topically 2 (two) times daily. As needed, Disp: , Rfl: 0   vitamin B-12 (CYANOCOBALAMIN) 1000 MCG tablet, Take 1,000 mcg by mouth daily., Disp: , Rfl:    vitamin C (ASCORBIC ACID) 500 MG tablet, Take 500 mg by mouth every other day., Disp: , Rfl:    zolpidem (AMBIEN) 10 MG tablet, Take 1 tablet (10 mg total) by mouth at bedtime as needed for sleep. TAKE ONE TABLET BY MOUTH ONE TIME DAILY AT BEDTIME, Disp: 90 tablet, Rfl: 1  Past Medical History: Past Medical History:  Diagnosis  Date   Dysplastic nevus 06/11/2022   left posterior thigh, shave removal 08/06/2022 margins clear   Hemorrhoids    Pneumonia due to COVID-19 virus    Psoriasis    Rheumatoid arthritis (HCC)    SCC (squamous cell carcinoma) 10/08/2022   mid chest right of mid line, EDC    Tobacco Use: Social History   Tobacco Use  Smoking Status Former   Current packs/day: 0.00   Types: Cigarettes   Quit date: 11/22/1987   Years since quitting: 35.4  Smokeless Tobacco Never    Labs: Review Flowsheet  More data exists      Latest Ref Rng & Units 02/22/2016 09/11/2017 03/09/2019 07/22/2022 03/08/2023  Labs for ITP Cardiac and Pulmonary Rehab  Cholestrol 0 - 200 mg/dL 213  086  578  469  629   LDL (calc) 0 - 99 mg/dL 96  528  413  96  89   HDL-C >40 mg/dL 80  244  80  010  89   Trlycerides <150 mg/dL 91  74  272  67  62   Hemoglobin A1c 4.8 - 5.6 % - - - - 6.0     Details             Exercise Target Goals: Exercise Program Goal: Individual exercise prescription set using results from initial 6 min walk test and THRR while considering  patient's activity barriers and safety.   Exercise Prescription Goal: Initial exercise prescription builds to 30-45 minutes a day of aerobic activity, 2-3 days per week.  Home exercise guidelines will be given to patient during program as part of exercise prescription that the participant will acknowledge.   Education: Aerobic Exercise: - Group verbal and visual presentation on the components of exercise prescription. Introduces F.I.T.T principle from ACSM for exercise prescriptions.  Reviews F.I.T.T. principles of aerobic exercise including progression. Written material given at graduation. Flowsheet Row Cardiac Rehab from 04/10/2023 in [facility] Cardiac and Pulmonary Rehab  Education need identified 04/10/23       Education: Resistance Exercise: - Group verbal and visual presentation on the components of exercise prescription. Introduces F.I.T.T principle from  ACSM for exercise prescriptions  Reviews F.I.T.T. principles of resistance exercise including progression. Written material given at graduation.    Education: Exercise & Equipment Safety: - Individual verbal instruction and demonstration of equipment use and safety with use of the equipment. Flowsheet Row Cardiac Rehab from 04/10/2023 in [facility] Cardiac and Pulmonary Rehab  Date 04/10/23  Educator NT  Instruction Review Code 1- Verbalizes Understanding       Education: Exercise Physiology & General Exercise Guidelines: - Group verbal and written instruction with models to review the exercise physiology of the cardiovascular system and associated critical values. Provides general exercise guidelines with specific guidelines to those with heart or lung  disease.    Education: Flexibility, Balance, Mind/Body Relaxation: - Group verbal and visual presentation with interactive activity on the components of exercise prescription. Introduces F.I.T.T principle from ACSM for exercise prescriptions. Reviews F.I.T.T. principles of flexibility and balance exercise training including progression. Also discusses the mind body connection.  Reviews various relaxation techniques to help reduce and manage stress (i.e. Deep breathing, progressive muscle relaxation, and visualization). Balance handout provided to take home. Written material given at graduation.   Activity Barriers & Risk Stratification:  Activity Barriers & Cardiac Risk Stratification - 03/26/23 1008       Activity Barriers & Cardiac Risk Stratification   Activity Barriers Back Problems;Left Knee Replacement;Right Knee Replacement;Neck/Spine Problems;Arthritis   hx of back surgery; right shoulder replacement   Cardiac Risk Stratification Moderate             6 Minute Walk:  6 Minute Walk     Row Name 04/10/23 1604         6 Minute Walk   Phase Initial     Distance 1610 feet     Walk Time 6 minutes     # of Rest Breaks 0     MPH  3.05     METS 3.9     RPE 9     Perceived Dyspnea  0     VO2 Peak 13.66     Symptoms Yes (comment)     Comments trouble taking a deep breath     Resting HR 77 bpm     Resting BP 128/68     Resting Oxygen Saturation  98 %     Exercise Oxygen Saturation  during 6 min walk 97 %     Max Ex. HR 132 bpm     Max Ex. BP 152/64     2 Minute Post BP 136/60              Oxygen Initial Assessment:   Oxygen Re-Evaluation:   Oxygen Discharge (Final Oxygen Re-Evaluation):   Initial Exercise Prescription:  Initial Exercise Prescription - 04/10/23 1600       Date of Initial Exercise RX and Referring Provider   Date 04/10/23    Referring Provider Dr. Yvonne Kendall, MD      Oxygen   Maintain Oxygen Saturation 88% or higher      Treadmill   MPH 2.8    Grade 1    Minutes 15    METs 3.53      NuStep   Level 3    SPM 80    Minutes 15    METs 3.9      T5 Nustep   Level 3    SPM 80    Minutes 15    METs 3.9      Prescription Details   Frequency (times per week) 3    Duration Progress to 30 minutes of continuous aerobic without signs/symptoms of physical distress      Intensity   THRR 40-80% of Max Heartrate 105-133    Ratings of Perceived Exertion 11-13    Perceived Dyspnea 0-4      Progression   Progression Continue to progress workloads to maintain intensity without signs/symptoms of physical distress.      Resistance Training   Training Prescription Yes    Weight 5 lb    Reps 10-15             Perform Capillary Blood Glucose checks as needed.  Exercise Prescription Changes:   Exercise  Prescription Changes     Row Name 04/10/23 1600             Response to Exercise   Blood Pressure (Admit) 128/68       Blood Pressure (Exercise) 152/64       Blood Pressure (Exit) 136/60       Heart Rate (Admit) 77 bpm       Heart Rate (Exercise) 132 bpm       Heart Rate (Exit) 86 bpm       Oxygen Saturation (Admit) 98 %       Oxygen Saturation (Exercise)  97 %       Rating of Perceived Exertion (Exercise) 9       Perceived Dyspnea (Exercise) 0       Symptoms trouble taking deep breath       Comments Results                Exercise Comments:   Exercise Goals and Review:   Exercise Goals     Row Name 04/10/23 1605             Exercise Goals   Increase Physical Activity Yes       Intervention Provide advice, education, support and counseling about physical activity/exercise needs.;Develop an individualized exercise prescription for aerobic and resistive training based on initial evaluation findings, risk stratification, comorbidities and participant's personal goals.       Expected Outcomes Short Term: Attend rehab on a regular basis to increase amount of physical activity.;Long Term: Add in home exercise to make exercise part of routine and to increase amount of physical activity.;Long Term: Exercising regularly at least 3-5 days a week.       Increase Strength and Stamina Yes       Intervention Provide advice, education, support and counseling about physical activity/exercise needs.;Develop an individualized exercise prescription for aerobic and resistive training based on initial evaluation findings, risk stratification, comorbidities and participant's personal goals.       Expected Outcomes Short Term: Increase workloads from initial exercise prescription for resistance, speed, and METs.;Short Term: Perform resistance training exercises routinely during rehab and add in resistance training at home;Long Term: Improve cardiorespiratory fitness, muscular endurance and strength as measured by increased METs and functional capacity ( )       Able to understand and use rate of perceived exertion (RPE) scale Yes       Intervention Provide education and explanation on how to use RPE scale       Expected Outcomes Short Term: Able to use RPE daily in rehab to express subjective intensity level;Long Term:  Able to use RPE to guide  intensity level when exercising independently       Able to understand and use Dyspnea scale Yes       Intervention Provide education and explanation on how to use Dyspnea scale       Expected Outcomes Short Term: Able to use Dyspnea scale daily in rehab to express subjective sense of shortness of breath during exertion;Long Term: Able to use Dyspnea scale to guide intensity level when exercising independently       Knowledge and understanding of Target Heart Rate Range (THRR) Yes       Intervention Provide education and explanation of THRR including how the numbers were predicted and where they are located for reference       Expected Outcomes Short Term: Able to state/look up THRR;Long Term: Able to use THRR to govern  intensity when exercising independently;Short Term: Able to use daily as guideline for intensity in rehab       Able to check pulse independently Yes       Intervention Provide education and demonstration on how to check pulse in carotid and radial arteries.;Review the importance of being able to check your own pulse for safety during independent exercise       Expected Outcomes Short Term: Able to explain why pulse checking is important during independent exercise;Long Term: Able to check pulse independently and accurately       Understanding of Exercise Prescription Yes       Intervention Provide education, explanation, and written materials on patient's individual exercise prescription       Expected Outcomes Short Term: Able to explain program exercise prescription;Long Term: Able to explain home exercise prescription to exercise independently                Exercise Goals Re-Evaluation :   Discharge Exercise Prescription (Final Exercise Prescription Changes):  Exercise Prescription Changes - 04/10/23 1600       Response to Exercise   Blood Pressure (Admit) 128/68    Blood Pressure (Exercise) 152/64    Blood Pressure (Exit) 136/60    Heart Rate (Admit) 77 bpm     Heart Rate (Exercise) 132 bpm    Heart Rate (Exit) 86 bpm    Oxygen Saturation (Admit) 98 %    Oxygen Saturation (Exercise) 97 %    Rating of Perceived Exertion (Exercise) 9    Perceived Dyspnea (Exercise) 0    Symptoms trouble taking deep breath    Comments Results             Nutrition:  Target Goals: Understanding of nutrition guidelines, daily intake of sodium 1500mg , cholesterol 200mg , calories 30% from fat and 7% or less from saturated fats, daily to have 5 or more servings of fruits and vegetables.  Education: All About Nutrition: -Group instruction provided by verbal, written material, interactive activities, discussions, models, and posters to present general guidelines for heart healthy nutrition including fat, fiber, MyPlate, the role of sodium in heart healthy nutrition, utilization of the nutrition label, and utilization of this knowledge for meal planning. Follow up email sent as well. Written material given at graduation. Flowsheet Row Cardiac Rehab from 04/10/2023 in [facility] Cardiac and Pulmonary Rehab  Education need identified 04/10/23       Biometrics:  Pre Biometrics - 04/10/23 1606       Pre Biometrics   Height 5\' 3"  (1.6 m)    Weight 134 lb 9.6 oz (61.1 kg)    Waist Circumference 32.5 inches    Hip Circumference 37 inches    Waist to Hip Ratio 0.88 %    BMI (Calculated) 23.85    Single Leg Stand 23.4 seconds              Nutrition Therapy Plan and Nutrition Goals:  Nutrition Therapy & Goals - 04/10/23 1611       Intervention Plan   Intervention Prescribe, educate and counsel regarding individualized specific dietary modifications aiming towards targeted core components such as weight, hypertension, lipid management, diabetes, heart failure and other comorbidities.    Expected Outcomes Short Term Goal: Understand basic principles of dietary content, such as calories, fat, sodium, cholesterol and nutrients.;Short Term Goal: A plan has been  developed with personal nutrition goals set during dietitian appointment.;Long Term Goal: Adherence to prescribed nutrition plan.  Nutrition Assessments:  MEDIFICTS Score Key: >=70 Need to make dietary changes  40-70 Heart Healthy Diet <= 40 Therapeutic Level Cholesterol Diet  Flowsheet Row Cardiac Rehab from 04/10/2023 in [facility] Cardiac and Pulmonary Rehab  Picture Your Plate Total Score on Admission 68      Picture Your Plate Scores: <16 Unhealthy dietary pattern with much room for improvement. 41-50 Dietary pattern unlikely to meet recommendations for good health and room for improvement. 51-60 More healthful dietary pattern, with some room for improvement.  >60 Healthy dietary pattern, although there may be some specific behaviors that could be improved.    Nutrition Goals Re-Evaluation:   Nutrition Goals Discharge (Final Nutrition Goals Re-Evaluation):   Psychosocial: Target Goals: Acknowledge presence or absence of significant depression and/or stress, maximize coping skills, provide positive support system. Participant is able to verbalize types and ability to use techniques and skills needed for reducing stress and depression.   Education: Stress, Anxiety, and Depression - Group verbal and visual presentation to define topics covered.  Reviews how body is impacted by stress, anxiety, and depression.  Also discusses healthy ways to reduce stress and to treat/manage anxiety and depression.  Written material given at graduation.   Education: Sleep Hygiene -Provides group verbal and written instruction about how sleep can affect your health.  Define sleep hygiene, discuss sleep cycles and impact of sleep habits. Review good sleep hygiene tips.    Initial Review & Psychosocial Screening:  Initial Psych Review & Screening - 03/26/23 1013       Initial Review   Current issues with Current Sleep Concerns;Current Stress Concerns;Current Depression      Family  Dynamics   Good Support System? Yes   husband, siblings, family     Barriers   Psychosocial barriers to participate in program There are no identifiable barriers or psychosocial needs.;The patient should benefit from training in stress management and relaxation.      Screening Interventions   Interventions Encouraged to exercise;Provide feedback about the scores to participant;To provide support and resources with identified psychosocial needs    Expected Outcomes Short Term goal: Utilizing psychosocial counselor, staff and physician to assist with identification of specific Stressors or current issues interfering with healing process. Setting desired goal for each stressor or current issue identified.;Long Term Goal: Stressors or current issues are controlled or eliminated.;Short Term goal: Identification and review with participant of any Quality of Life or Depression concerns found by scoring the questionnaire.;Long Term goal: The participant improves quality of Life and PHQ9 Scores as seen by post scores and/or verbalization of changes             Quality of Life Scores:   Quality of Life - 04/10/23 1612       Quality of Life   Select Quality of Life      Quality of Life Scores   Health/Function Pre 19.6 %    Socioeconomic Pre 28.29 %    Psych/Spiritual Pre 24.86 %    Family Pre 20.4 %    GLOBAL Pre 22.59 %            Scores of 19 and below usually indicate a poorer quality of life in these areas.  A difference of  2-3 points is a clinically meaningful difference.  A difference of 2-3 points in the total score of the Quality of Life Index has been associated with significant improvement in overall quality of life, self-image, physical symptoms, and general health in studies assessing change in  quality of life.  PHQ-9: Review Flowsheet  More data exists      04/10/2023 03/25/2023 11/04/2022 07/22/2022 02/20/2022  Depression screen PHQ 2/9  Decreased Interest 1 0 0 0 2  Down,  Depressed, Hopeless 1 0 0 1 2  PHQ - 2 Score 2 0 0 1 4  Altered sleeping 3 - 0 3 3  Tired, decreased energy 1 - 3 0 1  Change in appetite 1 - 2 0 1  Feeling bad or failure about yourself  0 - 0 0 0  Trouble concentrating 0 - 0 0 1  Moving slowly or fidgety/restless 1 - 0 0 0  Suicidal thoughts 0 - 0 0 0  PHQ-9 Score 8 - 5 4 10   Difficult doing work/chores Very difficult - Not difficult at all Not difficult at all Somewhat difficult    Details           Interpretation of Total Score  Total Score Depression Severity:  1-4 = Minimal depression, 5-9 = Mild depression, 10-14 = Moderate depression, 15-19 = Moderately severe depression, 20-27 = Severe depression   Psychosocial Evaluation and Intervention:  Psychosocial Evaluation - 03/26/23 1030       Psychosocial Evaluation & Interventions   Comments Jhenesis is coming to cardiac rehab after stent placement. She lives a very active lifestyle and is interested in coming to the program to learn more about the best way to exercise given her recent heart event. She used to play a lot of sports and she is unable to do that with her arthritis and other joint issues, so she tries to stay busy with other outdoor activities. She recently went off her depression medication because she is tired of taking medications. She was on it after one of her daughters passed away last year. She knows the grief will always be present and she is trying to spend extra time with her other daughter who doesn't live in [place]. When she is feeling down, she states she goes outside. She doesn't like to be inside, so she will find things to occupy her time outside. Her symptoms of dyspnea and chest discomfort made her originally think it was her lungs given her history of pneumonia,so she was surprised to find out it was her heart. 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 After her trip in October to visit her daughter, she is ready to get started  in the program to learn more about heart healthy living.    Expected Outcomes Short: attend cardiac rehab for education and exercise. Long: develop and maintain positive self care habits    Continue Psychosocial Services  Follow up required by staff             Psychosocial Re-Evaluation:   Psychosocial Discharge (Final Psychosocial Re-Evaluation):   Vocational Rehabilitation: Provide vocational rehab assistance to qualifying candidates.   Vocational Rehab Evaluation & Intervention:  Vocational Rehab - 03/26/23 1010       Initial Vocational Rehab Evaluation & Intervention   Assessment shows need for Vocational Rehabilitation No             Education: Education Goals: Education classes will be provided on a variety of topics geared toward better understanding of heart health and risk factor modification. Participant will state understanding/return demonstration of topics presented as noted by education test scores.  Learning Barriers/Preferences:  Learning Barriers/Preferences - 03/26/23 1010       Learning Barriers/Preferences  Learning Barriers None    Learning Preferences None             General Cardiac Education Topics:  AED/CPR: - Group verbal and written instruction with the use of models to demonstrate the basic use of the AED with the basic ABC's of resuscitation.   Anatomy and Cardiac Procedures: - Group verbal and visual presentation and models provide information about basic cardiac anatomy and function. Reviews the testing methods done to diagnose heart disease and the outcomes of the test results. Describes the treatment choices: Medical Management, Angioplasty, or Coronary Bypass Surgery for treating various heart conditions including Myocardial Infarction, Angina, Valve Disease, and Cardiac Arrhythmias.  Written material given at graduation. Flowsheet Row Cardiac Rehab from 04/10/2023 in [facility] Cardiac and Pulmonary Rehab  Education need  identified 04/10/23       Medication Safety: - Group verbal and visual instruction to review commonly prescribed medications for heart and lung disease. Reviews the medication, class of the drug, and side effects. Includes the steps to properly store meds and maintain the prescription regimen.  Written material given at graduation.   Intimacy: - Group verbal instruction through game format to discuss how heart and lung disease can affect sexual intimacy. Written material given at graduation..   Know Your Numbers and Heart Failure: - Group verbal and visual instruction to discuss disease risk factors for cardiac and pulmonary disease and treatment options.  Reviews associated critical values for Overweight/Obesity, Hypertension, Cholesterol, and Diabetes.  Discusses basics of heart failure: signs/symptoms and treatments.  Introduces Heart Failure Zone chart for action plan for heart failure.  Written material given at graduation.   Infection Prevention: - Provides verbal and written material to individual with discussion of infection control including proper hand washing and proper equipment cleaning during exercise session. Flowsheet Row Cardiac Rehab from 04/10/2023 in [facility] Cardiac and Pulmonary Rehab  Date 04/10/23  Educator NT  Instruction Review Code 1- Verbalizes Understanding       Falls Prevention: - Provides verbal and written material to individual with discussion of falls prevention and safety. Flowsheet Row Cardiac Rehab from 04/10/2023 in [facility] Cardiac and Pulmonary Rehab  Date 04/10/23  Educator NT  Instruction Review Code 1- Verbalizes Understanding       Other: -Provides group and verbal instruction on various topics (see comments)   Knowledge Questionnaire Score:  Knowledge Questionnaire Score - 04/10/23 1610       Knowledge Questionnaire Score   Pre Score 20/26             Core Components/Risk Factors/Patient Goals at Admission:  Personal Goals and  Risk Factors at Admission - 03/26/23 1009       Core Components/Risk Factors/Patient Goals on Admission   Hypertension Yes    Intervention Provide education on lifestyle modifcations including regular physical activity/exercise, weight management, moderate sodium restriction and increased consumption of fresh fruit, vegetables, and low fat dairy, alcohol moderation, and smoking cessation.;Monitor prescription use compliance.    Expected Outcomes Short Term: Continued assessment and intervention until BP is < 140/31mm HG in hypertensive participants. < 130/76mm HG in hypertensive participants with diabetes, heart failure or chronic kidney disease.;Long Term: Maintenance of blood pressure at goal levels.    Lipids Yes    Intervention Provide education and support for participant on nutrition & aerobic/resistive exercise along with prescribed medications to achieve LDL 70mg , HDL >40mg .    Expected Outcomes Short Term: Participant states understanding of desired cholesterol values and is compliant with medications  prescribed. Participant is following exercise prescription and nutrition guidelines.;Long Term: Cholesterol controlled with medications as prescribed, with individualized exercise RX and with personalized nutrition plan. Value goals: LDL < 70mg , HDL > 40 mg.             Education:Diabetes - Individual verbal and written instruction to review signs/symptoms of diabetes, desired ranges of glucose level fasting, after meals and with exercise. Acknowledge that pre and post exercise glucose checks will be done for 3 sessions at entry of program.   Core Components/Risk Factors/Patient Goals Review:    Core Components/Risk Factors/Patient Goals at Discharge (Final Review):    ITP Comments:  ITP Comments     Row Name 03/26/23 1010 04/10/23 1557         ITP Comments Initial phone call completed. Diagnosis can be found in [facility] 8/30. EP Orientation scheduled for 10/3 at 2:30. Completed  and gym orientation. Initial ITP created and sent for review to Dr. Bethann Punches, Medical Director.               Comments: Initial ITP

## 2023-05-01 NOTE — Progress Notes (Signed)
not hemodynamically significant by flow assessment (RFR = 0.93).  RCA demonstrates mild to moderate, nonobstructive disease. 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 Normal left  ventricular filling pressure (LVEDP 13 mmHg). Successful PCI to mid LAD using Onyx Frontier 2.75 x 18 mm drug-eluting stent with 0% residual stenosis and TIMI-3 flow. Tortuous right brachial and innominate arteries; consider use of a long sheath or alternative access if future catheterizations are needed.  Recommendations: Complete 2 hours of cangrelor infusion. Dual antiplatelet therapy with aspirin and ticagrelor for at least 12 months.  If symptomatic bradycardia is an issue, the patient could be transitioned to clopidogrel. Aggressive secondary prevention of coronary artery disease.  Favor medical therapy of LCx/OM/D1 disease.  Yvonne Kendall, MD [facility]  Findings Coronary Findings Diagnostic  Dominance: Right  Left Main Ost LM to Mid LM lesion is 40% stenosed.  Left Anterior Descending Vessel is moderate in size. Prox LAD to Mid LAD lesion is 95% stenosed.  First Diagonal Branch Vessel is small in size. 1st Diag lesion is 100% stenosed. The lesion is chronically occluded.  First Septal Branch Vessel is small in size.  Second Diagonal Branch Vessel is small in size.  Second Septal Branch Collaterals 2nd Sept filled by collaterals from RPDA.  Left Circumflex Vessel is large. Mid Cx to Dist Cx lesion is 70% stenosed. Pressure gradient was performed on the lesion. RFR: 0.93.  First Obtuse Marginal Branch Vessel is moderate in size. 1st Mrg lesion is 50% stenosed.  Second Obtuse Marginal Branch Vessel is moderate in size. Collaterals 2nd Mrg filled by collaterals from 1st Mrg.  2nd Mrg lesion is 100% stenosed. The lesion is chronically occluded.  Third Obtuse Marginal Branch Vessel is moderate in size. 3rd Mrg lesion is 55% stenosed.  Fourth Obtuse Marginal Branch Vessel is small in size.  Right Coronary Artery Vessel is moderate in size. Prox RCA lesion is 60% stenosed. Mid RCA lesion is 25% stenosed.  Right Posterior Descending Artery Vessel  is small in size.  Right Posterior Atrioventricular Artery Vessel is small in size.  First Right Posterolateral Branch Vessel is small in size.  Second Right Posterolateral Branch Vessel is small in size.  Intervention  Prox LAD to Mid LAD lesion Stent A stent was successfully placed. Post-Intervention Lesion Assessment The intervention was successful. Pre-interventional TIMI flow is 3. Post-intervention TIMI flow is 3. No complications occurred at this lesion. There is a 0% residual stenosis post intervention.     ECHOCARDIOGRAM  ECHOCARDIOGRAM COMPLETE 03/08/2023  Narrative ECHOCARDIOGRAM REPORT    Patient Name:   [name] Date of Exam: 03/08/2023 Medical Rec #:  [id number]      Height:       64.0 in Accession #:    [id number]     Weight:       140.0 lb Date of Birth:  Jun 01, 1951      BSA:          1.681 m Patient Age:    72 years       BP:           116/59 mmHg Patient Gender: F              HR:           64 bpm. Exam Location:  [facility]  Procedure: 2D Echo and Strain Analysis  Indications:     Chest Pain R07.9  History:  not hemodynamically significant by flow assessment (RFR = 0.93).  RCA demonstrates mild to moderate, nonobstructive disease. 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 Normal left  ventricular filling pressure (LVEDP 13 mmHg). Successful PCI to mid LAD using Onyx Frontier 2.75 x 18 mm drug-eluting stent with 0% residual stenosis and TIMI-3 flow. Tortuous right brachial and innominate arteries; consider use of a long sheath or alternative access if future catheterizations are needed.  Recommendations: Complete 2 hours of cangrelor infusion. Dual antiplatelet therapy with aspirin and ticagrelor for at least 12 months.  If symptomatic bradycardia is an issue, the patient could be transitioned to clopidogrel. Aggressive secondary prevention of coronary artery disease.  Favor medical therapy of LCx/OM/D1 disease.  Yvonne Kendall, MD [facility]  Findings Coronary Findings Diagnostic  Dominance: Right  Left Main Ost LM to Mid LM lesion is 40% stenosed.  Left Anterior Descending Vessel is moderate in size. Prox LAD to Mid LAD lesion is 95% stenosed.  First Diagonal Branch Vessel is small in size. 1st Diag lesion is 100% stenosed. The lesion is chronically occluded.  First Septal Branch Vessel is small in size.  Second Diagonal Branch Vessel is small in size.  Second Septal Branch Collaterals 2nd Sept filled by collaterals from RPDA.  Left Circumflex Vessel is large. Mid Cx to Dist Cx lesion is 70% stenosed. Pressure gradient was performed on the lesion. RFR: 0.93.  First Obtuse Marginal Branch Vessel is moderate in size. 1st Mrg lesion is 50% stenosed.  Second Obtuse Marginal Branch Vessel is moderate in size. Collaterals 2nd Mrg filled by collaterals from 1st Mrg.  2nd Mrg lesion is 100% stenosed. The lesion is chronically occluded.  Third Obtuse Marginal Branch Vessel is moderate in size. 3rd Mrg lesion is 55% stenosed.  Fourth Obtuse Marginal Branch Vessel is small in size.  Right Coronary Artery Vessel is moderate in size. Prox RCA lesion is 60% stenosed. Mid RCA lesion is 25% stenosed.  Right Posterior Descending Artery Vessel  is small in size.  Right Posterior Atrioventricular Artery Vessel is small in size.  First Right Posterolateral Branch Vessel is small in size.  Second Right Posterolateral Branch Vessel is small in size.  Intervention  Prox LAD to Mid LAD lesion Stent A stent was successfully placed. Post-Intervention Lesion Assessment The intervention was successful. Pre-interventional TIMI flow is 3. Post-intervention TIMI flow is 3. No complications occurred at this lesion. There is a 0% residual stenosis post intervention.     ECHOCARDIOGRAM  ECHOCARDIOGRAM COMPLETE 03/08/2023  Narrative ECHOCARDIOGRAM REPORT    Patient Name:   [name] Date of Exam: 03/08/2023 Medical Rec #:  [id number]      Height:       64.0 in Accession #:    [id number]     Weight:       140.0 lb Date of Birth:  Jun 01, 1951      BSA:          1.681 m Patient Age:    72 years       BP:           116/59 mmHg Patient Gender: F              HR:           64 bpm. Exam Location:  [facility]  Procedure: 2D Echo and Strain Analysis  Indications:     Chest Pain R07.9  History:  Cardiology Office Note    Date:  05/02/2023  ID:  KHANIYAH CANUTO, DOB [birth year], MRN [id number] PCP:  Malva Limes, MD  Cardiologist:  Yvonne Kendall, MD  Electrophysiologist:  None   Chief Complaint: Follow up for CAD   History of Present Illness: .    GENEIVE KAWAHARA is a 72 y.o. female with visit-pertinent history of CAD s/p PCI/DES to mid LAD in 03/2023, hypertension, rheumatoid arthritis, psoriatic arthritis, Raynaud's, migraines, GERD.  She was hospitalized in 03/2023 and evaluated for unstable angina.  She presents today for follow-up.  Ms. Swanigan has a longstanding history of RA and psoriatic arthritis with Raynaud's disease previously followed at [facility].  She is also former smoker.  She has a strong family history of early onset coronary artery disease.  In 03/2023 she presented to [facility] emergency department after being evaluated at [facility] urgent care for intermittent chest pain ongoing for 4 months.  She reported chest pain as heaviness and tightness that was substernal and radiated up into her neck and jaw with exertion that resolved after 1 to 2 minutes.  She had had multiple rheumatoid arthritis medication changes, she was unsure whether the discomfort was really cardiac or related to arthritis.  She had a high sensitive troponin of 46 x 2.  Echo revealed LVEF of 60 to 65%, no RWMA, trivial mitral regurgitation.  She underwent cardiac catheterization on 03/11/2023 which revealed severe multivessel coronary artery disease that included 95% mid LAD stenosis as well as chronic total occlusions of small D1 and OM 2 branches.  There is moderate multifocal left circumflex/OM disease and 70% mid left circumflex stenosis that was not hemodynamically significant by flow assessment with RFR of 0.93, RCA demonstrated mild to moderate nonobstructive disease.  She underwent successful PCI/DES to the mid LAD.  She was continued on dual antiplatelet therapy 12 months, with caveat that if symptomatic  bradycardia becomes an issue she could be transition to clopidogrel.  Medical therapy was favored for left circumflex/OM/D1.  She was last seen in clinic on 03/20/2023, she remained stable from a cardiac perspective.  She denied any further chest discomfort.  Been compliant with all medications.  She was referred to cardiac rehab.  She was continued on rosuvastatin 10 mg daily as there was noted concern for potential hepatotoxicity with multiple RA therapies.  Today she presents for follow up. She reports she has not been feeling well, she has nausea which she associates with Brilinta. Notes she has been having recent flares of her RA and psoriatic arthritis. She has had a busy last few weeks, hasn't been attending cardiac rehab but plans to restart next week. She has been exercising at home, tolerating well, denies anginal symptoms.   Labwork independently reviewed: 04/18/2023: Creatinine 0.9,  03/25/2023: Sodium 141, potassium 4.2, creatinine 0.86, magnesium 2.2 03/20/2023: Hemoglobin 12.8, hematocrit 38.2 ROS: .   Today she denies chest pain, shortness of breath, lower extremity edema, fatigue, palpitations, melena, hematuria, hemoptysis, diaphoresis, weakness, presyncope, syncope, orthopnea, and PND.  All other systems are reviewed and otherwise negative. Studies Reviewed: Marland Kitchen    EKG:  No EKG ordered today.   CV Studies:  Cardiac Studies & Procedures   CARDIAC CATHETERIZATION  CARDIAC CATHETERIZATION 03/11/2023  Narrative Conclusions: Severe multivessel coronary artery disease, as detailed below.  This includes 95% mid LAD stenosis as well as chronic total occlusions of small D1 and OM 2 branches.  There is moderate multifocal LCx/OM disease; the 70% mid LCx stenosis is  not hemodynamically significant by flow assessment (RFR = 0.93).  RCA demonstrates mild to moderate, nonobstructive disease. 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 Normal left  ventricular filling pressure (LVEDP 13 mmHg). Successful PCI to mid LAD using Onyx Frontier 2.75 x 18 mm drug-eluting stent with 0% residual stenosis and TIMI-3 flow. Tortuous right brachial and innominate arteries; consider use of a long sheath or alternative access if future catheterizations are needed.  Recommendations: Complete 2 hours of cangrelor infusion. Dual antiplatelet therapy with aspirin and ticagrelor for at least 12 months.  If symptomatic bradycardia is an issue, the patient could be transitioned to clopidogrel. Aggressive secondary prevention of coronary artery disease.  Favor medical therapy of LCx/OM/D1 disease.  Yvonne Kendall, MD [facility]  Findings Coronary Findings Diagnostic  Dominance: Right  Left Main Ost LM to Mid LM lesion is 40% stenosed.  Left Anterior Descending Vessel is moderate in size. Prox LAD to Mid LAD lesion is 95% stenosed.  First Diagonal Branch Vessel is small in size. 1st Diag lesion is 100% stenosed. The lesion is chronically occluded.  First Septal Branch Vessel is small in size.  Second Diagonal Branch Vessel is small in size.  Second Septal Branch Collaterals 2nd Sept filled by collaterals from RPDA.  Left Circumflex Vessel is large. Mid Cx to Dist Cx lesion is 70% stenosed. Pressure gradient was performed on the lesion. RFR: 0.93.  First Obtuse Marginal Branch Vessel is moderate in size. 1st Mrg lesion is 50% stenosed.  Second Obtuse Marginal Branch Vessel is moderate in size. Collaterals 2nd Mrg filled by collaterals from 1st Mrg.  2nd Mrg lesion is 100% stenosed. The lesion is chronically occluded.  Third Obtuse Marginal Branch Vessel is moderate in size. 3rd Mrg lesion is 55% stenosed.  Fourth Obtuse Marginal Branch Vessel is small in size.  Right Coronary Artery Vessel is moderate in size. Prox RCA lesion is 60% stenosed. Mid RCA lesion is 25% stenosed.  Right Posterior Descending Artery Vessel  is small in size.  Right Posterior Atrioventricular Artery Vessel is small in size.  First Right Posterolateral Branch Vessel is small in size.  Second Right Posterolateral Branch Vessel is small in size.  Intervention  Prox LAD to Mid LAD lesion Stent A stent was successfully placed. Post-Intervention Lesion Assessment The intervention was successful. Pre-interventional TIMI flow is 3. Post-intervention TIMI flow is 3. No complications occurred at this lesion. There is a 0% residual stenosis post intervention.     ECHOCARDIOGRAM  ECHOCARDIOGRAM COMPLETE 03/08/2023  Narrative ECHOCARDIOGRAM REPORT    Patient Name:   [name] Date of Exam: 03/08/2023 Medical Rec #:  [id number]      Height:       64.0 in Accession #:    [id number]     Weight:       140.0 lb Date of Birth:  Jun 01, 1951      BSA:          1.681 m Patient Age:    72 years       BP:           116/59 mmHg Patient Gender: F              HR:           64 bpm. Exam Location:  [facility]  Procedure: 2D Echo and Strain Analysis  Indications:     Chest Pain R07.9  History:  not hemodynamically significant by flow assessment (RFR = 0.93).  RCA demonstrates mild to moderate, nonobstructive disease. 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 Normal left  ventricular filling pressure (LVEDP 13 mmHg). Successful PCI to mid LAD using Onyx Frontier 2.75 x 18 mm drug-eluting stent with 0% residual stenosis and TIMI-3 flow. Tortuous right brachial and innominate arteries; consider use of a long sheath or alternative access if future catheterizations are needed.  Recommendations: Complete 2 hours of cangrelor infusion. Dual antiplatelet therapy with aspirin and ticagrelor for at least 12 months.  If symptomatic bradycardia is an issue, the patient could be transitioned to clopidogrel. Aggressive secondary prevention of coronary artery disease.  Favor medical therapy of LCx/OM/D1 disease.  Yvonne Kendall, MD [facility]  Findings Coronary Findings Diagnostic  Dominance: Right  Left Main Ost LM to Mid LM lesion is 40% stenosed.  Left Anterior Descending Vessel is moderate in size. Prox LAD to Mid LAD lesion is 95% stenosed.  First Diagonal Branch Vessel is small in size. 1st Diag lesion is 100% stenosed. The lesion is chronically occluded.  First Septal Branch Vessel is small in size.  Second Diagonal Branch Vessel is small in size.  Second Septal Branch Collaterals 2nd Sept filled by collaterals from RPDA.  Left Circumflex Vessel is large. Mid Cx to Dist Cx lesion is 70% stenosed. Pressure gradient was performed on the lesion. RFR: 0.93.  First Obtuse Marginal Branch Vessel is moderate in size. 1st Mrg lesion is 50% stenosed.  Second Obtuse Marginal Branch Vessel is moderate in size. Collaterals 2nd Mrg filled by collaterals from 1st Mrg.  2nd Mrg lesion is 100% stenosed. The lesion is chronically occluded.  Third Obtuse Marginal Branch Vessel is moderate in size. 3rd Mrg lesion is 55% stenosed.  Fourth Obtuse Marginal Branch Vessel is small in size.  Right Coronary Artery Vessel is moderate in size. Prox RCA lesion is 60% stenosed. Mid RCA lesion is 25% stenosed.  Right Posterior Descending Artery Vessel  is small in size.  Right Posterior Atrioventricular Artery Vessel is small in size.  First Right Posterolateral Branch Vessel is small in size.  Second Right Posterolateral Branch Vessel is small in size.  Intervention  Prox LAD to Mid LAD lesion Stent A stent was successfully placed. Post-Intervention Lesion Assessment The intervention was successful. Pre-interventional TIMI flow is 3. Post-intervention TIMI flow is 3. No complications occurred at this lesion. There is a 0% residual stenosis post intervention.     ECHOCARDIOGRAM  ECHOCARDIOGRAM COMPLETE 03/08/2023  Narrative ECHOCARDIOGRAM REPORT    Patient Name:   [name] Date of Exam: 03/08/2023 Medical Rec #:  [id number]      Height:       64.0 in Accession #:    [id number]     Weight:       140.0 lb Date of Birth:  Jun 01, 1951      BSA:          1.681 m Patient Age:    72 years       BP:           116/59 mmHg Patient Gender: F              HR:           64 bpm. Exam Location:  [facility]  Procedure: 2D Echo and Strain Analysis  Indications:     Chest Pain R07.9  History:  Cardiology Office Note    Date:  05/02/2023  ID:  KHANIYAH CANUTO, DOB [birth year], MRN [id number] PCP:  Malva Limes, MD  Cardiologist:  Yvonne Kendall, MD  Electrophysiologist:  None   Chief Complaint: Follow up for CAD   History of Present Illness: .    GENEIVE KAWAHARA is a 72 y.o. female with visit-pertinent history of CAD s/p PCI/DES to mid LAD in 03/2023, hypertension, rheumatoid arthritis, psoriatic arthritis, Raynaud's, migraines, GERD.  She was hospitalized in 03/2023 and evaluated for unstable angina.  She presents today for follow-up.  Ms. Swanigan has a longstanding history of RA and psoriatic arthritis with Raynaud's disease previously followed at [facility].  She is also former smoker.  She has a strong family history of early onset coronary artery disease.  In 03/2023 she presented to [facility] emergency department after being evaluated at [facility] urgent care for intermittent chest pain ongoing for 4 months.  She reported chest pain as heaviness and tightness that was substernal and radiated up into her neck and jaw with exertion that resolved after 1 to 2 minutes.  She had had multiple rheumatoid arthritis medication changes, she was unsure whether the discomfort was really cardiac or related to arthritis.  She had a high sensitive troponin of 46 x 2.  Echo revealed LVEF of 60 to 65%, no RWMA, trivial mitral regurgitation.  She underwent cardiac catheterization on 03/11/2023 which revealed severe multivessel coronary artery disease that included 95% mid LAD stenosis as well as chronic total occlusions of small D1 and OM 2 branches.  There is moderate multifocal left circumflex/OM disease and 70% mid left circumflex stenosis that was not hemodynamically significant by flow assessment with RFR of 0.93, RCA demonstrated mild to moderate nonobstructive disease.  She underwent successful PCI/DES to the mid LAD.  She was continued on dual antiplatelet therapy 12 months, with caveat that if symptomatic  bradycardia becomes an issue she could be transition to clopidogrel.  Medical therapy was favored for left circumflex/OM/D1.  She was last seen in clinic on 03/20/2023, she remained stable from a cardiac perspective.  She denied any further chest discomfort.  Been compliant with all medications.  She was referred to cardiac rehab.  She was continued on rosuvastatin 10 mg daily as there was noted concern for potential hepatotoxicity with multiple RA therapies.  Today she presents for follow up. She reports she has not been feeling well, she has nausea which she associates with Brilinta. Notes she has been having recent flares of her RA and psoriatic arthritis. She has had a busy last few weeks, hasn't been attending cardiac rehab but plans to restart next week. She has been exercising at home, tolerating well, denies anginal symptoms.   Labwork independently reviewed: 04/18/2023: Creatinine 0.9,  03/25/2023: Sodium 141, potassium 4.2, creatinine 0.86, magnesium 2.2 03/20/2023: Hemoglobin 12.8, hematocrit 38.2 ROS: .   Today she denies chest pain, shortness of breath, lower extremity edema, fatigue, palpitations, melena, hematuria, hemoptysis, diaphoresis, weakness, presyncope, syncope, orthopnea, and PND.  All other systems are reviewed and otherwise negative. Studies Reviewed: Marland Kitchen    EKG:  No EKG ordered today.   CV Studies:  Cardiac Studies & Procedures   CARDIAC CATHETERIZATION  CARDIAC CATHETERIZATION 03/11/2023  Narrative Conclusions: Severe multivessel coronary artery disease, as detailed below.  This includes 95% mid LAD stenosis as well as chronic total occlusions of small D1 and OM 2 branches.  There is moderate multifocal LCx/OM disease; the 70% mid LCx stenosis is

## 2023-05-02 ENCOUNTER — Ambulatory Visit: Payer: Medicare Other | Attending: Cardiology | Admitting: Cardiology

## 2023-05-02 ENCOUNTER — Encounter: Payer: Self-pay | Admitting: Cardiology

## 2023-05-02 ENCOUNTER — Other Ambulatory Visit: Payer: Self-pay | Admitting: Family Medicine

## 2023-05-02 VITALS — BP 122/72 | HR 78 | Ht 63.0 in | Wt 135.2 lb

## 2023-05-02 DIAGNOSIS — G43009 Migraine without aura, not intractable, without status migrainosus: Secondary | ICD-10-CM

## 2023-05-02 DIAGNOSIS — Z79899 Other long term (current) drug therapy: Secondary | ICD-10-CM

## 2023-05-02 DIAGNOSIS — E782 Mixed hyperlipidemia: Secondary | ICD-10-CM | POA: Diagnosis not present

## 2023-05-02 DIAGNOSIS — I10 Essential (primary) hypertension: Secondary | ICD-10-CM | POA: Diagnosis not present

## 2023-05-02 DIAGNOSIS — L4050 Arthropathic psoriasis, unspecified: Secondary | ICD-10-CM

## 2023-05-02 DIAGNOSIS — M069 Rheumatoid arthritis, unspecified: Secondary | ICD-10-CM

## 2023-05-02 DIAGNOSIS — I2510 Atherosclerotic heart disease of native coronary artery without angina pectoris: Secondary | ICD-10-CM | POA: Diagnosis not present

## 2023-05-02 MED ORDER — CLOPIDOGREL BISULFATE 75 MG PO TABS: 75.0000 mg | ORAL_TABLET | Freq: Every day | ORAL | 3 refills | Status: DC

## 2023-05-02 MED ORDER — RENFLEXIS 100 MG IV SOLR: 100.0000 mg | INTRAVENOUS | Status: DC

## 2023-05-02 MED ORDER — ASPIRIN 81 MG PO TBEC: 81.0000 mg | DELAYED_RELEASE_TABLET | Freq: Every day | ORAL | Status: AC

## 2023-05-02 MED ORDER — NITROGLYCERIN 0.4 MG SL SUBL: 0.4000 mg | SUBLINGUAL_TABLET | SUBLINGUAL | 3 refills | Status: DC | PRN

## 2023-05-02 MED ORDER — BUTALBITAL-APAP-CAFFEINE 50-325-40 MG PO TABS
ORAL_TABLET | ORAL | 5 refills | Status: AC
Start: 2023-05-02 — End: ?

## 2023-05-02 MED ORDER — PROMETHAZINE HCL 25 MG PO TABS: 25.0000 mg | ORAL_TABLET | Freq: Three times a day (TID) | ORAL | 3 refills | Status: DC | PRN

## 2023-05-02 NOTE — Patient Instructions (Addendum)
Medication Instructions:  Your physician has recommended you make the following change in your medication:   STOP - icagrelor (BRILINTA) 90 MG TABS tablet (take dose today and tonight only)  START - clopidogrel (PLAVIX) 75 MG tablet  daily (loading dose tomorrow 300 mg 4 tablets)   AS NEEDED - nitroGLYCERIN (NITROSTAT) 0.4 MG SL tablet - Place 1 tablet (0.4 mg total) under the tongue every 5 (five) minutes as needed for chest pain. 1 tablet may be used every 5 minutes as needed, for up to 15 minutes. Do not take more than 3 tablets in 15 minutes. If you do not feel better or feel worse after 1 dose, call [phone] at once  *If you need a refill on your cardiac medications before your next appointment, please call your pharmacy*  Lab Work: -None ordered  Testing/Procedures: -None ordered  Follow-Up: At [facility], you and your health needs are our priority.  As part of our continuing mission to provide you with exceptional heart care, we have created designated Provider Care Teams.  These Care Teams include your primary Cardiologist (physician) and Advanced Practice Providers (APPs -  Physician Assistants and Nurse Practitioners) who all work together to provide you with the care you need, when you need it.  Your next appointment:   3 month(s)  Provider:   Charlsie Quest, NP    Other Instructions -None

## 2023-05-07 ENCOUNTER — Encounter: Payer: Self-pay | Admitting: *Deleted

## 2023-05-07 DIAGNOSIS — Z955 Presence of coronary angioplasty implant and graft: Secondary | ICD-10-CM

## 2023-05-07 NOTE — Progress Notes (Signed)
Cardiac Individual Treatment Plan  Patient Details  Name: Meredith Russell MRN: [id number] Date of Birth: [birth year] Referring Provider:   Flowsheet Row Cardiac Rehab from 04/10/2023 in [facility] Cardiac and Pulmonary Rehab  Referring Provider Dr. Yvonne Kendall, MD       Initial Encounter Date:  Flowsheet Row Cardiac Rehab from 04/10/2023 in [facility] Cardiac and Pulmonary Rehab  Date 04/10/23       Visit Diagnosis: Status post coronary artery stent placement  Patient's Home Medications on Admission:  Current Outpatient Medications:    amLODipine (NORVASC) 10 MG tablet, Take 1 tablet (10 mg total) by mouth daily., Disp: 30 tablet, Rfl: 0   aspirin EC 81 MG tablet, Take 1 tablet (81 mg total) by mouth daily. Swallow whole., Disp: , Rfl:    azelastine (ASTELIN) 0.1 % nasal spray, Place 2 sprays into both nostrils 2 (two) times daily. Use in each nostril as directed, Disp: 30 mL, Rfl: 5   butalbital-acetaminophen-caffeine (FIORICET) 50-325-40 MG tablet, TAKE 1-2 TABLET BY MOUTH EVERY 6 (SIX) HOURS AS NEEDED FOR HEADACHE., Disp: 30 tablet, Rfl: 5   calcipotriene (DOVONOX) 0.005 % cream, Apply 1 application  topically 3 (three) times daily as needed (psoriasis)., Disp: , Rfl:    Cholecalciferol (VITAMIN D3) 1000 units CAPS, Take 1,000 Units by mouth daily. , Disp: , Rfl:    citalopram (CELEXA) 20 MG tablet, Take 1 tablet (20 mg total) by mouth daily. (Patient not taking: Reported on 03/25/2023), Disp: 90 tablet, Rfl: 1   clopidogrel (PLAVIX) 75 MG tablet, Take 1 tablet (75 mg total) by mouth daily. Loading dose of 300 mg (4 tablets) morning of 05/03/23 then 75 mg (1 tablet) thereafter, Disp: 94 tablet, Rfl: 3   cycloSPORINE (RESTASIS) 0.05 % ophthalmic emulsion, Place 1 drop into both eyes 2 (two) times daily., Disp: , Rfl:    diclofenac Sodium (VOLTAREN) 1 % GEL, Apply topically., Disp: , Rfl:    fexofenadine (ALLEGRA) 180 MG tablet, TAKE 1 TABLET BY MOUTH EVERY DAY (Patient taking differently: Take  180 mg by mouth every other day.), Disp: 90 tablet, Rfl: 4   fluticasone (FLONASE) 50 MCG/ACT nasal spray, USE 2 SPRAYS IN BOTH NOSTRILS  DAILY AS NEEDED FOR ALLERGIES, Disp: 48 g, Rfl: 3   folic acid (FOLVITE) 1 MG tablet, Take 1 mg by mouth daily. (Patient not taking: Reported on 05/02/2023), Disp: , Rfl:    halobetasol (ULTRAVATE) 0.05 % ointment, Apply 1 application topically daily as needed (psoriasis). , Disp: , Rfl:    [START ON 06/26/2023] inFLIXimab-abda (RENFLEXIS) 100 MG SOLR, Inject 100 mg into the vein every 2 (two) months., Disp: , Rfl:    leflunomide (ARAVA) 20 MG tablet, Take 20 mg by mouth daily., Disp: , Rfl:    LORazepam (ATIVAN) 1 MG tablet, TAKE ONE-HALF TO ONE TABLET BY MOUTH TWICE DAILY AS NEEDED FOR ANXIETY, Disp: 20 tablet, Rfl: 1   mupirocin ointment (BACTROBAN) 2 %, Apply 1 Application topically daily., Disp: 22 g, Rfl: 0   nitroGLYCERIN (NITROSTAT) 0.4 MG SL tablet, Place 1 tablet (0.4 mg total) under the tongue every 5 (five) minutes as needed for chest pain. 1 tablet may be used every 5 minutes as needed, for up to 15 minutes. Do not take more than 3 tablets in 15 minutes. If you do not feel better or feel worse after 1 dose, call 9-1-1 at once., Disp: 90 tablet, Rfl: 3   oxyCODONE-acetaminophen (PERCOCET) 10-325 MG tablet, Take 1 tablet by mouth 3 (three) times  daily., Disp: , Rfl:    Povidone (IVIZIA DRY EYES OP), Apply 2 drops to eye 2 (two) times daily., Disp: , Rfl:    pramoxine-hydrocortisone (ANALPRAM-HC) 1-1 % rectal cream, Place 1 application rectally 2 (two) times daily. (Patient taking differently: Place 1 application  rectally 2 (two) times daily as needed for hemorrhoids.), Disp: 30 g, Rfl: 2   promethazine (PHENERGAN) 25 MG tablet, Take 1 tablet (25 mg total) by mouth every 8 (eight) hours as needed., Disp: 30 tablet, Rfl: 3   rosuvastatin (CRESTOR) 10 MG tablet, Take 1 tablet (10 mg total) by mouth daily., Disp: 30 tablet, Rfl: 0   tiZANidine (ZANAFLEX) 2 MG  tablet, Take 2 mg by mouth 3 (three) times daily., Disp: , Rfl:    triamcinolone cream (KENALOG) 0.5 %, Apply 1 application topically 2 (two) times daily. As needed, Disp: , Rfl: 0   vitamin B-12 (CYANOCOBALAMIN) 1000 MCG tablet, Take 1,000 mcg by mouth daily., Disp: , Rfl:    vitamin C (ASCORBIC ACID) 500 MG tablet, Take 500 mg by mouth every other day., Disp: , Rfl:    zolpidem (AMBIEN) 10 MG tablet, Take 1 tablet (10 mg total) by mouth at bedtime as needed for sleep. TAKE ONE TABLET BY MOUTH ONE TIME DAILY AT BEDTIME, Disp: 90 tablet, Rfl: 1  Past Medical History: Past Medical History:  Diagnosis Date   Dysplastic nevus 06/11/2022   left posterior thigh, shave removal 08/06/2022 margins clear   Hemorrhoids    Pneumonia due to COVID-19 virus    Psoriasis    Rheumatoid arthritis (HCC)    SCC (squamous cell carcinoma) 10/08/2022   mid chest right of mid line, EDC    Tobacco Use: Social History   Tobacco Use  Smoking Status Former   Current packs/day: 0.00   Types: Cigarettes   Quit date: 11/22/1987   Years since quitting: 35.4  Smokeless Tobacco Never    Labs: Review Flowsheet  More data exists      Latest Ref Rng & Units 02/22/2016 09/11/2017 03/09/2019 07/22/2022 03/08/2023  Labs for ITP Cardiac and Pulmonary Rehab  Cholestrol 0 - 200 mg/dL 045  409  811  914  782   LDL (calc) 0 - 99 mg/dL 96  956  213  96  89   HDL-C >40 mg/dL 80  086  80  578  89   Trlycerides <150 mg/dL 91  74  469  67  62   Hemoglobin A1c 4.8 - 5.6 % - - - - 6.0     Details             Exercise Target Goals: Exercise Program Goal: Individual exercise prescription set using results from initial 6 min walk test and THRR while considering  patient's activity barriers and safety.   Exercise Prescription Goal: Initial exercise prescription builds to 30-45 minutes a day of aerobic activity, 2-3 days per week.  Home exercise guidelines will be given to patient during program as part of exercise  prescription that the participant will acknowledge.   Education: Aerobic Exercise: - Group verbal and visual presentation on the components of exercise prescription. Introduces F.I.T.T principle from ACSM for exercise prescriptions.  Reviews F.I.T.T. principles of aerobic exercise including progression. Written material given at graduation. Flowsheet Row Cardiac Rehab from 04/10/2023 in [facility] Cardiac and Pulmonary Rehab  Education need identified 04/10/23       Education: Resistance Exercise: - Group verbal and visual presentation on the components of exercise prescription. Introduces  F.I.T.T principle from ACSM for exercise prescriptions  Reviews F.I.T.T. principles of resistance exercise including progression. Written material given at graduation.    Education: Exercise & Equipment Safety: - Individual verbal instruction and demonstration of equipment use and safety with use of the equipment. Flowsheet Row Cardiac Rehab from 04/10/2023 in [facility] Cardiac and Pulmonary Rehab  Date 04/10/23  Educator NT  Instruction Review Code 1- Verbalizes Understanding       Education: Exercise Physiology & General Exercise Guidelines: - Group verbal and written instruction with models to review the exercise physiology of the cardiovascular system and associated critical values. Provides general exercise guidelines with specific guidelines to those with heart or lung disease.    Education: Flexibility, Balance, Mind/Body Relaxation: - Group verbal and visual presentation with interactive activity on the components of exercise prescription. Introduces F.I.T.T principle from ACSM for exercise prescriptions. Reviews F.I.T.T. principles of flexibility and balance exercise training including progression. Also discusses the mind body connection.  Reviews various relaxation techniques to help reduce and manage stress (i.e. Deep breathing, progressive muscle relaxation, and visualization). Balance handout provided  to take home. Written material given at graduation.   Activity Barriers & Risk Stratification:  Activity Barriers & Cardiac Risk Stratification - 03/26/23 1008       Activity Barriers & Cardiac Risk Stratification   Activity Barriers Back Problems;Left Knee Replacement;Right Knee Replacement;Neck/Spine Problems;Arthritis   hx of back surgery; right shoulder replacement   Cardiac Risk Stratification Moderate             6 Minute Walk:  6 Minute Walk     Row Name 04/10/23 1604         6 Minute Walk   Phase Initial     Distance 1610 feet     Walk Time 6 minutes     # of Rest Breaks 0     MPH 3.05     METS 3.9     RPE 9     Perceived Dyspnea  0     VO2 Peak 13.66     Symptoms Yes (comment)     Comments trouble taking a deep breath     Resting HR 77 bpm     Resting BP 128/68     Resting Oxygen Saturation  98 %     Exercise Oxygen Saturation  during 6 min walk 97 %     Max Ex. HR 132 bpm     Max Ex. BP 152/64     2 Minute Post BP 136/60              Oxygen Initial Assessment:   Oxygen Re-Evaluation:   Oxygen Discharge (Final Oxygen Re-Evaluation):   Initial Exercise Prescription:  Initial Exercise Prescription - 04/10/23 1600       Date of Initial Exercise RX and Referring Provider   Date 04/10/23    Referring Provider Dr. Yvonne Kendall, MD      Oxygen   Maintain Oxygen Saturation 88% or higher      Treadmill   MPH 2.8    Grade 1    Minutes 15    METs 3.53      NuStep   Level 3    SPM 80    Minutes 15    METs 3.9      T5 Nustep   Level 3    SPM 80    Minutes 15    METs 3.9      Prescription Details   Frequency (times  per week) 3    Duration Progress to 30 minutes of continuous aerobic without signs/symptoms of physical distress      Intensity   THRR 40-80% of Max Heartrate 105-133    Ratings of Perceived Exertion 11-13    Perceived Dyspnea 0-4      Progression   Progression Continue to progress workloads to maintain  intensity without signs/symptoms of physical distress.      Resistance Training   Training Prescription Yes    Weight 5 lb    Reps 10-15             Perform Capillary Blood Glucose checks as needed.  Exercise Prescription Changes:   Exercise Prescription Changes     Row Name 04/10/23 1600             Response to Exercise   Blood Pressure (Admit) 128/68       Blood Pressure (Exercise) 152/64       Blood Pressure (Exit) 136/60       Heart Rate (Admit) 77 bpm       Heart Rate (Exercise) 132 bpm       Heart Rate (Exit) 86 bpm       Oxygen Saturation (Admit) 98 %       Oxygen Saturation (Exercise) 97 %       Rating of Perceived Exertion (Exercise) 9       Perceived Dyspnea (Exercise) 0       Symptoms trouble taking deep breath       Comments Results                Exercise Comments:   Exercise Goals and Review:   Exercise Goals     Row Name 04/10/23 1605             Exercise Goals   Increase Physical Activity Yes       Intervention Provide advice, education, support and counseling about physical activity/exercise needs.;Develop an individualized exercise prescription for aerobic and resistive training based on initial evaluation findings, risk stratification, comorbidities and participant's personal goals.       Expected Outcomes Short Term: Attend rehab on a regular basis to increase amount of physical activity.;Long Term: Add in home exercise to make exercise part of routine and to increase amount of physical activity.;Long Term: Exercising regularly at least 3-5 days a week.       Increase Strength and Stamina Yes       Intervention Provide advice, education, support and counseling about physical activity/exercise needs.;Develop an individualized exercise prescription for aerobic and resistive training based on initial evaluation findings, risk stratification, comorbidities and participant's personal goals.       Expected Outcomes Short Term:  Increase workloads from initial exercise prescription for resistance, speed, and METs.;Short Term: Perform resistance training exercises routinely during rehab and add in resistance training at home;Long Term: Improve cardiorespiratory fitness, muscular endurance and strength as measured by increased METs and functional capacity ( )       Able to understand and use rate of perceived exertion (RPE) scale Yes       Intervention Provide education and explanation on how to use RPE scale       Expected Outcomes Short Term: Able to use RPE daily in rehab to express subjective intensity level;Long Term:  Able to use RPE to guide intensity level when exercising independently       Able to understand and use Dyspnea scale Yes  Intervention Provide education and explanation on how to use Dyspnea scale       Expected Outcomes Short Term: Able to use Dyspnea scale daily in rehab to express subjective sense of shortness of breath during exertion;Long Term: Able to use Dyspnea scale to guide intensity level when exercising independently       Knowledge and understanding of Target Heart Rate Range (THRR) Yes       Intervention Provide education and explanation of THRR including how the numbers were predicted and where they are located for reference       Expected Outcomes Short Term: Able to state/look up THRR;Long Term: Able to use THRR to govern intensity when exercising independently;Short Term: Able to use daily as guideline for intensity in rehab       Able to check pulse independently Yes       Intervention Provide education and demonstration on how to check pulse in carotid and radial arteries.;Review the importance of being able to check your own pulse for safety during independent exercise       Expected Outcomes Short Term: Able to explain why pulse checking is important during independent exercise;Long Term: Able to check pulse independently and accurately       Understanding of Exercise  Prescription Yes       Intervention Provide education, explanation, and written materials on patient's individual exercise prescription       Expected Outcomes Short Term: Able to explain program exercise prescription;Long Term: Able to explain home exercise prescription to exercise independently                Exercise Goals Re-Evaluation :   Discharge Exercise Prescription (Final Exercise Prescription Changes):  Exercise Prescription Changes - 04/10/23 1600       Response to Exercise   Blood Pressure (Admit) 128/68    Blood Pressure (Exercise) 152/64    Blood Pressure (Exit) 136/60    Heart Rate (Admit) 77 bpm    Heart Rate (Exercise) 132 bpm    Heart Rate (Exit) 86 bpm    Oxygen Saturation (Admit) 98 %    Oxygen Saturation (Exercise) 97 %    Rating of Perceived Exertion (Exercise) 9    Perceived Dyspnea (Exercise) 0    Symptoms trouble taking deep breath    Comments Results             Nutrition:  Target Goals: Understanding of nutrition guidelines, daily intake of sodium 1500mg , cholesterol 200mg , calories 30% from fat and 7% or less from saturated fats, daily to have 5 or more servings of fruits and vegetables.  Education: All About Nutrition: -Group instruction provided by verbal, written material, interactive activities, discussions, models, and posters to present general guidelines for heart healthy nutrition including fat, fiber, MyPlate, the role of sodium in heart healthy nutrition, utilization of the nutrition label, and utilization of this knowledge for meal planning. Follow up email sent as well. Written material given at graduation. Flowsheet Row Cardiac Rehab from 04/10/2023 in [facility] Cardiac and Pulmonary Rehab  Education need identified 04/10/23       Biometrics:  Pre Biometrics - 04/10/23 1606       Pre Biometrics   Height 5\' 3"  (1.6 m)    Weight 134 lb 9.6 oz (61.1 kg)    Waist Circumference 32.5 inches    Hip Circumference 37 inches     Waist to Hip Ratio 0.88 %    BMI (Calculated) 23.85    Single Leg Stand  23.4 seconds              Nutrition Therapy Plan and Nutrition Goals:  Nutrition Therapy & Goals - 04/10/23 1611       Intervention Plan   Intervention Prescribe, educate and counsel regarding individualized specific dietary modifications aiming towards targeted core components such as weight, hypertension, lipid management, diabetes, heart failure and other comorbidities.    Expected Outcomes Short Term Goal: Understand basic principles of dietary content, such as calories, fat, sodium, cholesterol and nutrients.;Short Term Goal: A plan has been developed with personal nutrition goals set during dietitian appointment.;Long Term Goal: Adherence to prescribed nutrition plan.             Nutrition Assessments:  MEDIFICTS Score Key: >=70 Need to make dietary changes  40-70 Heart Healthy Diet <= 40 Therapeutic Level Cholesterol Diet  Flowsheet Row Cardiac Rehab from 04/10/2023 in [facility] Cardiac and Pulmonary Rehab  Picture Your Plate Total Score on Admission 68      Picture Your Plate Scores: <16 Unhealthy dietary pattern with much room for improvement. 41-50 Dietary pattern unlikely to meet recommendations for good health and room for improvement. 51-60 More healthful dietary pattern, with some room for improvement.  >60 Healthy dietary pattern, although there may be some specific behaviors that could be improved.    Nutrition Goals Re-Evaluation:   Nutrition Goals Discharge (Final Nutrition Goals Re-Evaluation):   Psychosocial: Target Goals: Acknowledge presence or absence of significant depression and/or stress, maximize coping skills, provide positive support system. Participant is able to verbalize types and ability to use techniques and skills needed for reducing stress and depression.   Education: Stress, Anxiety, and Depression - Group verbal and visual presentation to define topics covered.   Reviews how body is impacted by stress, anxiety, and depression.  Also discusses healthy ways to reduce stress and to treat/manage anxiety and depression.  Written material given at graduation.   Education: Sleep Hygiene -Provides group verbal and written instruction about how sleep can affect your health.  Define sleep hygiene, discuss sleep cycles and impact of sleep habits. Review good sleep hygiene tips.    Initial Review & Psychosocial Screening:  Initial Psych Review & Screening - 03/26/23 1013       Initial Review   Current issues with Current Sleep Concerns;Current Stress Concerns;Current Depression      Family Dynamics   Good Support System? Yes   husband, siblings, family     Barriers   Psychosocial barriers to participate in program There are no identifiable barriers or psychosocial needs.;The patient should benefit from training in stress management and relaxation.      Screening Interventions   Interventions Encouraged to exercise;Provide feedback about the scores to participant;To provide support and resources with identified psychosocial needs    Expected Outcomes Short Term goal: Utilizing psychosocial counselor, staff and physician to assist with identification of specific Stressors or current issues interfering with healing process. Setting desired goal for each stressor or current issue identified.;Long Term Goal: Stressors or current issues are controlled or eliminated.;Short Term goal: Identification and review with participant of any Quality of Life or Depression concerns found by scoring the questionnaire.;Long Term goal: The participant improves quality of Life and PHQ9 Scores as seen by post scores and/or verbalization of changes             Quality of Life Scores:   Quality of Life - 04/10/23 1612       Quality of Life   Select  Quality of Life      Quality of Life Scores   Health/Function Pre 19.6 %    Socioeconomic Pre 28.29 %    Psych/Spiritual Pre  24.86 %    Family Pre 20.4 %    GLOBAL Pre 22.59 %            Scores of 19 and below usually indicate a poorer quality of life in these areas.  A difference of  2-3 points is a clinically meaningful difference.  A difference of 2-3 points in the total score of the Quality of Life Index has been associated with significant improvement in overall quality of life, self-image, physical symptoms, and general health in studies assessing change in quality of life.  PHQ-9: Review Flowsheet  More data exists      04/10/2023 03/25/2023 11/04/2022 07/22/2022 02/20/2022  Depression screen PHQ 2/9  Decreased Interest 1 0 0 0 2  Down, Depressed, Hopeless 1 0 0 1 2  PHQ - 2 Score 2 0 0 1 4  Altered sleeping 3 - 0 3 3  Tired, decreased energy 1 - 3 0 1  Change in appetite 1 - 2 0 1  Feeling bad or failure about yourself  0 - 0 0 0  Trouble concentrating 0 - 0 0 1  Moving slowly or fidgety/restless 1 - 0 0 0  Suicidal thoughts 0 - 0 0 0  PHQ-9 Score 8 - 5 4 10   Difficult doing work/chores Very difficult - Not difficult at all Not difficult at all Somewhat difficult    Details           Interpretation of Total Score  Total Score Depression Severity:  1-4 = Minimal depression, 5-9 = Mild depression, 10-14 = Moderate depression, 15-19 = Moderately severe depression, 20-27 = Severe depression   Psychosocial Evaluation and Intervention:  Psychosocial Evaluation - 03/26/23 1030       Psychosocial Evaluation & Interventions   Comments Meredith Russell is coming to cardiac rehab after stent placement. She lives a very active lifestyle and is interested in coming to the program to learn more about the best way to exercise given her recent heart event. She used to play a lot of sports and she is unable to do that with her arthritis and other joint issues, so she tries to stay busy with other outdoor activities. She recently went off her depression medication because she is tired of taking medications. She was on it  after one of her daughters passed away last year. She knows the grief will always be present and she is trying to spend extra time with her other daughter who doesn't live in [place]. When she is feeling down, she states she goes outside. She doesn't like to be inside, so she will find things to occupy her time outside. Her symptoms of dyspnea and chest discomfort made her originally think it was her lungs given her history of pneumonia,so she was surprised to find out it was her heart. 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 After her trip in October to visit her daughter, she is ready to get started in the program to learn more about heart healthy living.    Expected Outcomes Short: attend cardiac rehab for education and exercise. Long: develop and maintain positive self care habits    Continue Psychosocial Services  Follow up required by staff             Psychosocial  Re-Evaluation:   Psychosocial Discharge (Final Psychosocial Re-Evaluation):   Vocational Rehabilitation: Provide vocational rehab assistance to qualifying candidates.   Vocational Rehab Evaluation & Intervention:  Vocational Rehab - 03/26/23 1010       Initial Vocational Rehab Evaluation & Intervention   Assessment shows need for Vocational Rehabilitation No             Education: Education Goals: Education classes will be provided on a variety of topics geared toward better understanding of heart health and risk factor modification. Participant will state understanding/return demonstration of topics presented as noted by education test scores.  Learning Barriers/Preferences:  Learning Barriers/Preferences - 03/26/23 1010       Learning Barriers/Preferences   Learning Barriers None    Learning Preferences None             General Cardiac Education Topics:  AED/CPR: - Group verbal and written instruction with the use of models to demonstrate the basic use of the AED  with the basic ABC's of resuscitation.   Anatomy and Cardiac Procedures: - Group verbal and visual presentation and models provide information about basic cardiac anatomy and function. Reviews the testing methods done to diagnose heart disease and the outcomes of the test results. Describes the treatment choices: Medical Management, Angioplasty, or Coronary Bypass Surgery for treating various heart conditions including Myocardial Infarction, Angina, Valve Disease, and Cardiac Arrhythmias.  Written material given at graduation. Flowsheet Row Cardiac Rehab from 04/10/2023 in [facility] Cardiac and Pulmonary Rehab  Education need identified 04/10/23       Medication Safety: - Group verbal and visual instruction to review commonly prescribed medications for heart and lung disease. Reviews the medication, class of the drug, and side effects. Includes the steps to properly store meds and maintain the prescription regimen.  Written material given at graduation.   Intimacy: - Group verbal instruction through game format to discuss how heart and lung disease can affect sexual intimacy. Written material given at graduation..   Know Your Numbers and Heart Failure: - Group verbal and visual instruction to discuss disease risk factors for cardiac and pulmonary disease and treatment options.  Reviews associated critical values for Overweight/Obesity, Hypertension, Cholesterol, and Diabetes.  Discusses basics of heart failure: signs/symptoms and treatments.  Introduces Heart Failure Zone chart for action plan for heart failure.  Written material given at graduation.   Infection Prevention: - Provides verbal and written material to individual with discussion of infection control including proper hand washing and proper equipment cleaning during exercise session. Flowsheet Row Cardiac Rehab from 04/10/2023 in [facility] Re[facility]  Date 04/10/23  Educator NT  Instruction Review Code 1- Verbalizes  Understanding       Falls Prevention: - Provides verbal and written material to individual with discussion of falls prevention and safety. Flowsheet Row Cardiac Rehab from 04/10/2023 in [facility] Cardiac and Pulmonary Rehab  Date 04/10/23  Educator NT  Instruction Review Code 1- Verbalizes Understanding       Other: -Provides group and verbal instruction on various topics (see comments)   Knowledge Questionnaire Score:  Knowledge Questionnaire Score - 04/10/23 1610       Knowledge Questionnaire Score   Pre Score 20/26             Core Components/Risk Factors/Patient Goals at Admission:  Personal Goals and Risk Factors at Admission - 03/26/23 1009       Core Components/Risk Factors/Patient Goals on Admission   Hypertension Yes    Intervention Provide education  on lifestyle modifcations including regular physical activity/exercise, weight management, moderate sodium restriction and increased consumption of fresh fruit, vegetables, and low fat dairy, alcohol moderation, and smoking cessation.;Monitor prescription use compliance.    Expected Outcomes Short Term: Continued assessment and intervention until BP is < 140/31mm HG in hypertensive participants. < 130/79mm HG in hypertensive participants with diabetes, heart failure or chronic kidney disease.;Long Term: Maintenance of blood pressure at goal levels.    Lipids Yes    Intervention Provide education and support for participant on nutrition & aerobic/resistive exercise along with prescribed medications to achieve LDL 70mg , HDL >40mg .    Expected Outcomes Short Term: Participant states understanding of desired cholesterol values and is compliant with medications prescribed. Participant is following exercise prescription and nutrition guidelines.;Long Term: Cholesterol controlled with medications as prescribed, with individualized exercise RX and with personalized nutrition plan. Value goals: LDL < 70mg , HDL > 40 mg.              Education:Diabetes - Individual verbal and written instruction to review signs/symptoms of diabetes, desired ranges of glucose level fasting, after meals and with exercise. Acknowledge that pre and post exercise glucose checks will be done for 3 sessions at entry of program.   Core Components/Risk Factors/Patient Goals Review:    Core Components/Risk Factors/Patient Goals at Discharge (Final Review):    ITP Comments:  ITP Comments     Row Name 03/26/23 1010 04/10/23 1557 05/07/23 0854       ITP Comments Initial phone call completed. Diagnosis can be found in [facility] 8/30. EP Orientation scheduled for 10/3 at 2:30. Completed and gym orientation. Initial ITP created and sent for review to Dr. Bethann Punches, Medical Director. 30 Day review completed. Medical Director ITP review done, changes made as directed, and signed approval by Medical Director.   new to program              Comments:

## 2023-05-12 ENCOUNTER — Encounter: Payer: Medicare Other | Attending: Internal Medicine | Admitting: *Deleted

## 2023-05-12 DIAGNOSIS — Z955 Presence of coronary angioplasty implant and graft: Secondary | ICD-10-CM | POA: Diagnosis present

## 2023-05-12 NOTE — Progress Notes (Signed)
Daily Session Note  Patient Details  Name: Meredith Russell MRN: [id number] Date of Birth: December 24, 1950 Referring Provider:   Flowsheet Row Cardiac Rehab from 04/10/2023 in [facility] Cardiac and Pulmonary Rehab  Referring Provider Dr. Yvonne Kendall, MD       Encounter Date: 05/12/2023  Check In:  Session Check In - 05/12/23 0805       Check-In   Supervising physician immediately available to respond to emergencies See telemetry face sheet for immediately available ER MD    Location [facility]-Cardiac & Pulmonary Rehab    Staff Present Cora Collum, RN, BSN, CCRP;Joseph [place], RCP,RRT,BSRT;Kelly [place], BS, ACSM CEP, Exercise Physiologist;Jason Wallace [place], RDN, LDN    Virtual Visit No    Medication changes reported     No    Fall or balance concerns reported    No    Warm-up and Cool-down Performed on first and last piece of equipment    Resistance Training Performed Yes    VAD Patient? No    PAD/SET Patient? No      Pain Assessment   Currently in Pain? No/denies                Social History   Tobacco Use  Smoking Status Former   Current packs/day: 0.00   Types: Cigarettes   Quit date: 11/22/1987   Years since quitting: 35.4  Smokeless Tobacco Never    Goals Met:  Exercise tolerated well Personal goals reviewed No report of concerns or symptoms today.  Goals Unmet:  Not Applicable  Comments: First full day of exercise!  Patient was oriented to gym and equipment including functions, settings, policies, and procedures.  Patient's individual exercise prescription and treatment plan were reviewed.  All starting workloads were established based on the results of the 6 minute walk test done at initial orientation visit.  The plan for exercise progression was also introduced and progression will be customized based on patient's performance and goals.    Dr. Bethann Punches is Medical Director for [facility] Cardiac Rehabilitation.  Dr. Vida Rigger is Medical Director for [facility]  Pulmonary Rehabilitation.

## 2023-05-14 ENCOUNTER — Encounter: Payer: Medicare Other | Admitting: *Deleted

## 2023-05-14 DIAGNOSIS — Z955 Presence of coronary angioplasty implant and graft: Secondary | ICD-10-CM | POA: Diagnosis not present

## 2023-05-14 NOTE — Progress Notes (Signed)
Daily Session Note  Patient Details  Name: Meredith Russell MRN: [id number] Date of Birth: 07-01-1951 Referring Provider:   Flowsheet Row Cardiac Rehab from 04/10/2023 in [facility] Cardiac and Pulmonary Rehab  Referring Provider Dr. Yvonne Kendall, MD       Encounter Date: 05/14/2023  Check In:  Session Check In - 05/14/23 0754       Check-In   Supervising physician immediately available to respond to emergencies See telemetry face sheet for immediately available ER MD    Location [facility]-Cardiac & Pulmonary Rehab    Staff Present Ronette Deter, BS, Exercise Physiologist;Joseph [place], RCP,RRT,BSRT;Maxon [place] BS, , Exercise Physiologist;Anayely Constantine Katrinka Blazing, RN, ADN    Virtual Visit No    Medication changes reported     No    Fall or balance concerns reported    No    Warm-up and Cool-down Performed on first and last piece of equipment    Resistance Training Performed Yes    VAD Patient? No    PAD/SET Patient? No      Pain Assessment   Currently in Pain? No/denies                Social History   Tobacco Use  Smoking Status Former   Current packs/day: 0.00   Types: Cigarettes   Quit date: 11/22/1987   Years since quitting: 35.4  Smokeless Tobacco Never    Goals Met:  Independence with exercise equipment Exercise tolerated well No report of concerns or symptoms today Strength training completed today  Goals Unmet:  Not Applicable  Comments: Pt able to follow exercise prescription today without complaint.  Will continue to monitor for progression.    Dr. Bethann Punches is Medical Director for [facility] Cardiac Rehabilitation.  Dr. Vida Rigger is Medical Director for [facility] Pulmonary Rehabilitation.

## 2023-05-16 ENCOUNTER — Encounter: Payer: Medicare Other | Admitting: *Deleted

## 2023-05-16 DIAGNOSIS — Z955 Presence of coronary angioplasty implant and graft: Secondary | ICD-10-CM | POA: Diagnosis not present

## 2023-05-16 NOTE — Progress Notes (Signed)
Daily Session Note  Patient Details  Name: Meredith Russell MRN: [id number] Date of Birth: Apr 02, 1951 Referring Provider:   Flowsheet Row Cardiac Rehab from 04/10/2023 in [facility] Cardiac and Pulmonary Rehab  Referring Provider Dr. Yvonne Kendall, MD       Encounter Date: 05/16/2023  Check In:  Session Check In - 05/16/23 0750       Check-In   Supervising physician immediately available to respond to emergencies See telemetry face sheet for immediately available ER MD    Location [facility]-Cardiac & Pulmonary Rehab    Staff Present Cora Collum, RN, BSN, CCRP;Noah Tickle, BS, Exercise Physiologist;Joseph [place], [place]    Medication changes reported     No    Fall or balance concerns reported    No    Warm-up and Cool-down Performed on first and last piece of equipment    Resistance Training Performed Yes    VAD Patient? No    PAD/SET Patient? No      Pain Assessment   Currently in Pain? No/denies                Social History   Tobacco Use  Smoking Status Former   Current packs/day: 0.00   Types: Cigarettes   Quit date: 11/22/1987   Years since quitting: 35.5  Smokeless Tobacco Never    Goals Met:  Independence with exercise equipment Exercise tolerated well No report of concerns or symptoms today  Goals Unmet:  Not Applicable  Comments: Pt able to follow exercise prescription today without complaint.  Will continue to monitor for progression.    Dr. Bethann Punches is Medical Director for [facility] Cardiac Rehabilitation.  Dr. Vida Rigger is Medical Director for [facility] Pulmonary Rehabilitation.

## 2023-05-19 ENCOUNTER — Encounter: Payer: Medicare Other | Admitting: *Deleted

## 2023-05-19 DIAGNOSIS — Z955 Presence of coronary angioplasty implant and graft: Secondary | ICD-10-CM

## 2023-05-19 NOTE — Progress Notes (Signed)
Daily Session Note  Patient Details  Name: KIELEE BUI MRN: [id number] Date of Birth: Mar 24, 1951 Referring Provider:   Flowsheet Row Cardiac Rehab from 04/10/2023 in [facility] Cardiac and Pulmonary Rehab  Referring Provider Dr. Yvonne Kendall, MD       Encounter Date: 05/19/2023  Check In:  Session Check In - 05/19/23 0928       Check-In   Supervising physician immediately available to respond to emergencies See telemetry face sheet for immediately available ER MD    Location [facility]-Cardiac & Pulmonary Rehab    Staff Present Cora Collum, RN, BSN, CCRP;Maxon [place] BS, , Exercise Physiologist;Kelly Madilyn Fireman, BS, ACSM CEP, Exercise Physiologist;Corayma Cashatt Katrinka Blazing, RN, ADN    Virtual Visit No    Medication changes reported     No    Fall or balance concerns reported    No    Warm-up and Cool-down Performed on first and last piece of equipment    Resistance Training Performed Yes    VAD Patient? No    PAD/SET Patient? No      Pain Assessment   Currently in Pain? No/denies                Social History   Tobacco Use  Smoking Status Former   Current packs/day: 0.00   Types: Cigarettes   Quit date: 11/22/1987   Years since quitting: 35.5  Smokeless Tobacco Never    Goals Met:  Independence with exercise equipment Exercise tolerated well No report of concerns or symptoms today Strength training completed today  Goals Unmet:  Not Applicable  Comments: Pt able to follow exercise prescription today without complaint.  Will continue to monitor for progression.    Dr. Bethann Punches is Medical Director for [facility] Cardiac Rehabilitation.  Dr. Vida Rigger is Medical Director for [facility] Pulmonary Rehabilitation.

## 2023-05-21 ENCOUNTER — Encounter: Payer: Medicare Other | Admitting: *Deleted

## 2023-05-21 DIAGNOSIS — Z955 Presence of coronary angioplasty implant and graft: Secondary | ICD-10-CM | POA: Diagnosis not present

## 2023-05-21 NOTE — Progress Notes (Signed)
Daily Session Note  Patient Details  Name: MELESSA ECKELMAN MRN: [id number] Date of Birth: Jul 03, 1951 Referring Provider:   Flowsheet Row Cardiac Rehab from 04/10/2023 in [facility] Cardiac and Pulmonary Rehab  Referring Provider Dr. Yvonne Kendall, MD       Encounter Date: 05/21/2023  Check In:  Session Check In - 05/21/23 0924       Check-In   Supervising physician immediately available to respond to emergencies See telemetry face sheet for immediately available ER MD    Location [facility]-Cardiac & Pulmonary Rehab    Staff Present Ronette Deter, BS, Exercise Physiologist;Joseph Reino Kent, Guinevere Ferrari, RN, ADN;Maxon [employer], , Exercise Physiologist    Virtual Visit No    Medication changes reported     No    Fall or balance concerns reported    No    Warm-up and Cool-down Performed on first and last piece of equipment    Resistance Training Performed Yes    VAD Patient? No    PAD/SET Patient? No      Pain Assessment   Currently in Pain? No/denies                Social History   Tobacco Use  Smoking Status Former   Current packs/day: 0.00   Types: Cigarettes   Quit date: 11/22/1987   Years since quitting: 35.5  Smokeless Tobacco Never    Goals Met:  Independence with exercise equipment Exercise tolerated well No report of concerns or symptoms today Strength training completed today  Goals Unmet:  Not Applicable  Comments: Pt able to follow exercise prescription today without complaint.  Will continue to monitor for progression.    Dr. Bethann Punches is Medical Director for [facility] Cardiac Rehabilitation.  Dr. Vida Rigger is Medical Director for [facility] Pulmonary Rehabilitation.

## 2023-05-23 ENCOUNTER — Encounter: Payer: Medicare Other | Admitting: *Deleted

## 2023-05-23 DIAGNOSIS — Z955 Presence of coronary angioplasty implant and graft: Secondary | ICD-10-CM | POA: Diagnosis not present

## 2023-05-23 NOTE — Progress Notes (Signed)
Daily Session Note  Patient Details  Name: TAIZ HORTON MRN: [id number] Date of Birth: [birth year] Referring Provider:   Flowsheet Row Cardiac Rehab from 04/10/2023 in [facility] Cardiac and Pulmonary Rehab  Referring Provider Dr. Yvonne Kendall, MD       Encounter Date: 05/23/2023  Check In:  Session Check In - 05/23/23 0939       Check-In   Supervising physician immediately available to respond to emergencies See telemetry face sheet for immediately available ER MD    Location [facility]-Cardiac & Pulmonary Rehab    Staff Present Elige Ko, RCP,RRT,BSRT;Noah Tickle, BS, Exercise Physiologist    Virtual Visit No    Medication changes reported     No    Fall or balance concerns reported    No    Warm-up and Cool-down Performed on first and last piece of equipment    Resistance Training Performed Yes    VAD Patient? No    PAD/SET Patient? No      Pain Assessment   Currently in Pain? No/denies                Social History   Tobacco Use  Smoking Status Former   Current packs/day: 0.00   Types: Cigarettes   Quit date: 11/22/1987   Years since quitting: 35.5  Smokeless Tobacco Never    Goals Met:  Independence with exercise equipment Exercise tolerated well No report of concerns or symptoms today  Goals Unmet:  Not Applicable  Comments: Pt able to follow exercise prescription today without complaint.  Will continue to monitor for progression.    Dr. Bethann Punches is Medical Director for [facility] Cardiac Rehabilitation.  Dr. Vida Rigger is Medical Director for [facility] Pulmonary Rehabilitation.

## 2023-05-26 ENCOUNTER — Encounter: Payer: Medicare Other | Admitting: *Deleted

## 2023-05-26 DIAGNOSIS — Z955 Presence of coronary angioplasty implant and graft: Secondary | ICD-10-CM

## 2023-05-26 NOTE — Progress Notes (Signed)
Daily Session Note  Patient Details  Name: Meredith Russell MRN: [id number] Date of Birth: 1951/05/24 Referring Provider:   Flowsheet Row Cardiac Rehab from 04/10/2023 in [facility] Cardiac and Pulmonary Rehab  Referring Provider Dr. Yvonne Kendall, MD       Encounter Date: 05/26/2023  Check In:  Session Check In - 05/26/23 0935       Check-In   Supervising physician immediately available to respond to emergencies See telemetry face sheet for immediately available ER MD    Location [facility]-Cardiac & Pulmonary Rehab    Staff Present Bess Kinds RN, BSN;Margaret Best, MS, Exercise Physiologist;Kelly Madilyn Fireman, BS, ACSM CEP, Exercise Physiologist;Rashed Edler Katrinka Blazing, RN, ADN    Virtual Visit No    Medication changes reported     No    Fall or balance concerns reported    No    Warm-up and Cool-down Performed on first and last piece of equipment    Resistance Training Performed Yes    VAD Patient? No    PAD/SET Patient? No      Pain Assessment   Currently in Pain? No/denies                Social History   Tobacco Use  Smoking Status Former   Current packs/day: 0.00   Types: Cigarettes   Quit date: 11/22/1987   Years since quitting: 35.5  Smokeless Tobacco Never    Goals Met:  Independence with exercise equipment Exercise tolerated well No report of concerns or symptoms today Strength training completed today  Goals Unmet:  Not Applicable  Comments: Pt able to follow exercise prescription today without complaint.  Will continue to monitor for progression.    Dr. Bethann Punches is Medical Director for [facility] Cardiac Rehabilitation.  Dr. Vida Rigger is Medical Director for [facility] Pulmonary Rehabilitation.

## 2023-05-28 ENCOUNTER — Encounter: Payer: Self-pay | Admitting: *Deleted

## 2023-05-28 DIAGNOSIS — Z955 Presence of coronary angioplasty implant and graft: Secondary | ICD-10-CM

## 2023-05-28 NOTE — Progress Notes (Signed)
Cardiac Individual Treatment Plan  Patient Details  Name: Meredith Russell MRN: [id number] Date of Birth: 01-Apr-1951 Referring Provider:   Flowsheet Row Cardiac Rehab from 04/10/2023 in [facility] Cardiac and Pulmonary Rehab  Referring Provider Dr. Yvonne Kendall, MD       Initial Encounter Date:  Flowsheet Row Cardiac Rehab from 04/10/2023 in [facility] Cardiac and Pulmonary Rehab  Date 04/10/23       Visit Diagnosis: Status post coronary artery stent placement  Patient's Home Medications on Admission:  Current Outpatient Medications:    amLODipine (NORVASC) 10 MG tablet, Take 1 tablet (10 mg total) by mouth daily., Disp: 30 tablet, Rfl: 0   aspirin EC 81 MG tablet, Take 1 tablet (81 mg total) by mouth daily. Swallow whole., Disp: , Rfl:    azelastine (ASTELIN) 0.1 % nasal spray, Place 2 sprays into both nostrils 2 (two) times daily. Use in each nostril as directed, Disp: 30 mL, Rfl: 5   butalbital-acetaminophen-caffeine (FIORICET) 50-325-40 MG tablet, TAKE 1-2 TABLET BY MOUTH EVERY 6 (SIX) HOURS AS NEEDED FOR HEADACHE., Disp: 30 tablet, Rfl: 5   calcipotriene (DOVONOX) 0.005 % cream, Apply 1 application  topically 3 (three) times daily as needed (psoriasis)., Disp: , Rfl:    Cholecalciferol (VITAMIN D3) 1000 units CAPS, Take 1,000 Units by mouth daily. , Disp: , Rfl:    citalopram (CELEXA) 20 MG tablet, Take 1 tablet (20 mg total) by mouth daily. (Patient not taking: Reported on 03/25/2023), Disp: 90 tablet, Rfl: 1   clopidogrel (PLAVIX) 75 MG tablet, Take 1 tablet (75 mg total) by mouth daily. Loading dose of 300 mg (4 tablets) morning of 05/03/23 then 75 mg (1 tablet) thereafter, Disp: 94 tablet, Rfl: 3   cycloSPORINE (RESTASIS) 0.05 % ophthalmic emulsion, Place 1 drop into both eyes 2 (two) times daily., Disp: , Rfl:    diclofenac Sodium (VOLTAREN) 1 % GEL, Apply topically., Disp: , Rfl:    fexofenadine (ALLEGRA) 180 MG tablet, TAKE 1 TABLET BY MOUTH EVERY DAY (Patient taking differently: Take  180 mg by mouth every other day.), Disp: 90 tablet, Rfl: 4   fluticasone (FLONASE) 50 MCG/ACT nasal spray, USE 2 SPRAYS IN BOTH NOSTRILS  DAILY AS NEEDED FOR ALLERGIES, Disp: 48 g, Rfl: 3   folic acid (FOLVITE) 1 MG tablet, Take 1 mg by mouth daily. (Patient not taking: Reported on 05/02/2023), Disp: , Rfl:    halobetasol (ULTRAVATE) 0.05 % ointment, Apply 1 application topically daily as needed (psoriasis). , Disp: , Rfl:    [START ON 06/26/2023] inFLIXimab-abda (RENFLEXIS) 100 MG SOLR, Inject 100 mg into the vein every 2 (two) months., Disp: , Rfl:    leflunomide (ARAVA) 20 MG tablet, Take 20 mg by mouth daily., Disp: , Rfl:    LORazepam (ATIVAN) 1 MG tablet, TAKE ONE-HALF TO ONE TABLET BY MOUTH TWICE DAILY AS NEEDED FOR ANXIETY, Disp: 20 tablet, Rfl: 1   mupirocin ointment (BACTROBAN) 2 %, Apply 1 Application topically daily., Disp: 22 g, Rfl: 0   nitroGLYCERIN (NITROSTAT) 0.4 MG SL tablet, Place 1 tablet (0.4 mg total) under the tongue every 5 (five) minutes as needed for chest pain. 1 tablet may be used every 5 minutes as needed, for up to 15 minutes. Do not take more than 3 tablets in 15 minutes. If you do not feel better or feel worse after 1 dose, call 9-1-1 at once., Disp: 90 tablet, Rfl: 3   oxyCODONE-acetaminophen (PERCOCET) 10-325 MG tablet, Take 1 tablet by mouth 3 (three) times  daily., Disp: , Rfl:    Povidone (IVIZIA DRY EYES OP), Apply 2 drops to eye 2 (two) times daily., Disp: , Rfl:    pramoxine-hydrocortisone (ANALPRAM-HC) 1-1 % rectal cream, Place 1 application rectally 2 (two) times daily. (Patient taking differently: Place 1 application  rectally 2 (two) times daily as needed for hemorrhoids.), Disp: 30 g, Rfl: 2   promethazine (PHENERGAN) 25 MG tablet, Take 1 tablet (25 mg total) by mouth every 8 (eight) hours as needed., Disp: 30 tablet, Rfl: 3   rosuvastatin (CRESTOR) 10 MG tablet, Take 1 tablet (10 mg total) by mouth daily., Disp: 30 tablet, Rfl: 0   tiZANidine (ZANAFLEX) 2 MG  tablet, Take 2 mg by mouth 3 (three) times daily., Disp: , Rfl:    triamcinolone cream (KENALOG) 0.5 %, Apply 1 application topically 2 (two) times daily. As needed, Disp: , Rfl: 0   vitamin B-12 (CYANOCOBALAMIN) 1000 MCG tablet, Take 1,000 mcg by mouth daily., Disp: , Rfl:    vitamin C (ASCORBIC ACID) 500 MG tablet, Take 500 mg by mouth every other day., Disp: , Rfl:    zolpidem (AMBIEN) 10 MG tablet, Take 1 tablet (10 mg total) by mouth at bedtime as needed for sleep. TAKE ONE TABLET BY MOUTH ONE TIME DAILY AT BEDTIME, Disp: 90 tablet, Rfl: 1  Past Medical History: Past Medical History:  Diagnosis Date   Dysplastic nevus 06/11/2022   left posterior thigh, shave removal 08/06/2022 margins clear   Hemorrhoids    Pneumonia due to COVID-19 virus    Psoriasis    Rheumatoid arthritis (HCC)    SCC (squamous cell carcinoma) 10/08/2022   mid chest right of mid line, EDC    Tobacco Use: Social History   Tobacco Use  Smoking Status Former   Current packs/day: 0.00   Types: Cigarettes   Quit date: 11/22/1987   Years since quitting: 35.5  Smokeless Tobacco Never    Labs: Review Flowsheet  More data exists      Latest Ref Rng & Units 02/22/2016 09/11/2017 03/09/2019 07/22/2022 03/08/2023  Labs for ITP Cardiac and Pulmonary Rehab  Cholestrol 0 - 200 mg/dL 161  096  045  409  811   LDL (calc) 0 - 99 mg/dL 96  914  782  96  89   HDL-C >40 mg/dL 80  956  80  213  89   Trlycerides <150 mg/dL 91  74  086  67  62   Hemoglobin A1c 4.8 - 5.6 % - - - - 6.0     Details             Exercise Target Goals: Exercise Program Goal: Individual exercise prescription set using results from initial 6 min walk test and THRR while considering  patient's activity barriers and safety.   Exercise Prescription Goal: Initial exercise prescription builds to 30-45 minutes a day of aerobic activity, 2-3 days per week.  Home exercise guidelines will be given to patient during program as part of exercise  prescription that the participant will acknowledge.   Education: Aerobic Exercise: - Group verbal and visual presentation on the components of exercise prescription. Introduces F.I.T.T principle from ACSM for exercise prescriptions.  Reviews F.I.T.T. principles of aerobic exercise including progression. Written material given at graduation. Flowsheet Row Cardiac Rehab from 04/10/2023 in [facility] Cardiac and Pulmonary Rehab  Education need identified 04/10/23       Education: Resistance Exercise: - Group verbal and visual presentation on the components of exercise prescription. Introduces  F.I.T.T principle from ACSM for exercise prescriptions  Reviews F.I.T.T. principles of resistance exercise including progression. Written material given at graduation.    Education: Exercise & Equipment Safety: - Individual verbal instruction and demonstration of equipment use and safety with use of the equipment. Flowsheet Row Cardiac Rehab from 04/10/2023 in [facility] Cardiac and Pulmonary Rehab  Date 04/10/23  Educator NT  Instruction Review Code 1- Verbalizes Understanding       Education: Exercise Physiology & General Exercise Guidelines: - Group verbal and written instruction with models to review the exercise physiology of the cardiovascular system and associated critical values. Provides general exercise guidelines with specific guidelines to those with heart or lung disease.    Education: Flexibility, Balance, Mind/Body Relaxation: - Group verbal and visual presentation with interactive activity on the components of exercise prescription. Introduces F.I.T.T principle from ACSM for exercise prescriptions. Reviews F.I.T.T. principles of flexibility and balance exercise training including progression. Also discusses the mind body connection.  Reviews various relaxation techniques to help reduce and manage stress (i.e. Deep breathing, progressive muscle relaxation, and visualization). Balance handout provided  to take home. Written material given at graduation.   Activity Barriers & Risk Stratification:  Activity Barriers & Cardiac Risk Stratification - 03/26/23 1008       Activity Barriers & Cardiac Risk Stratification   Activity Barriers Back Problems;Left Knee Replacement;Right Knee Replacement;Neck/Spine Problems;Arthritis   hx of back surgery; right shoulder replacement   Cardiac Risk Stratification Moderate             6 Minute Walk:  6 Minute Walk     Row Name 04/10/23 1604         6 Minute Walk   Phase Initial     Distance 1610 feet     Walk Time 6 minutes     # of Rest Breaks 0     MPH 3.05     METS 3.9     RPE 9     Perceived Dyspnea  0     VO2 Peak 13.66     Symptoms Yes (comment)     Comments trouble taking a deep breath     Resting HR 77 bpm     Resting BP 128/68     Resting Oxygen Saturation  98 %     Exercise Oxygen Saturation  during 6 min walk 97 %     Max Ex. HR 132 bpm     Max Ex. BP 152/64     2 Minute Post BP 136/60              Oxygen Initial Assessment:   Oxygen Re-Evaluation:   Oxygen Discharge (Final Oxygen Re-Evaluation):   Initial Exercise Prescription:  Initial Exercise Prescription - 04/10/23 1600       Date of Initial Exercise RX and Referring Provider   Date 04/10/23    Referring Provider Dr. Yvonne Kendall, MD      Oxygen   Maintain Oxygen Saturation 88% or higher      Treadmill   MPH 2.8    Grade 1    Minutes 15    METs 3.53      NuStep   Level 3    SPM 80    Minutes 15    METs 3.9      T5 Nustep   Level 3    SPM 80    Minutes 15    METs 3.9      Prescription Details   Frequency (times  per week) 3    Duration Progress to 30 minutes of continuous aerobic without signs/symptoms of physical distress      Intensity   THRR 40-80% of Max Heartrate 105-133    Ratings of Perceived Exertion 11-13    Perceived Dyspnea 0-4      Progression   Progression Continue to progress workloads to maintain  intensity without signs/symptoms of physical distress.      Resistance Training   Training Prescription Yes    Weight 5 lb    Reps 10-15             Perform Capillary Blood Glucose checks as needed.  Exercise Prescription Changes:   Exercise Prescription Changes     Row Name 04/10/23 1600             Response to Exercise   Blood Pressure (Admit) 128/68       Blood Pressure (Exercise) 152/64       Blood Pressure (Exit) 136/60       Heart Rate (Admit) 77 bpm       Heart Rate (Exercise) 132 bpm       Heart Rate (Exit) 86 bpm       Oxygen Saturation (Admit) 98 %       Oxygen Saturation (Exercise) 97 %       Rating of Perceived Exertion (Exercise) 9       Perceived Dyspnea (Exercise) 0       Symptoms trouble taking deep breath       Comments Results                Exercise Comments:   Exercise Comments     Row Name 05/12/23 0806           Exercise Comments First full day of exercise!  Patient was oriented to gym and equipment including functions, settings, policies, and procedures.  Patient's individual exercise prescription and treatment plan were reviewed.  All starting workloads were established based on the results of the 6 minute walk test done at initial orientation visit.  The plan for exercise progression was also introduced and progression will be customized based on patient's performance and goals.                Exercise Goals and Review:   Exercise Goals     Row Name 04/10/23 1605             Exercise Goals   Increase Physical Activity Yes       Intervention Provide advice, education, support and counseling about physical activity/exercise needs.;Develop an individualized exercise prescription for aerobic and resistive training based on initial evaluation findings, risk stratification, comorbidities and participant's personal goals.       Expected Outcomes Short Term: Attend rehab on a regular basis to increase amount of physical  activity.;Long Term: Add in home exercise to make exercise part of routine and to increase amount of physical activity.;Long Term: Exercising regularly at least 3-5 days a week.       Increase Strength and Stamina Yes       Intervention Provide advice, education, support and counseling about physical activity/exercise needs.;Develop an individualized exercise prescription for aerobic and resistive training based on initial evaluation findings, risk stratification, comorbidities and participant's personal goals.       Expected Outcomes Short Term: Increase workloads from initial exercise prescription for resistance, speed, and METs.;Short Term: Perform resistance training exercises routinely during rehab and add in resistance training at  home;Long Term: Improve cardiorespiratory fitness, muscular endurance and strength as measured by increased METs and functional capacity ( )       Able to understand and use rate of perceived exertion (RPE) scale Yes       Intervention Provide education and explanation on how to use RPE scale       Expected Outcomes Short Term: Able to use RPE daily in rehab to express subjective intensity level;Long Term:  Able to use RPE to guide intensity level when exercising independently       Able to understand and use Dyspnea scale Yes       Intervention Provide education and explanation on how to use Dyspnea scale       Expected Outcomes Short Term: Able to use Dyspnea scale daily in rehab to express subjective sense of shortness of breath during exertion;Long Term: Able to use Dyspnea scale to guide intensity level when exercising independently       Knowledge and understanding of Target Heart Rate Range (THRR) Yes       Intervention Provide education and explanation of THRR including how the numbers were predicted and where they are located for reference       Expected Outcomes Short Term: Able to state/look up THRR;Long Term: Able to use THRR to govern intensity when  exercising independently;Short Term: Able to use daily as guideline for intensity in rehab       Able to check pulse independently Yes       Intervention Provide education and demonstration on how to check pulse in carotid and radial arteries.;Review the importance of being able to check your own pulse for safety during independent exercise       Expected Outcomes Short Term: Able to explain why pulse checking is important during independent exercise;Long Term: Able to check pulse independently and accurately       Understanding of Exercise Prescription Yes       Intervention Provide education, explanation, and written materials on patient's individual exercise prescription       Expected Outcomes Short Term: Able to explain program exercise prescription;Long Term: Able to explain home exercise prescription to exercise independently                Exercise Goals Re-Evaluation :  Exercise Goals Re-Evaluation     Row Name 05/12/23 0807             Exercise Goal Re-Evaluation   Exercise Goals Review Able to understand and use rate of perceived exertion (RPE) scale;Knowledge and understanding of Target Heart Rate Range (THRR);Able to understand and use Dyspnea scale;Understanding of Exercise Prescription       Comments Reviewed RPE and dyspnea scale, THR and program prescription with pt today.  Pt voiced understanding and was given a copy of goals to take home.       Expected Outcomes Short: Use RPE daily to regulate intensity. Long: Follow program prescription in THR.                Discharge Exercise Prescription (Final Exercise Prescription Changes):  Exercise Prescription Changes - 04/10/23 1600       Response to Exercise   Blood Pressure (Admit) 128/68    Blood Pressure (Exercise) 152/64    Blood Pressure (Exit) 136/60    Heart Rate (Admit) 77 bpm    Heart Rate (Exercise) 132 bpm    Heart Rate (Exit) 86 bpm    Oxygen Saturation (Admit) 98 %    Oxygen Saturation  (  Exercise) 97 %    Rating of Perceived Exertion (Exercise) 9    Perceived Dyspnea (Exercise) 0    Symptoms trouble taking deep breath    Comments Results             Nutrition:  Target Goals: Understanding of nutrition guidelines, daily intake of sodium 1500mg , cholesterol 200mg , calories 30% from fat and 7% or less from saturated fats, daily to have 5 or more servings of fruits and vegetables.  Education: All About Nutrition: -Group instruction provided by verbal, written material, interactive activities, discussions, models, and posters to present general guidelines for heart healthy nutrition including fat, fiber, MyPlate, the role of sodium in heart healthy nutrition, utilization of the nutrition label, and utilization of this knowledge for meal planning. Follow up email sent as well. Written material given at graduation. Flowsheet Row Cardiac Rehab from 04/10/2023 in [facility] Cardiac and Pulmonary Rehab  Education need identified 04/10/23       Biometrics:  Pre Biometrics - 04/10/23 1606       Pre Biometrics   Height 5\' 3"  (1.6 m)    Weight 134 lb 9.6 oz (61.1 kg)    Waist Circumference 32.5 inches    Hip Circumference 37 inches    Waist to Hip Ratio 0.88 %    BMI (Calculated) 23.85    Single Leg Stand 23.4 seconds              Nutrition Therapy Plan and Nutrition Goals:  Nutrition Therapy & Goals - 04/10/23 1611       Intervention Plan   Intervention Prescribe, educate and counsel regarding individualized specific dietary modifications aiming towards targeted core components such as weight, hypertension, lipid management, diabetes, heart failure and other comorbidities.    Expected Outcomes Short Term Goal: Understand basic principles of dietary content, such as calories, fat, sodium, cholesterol and nutrients.;Short Term Goal: A plan has been developed with personal nutrition goals set during dietitian appointment.;Long Term Goal: Adherence to prescribed  nutrition plan.             Nutrition Assessments:  MEDIFICTS Score Key: >=70 Need to make dietary changes  40-70 Heart Healthy Diet <= 40 Therapeutic Level Cholesterol Diet  Flowsheet Row Cardiac Rehab from 04/10/2023 in [facility] Cardiac and Pulmonary Rehab  Picture Your Plate Total Score on Admission 68      Picture Your Plate Scores: <16 Unhealthy dietary pattern with much room for improvement. 41-50 Dietary pattern unlikely to meet recommendations for good health and room for improvement. 51-60 More healthful dietary pattern, with some room for improvement.  >60 Healthy dietary pattern, although there may be some specific behaviors that could be improved.    Nutrition Goals Re-Evaluation:  Nutrition Goals Re-Evaluation     Row Name 05/21/23 0945             Goals   Current Weight 135 lb (61.2 kg)       Comment Patient deferred dietitian appointment                Nutrition Goals Discharge (Final Nutrition Goals Re-Evaluation):  Nutrition Goals Re-Evaluation - 05/21/23 0945       Goals   Current Weight 135 lb (61.2 kg)    Comment Patient deferred dietitian appointment             Psychosocial: Target Goals: Acknowledge presence or absence of significant depression and/or stress, maximize coping skills, provide positive support system. Participant is able to verbalize types and  ability to use techniques and skills needed for reducing stress and depression.   Education: Stress, Anxiety, and Depression - Group verbal and visual presentation to define topics covered.  Reviews how body is impacted by stress, anxiety, and depression.  Also discusses healthy ways to reduce stress and to treat/manage anxiety and depression.  Written material given at graduation.   Education: Sleep Hygiene -Provides group verbal and written instruction about how sleep can affect your health.  Define sleep hygiene, discuss sleep cycles and impact of sleep habits. Review good  sleep hygiene tips.    Initial Review & Psychosocial Screening:  Initial Psych Review & Screening - 03/26/23 1013       Initial Review   Current issues with Current Sleep Concerns;Current Stress Concerns;Current Depression      Family Dynamics   Good Support System? Yes   husband, siblings, family     Barriers   Psychosocial barriers to participate in program There are no identifiable barriers or psychosocial needs.;The patient should benefit from training in stress management and relaxation.      Screening Interventions   Interventions Encouraged to exercise;Provide feedback about the scores to participant;To provide support and resources with identified psychosocial needs    Expected Outcomes Short Term goal: Utilizing psychosocial counselor, staff and physician to assist with identification of specific Stressors or current issues interfering with healing process. Setting desired goal for each stressor or current issue identified.;Long Term Goal: Stressors or current issues are controlled or eliminated.;Short Term goal: Identification and review with participant of any Quality of Life or Depression concerns found by scoring the questionnaire.;Long Term goal: The participant improves quality of Life and PHQ9 Scores as seen by post scores and/or verbalization of changes             Quality of Life Scores:   Quality of Life - 04/10/23 1612       Quality of Life   Select Quality of Life      Quality of Life Scores   Health/Function Pre 19.6 %    Socioeconomic Pre 28.29 %    Psych/Spiritual Pre 24.86 %    Family Pre 20.4 %    GLOBAL Pre 22.59 %            Scores of 19 and below usually indicate a poorer quality of life in these areas.  A difference of  2-3 points is a clinically meaningful difference.  A difference of 2-3 points in the total score of the Quality of Life Index has been associated with significant improvement in overall quality of life, self-image, physical  symptoms, and general health in studies assessing change in quality of life.  PHQ-9: Review Flowsheet  More data exists      05/21/2023 04/10/2023 03/25/2023 11/04/2022 07/22/2022  Depression screen PHQ 2/9  Decreased Interest 2 1 0 0 0  Down, Depressed, Hopeless 1 1 0 0 1  PHQ - 2 Score 3 2 0 0 1  Altered sleeping 3 3 - 0 3  Tired, decreased energy 1 1 - 3 0  Change in appetite 2 1 - 2 0  Feeling bad or failure about yourself  0 0 - 0 0  Trouble concentrating 0 0 - 0 0  Moving slowly or fidgety/restless 0 1 - 0 0  Suicidal thoughts 0 0 - 0 0  PHQ-9 Score 9 8 - 5 4  Difficult doing work/chores Very difficult Very difficult - Not difficult at all Not difficult at all  Details           Interpretation of Total Score  Total Score Depression Severity:  1-4 = Minimal depression, 5-9 = Mild depression, 10-14 = Moderate depression, 15-19 = Moderately severe depression, 20-27 = Severe depression   Psychosocial Evaluation and Intervention:  Psychosocial Evaluation - 03/26/23 1030       Psychosocial Evaluation & Interventions   Comments Rettie is coming to cardiac rehab after stent placement. She lives a very active lifestyle and is interested in coming to the program to learn more about the best way to exercise given her recent heart event. She used to play a lot of sports and she is unable to do that with her arthritis and other joint issues, so she tries to stay busy with other outdoor activities. She recently went off her depression medication because she is tired of taking medications. She was on it after one of her daughters passed away last year. She knows the grief will always be present and she is trying to spend extra time with her other daughter who doesn't live in [place]. When she is feeling down, she states she goes outside. She doesn't like to be inside, so she will find things to occupy her time outside. Her symptoms of dyspnea and chest discomfort made her originally think it was  her lungs given her history of pneumonia,so she was surprised to find out it was her heart. 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 After her trip in October to visit her daughter, she is ready to get started in the program to learn more about heart healthy living.    Expected Outcomes Short: attend cardiac rehab for education and exercise. Long: develop and maintain positive self care habits    Continue Psychosocial Services  Follow up required by staff             Psychosocial Re-Evaluation:  Psychosocial Re-Evaluation     Row Name 05/21/23 [phone]             Psychosocial Re-Evaluation   Current issues with Current Stress Concerns       Comments Cherrish has lost her daughter over a year ago and still has some stress and sadness that comes on at times.Reviewed patient health questionnaire (PHQ-9) with patient for follow up. Previously, patients score indicated signs/symptoms of depression.  Reviewed to see if patient is improving symptom wise while in program.  Score declined and patient states that it is because of her health and her daughter being gone has made it more stressful.       Expected Outcomes Short: Continue to work toward an improvement in PHQ9 scores by attending [facility] regularly. Long: Continue to improve stress and depression coping skills by talking with staff and attending [facility] regularly and work toward a positive mental state.       Interventions Encouraged to attend Cardiac Rehabilitation for the exercise       Continue Psychosocial Services  Follow up required by staff                Psychosocial Discharge (Final Psychosocial Re-Evaluation):  Psychosocial Re-Evaluation - 05/21/23 0939       Psychosocial Re-Evaluation   Current issues with Current Stress Concerns    Comments Jakeia has lost her daughter over a year ago and still has some stress and sadness that comes on at times.Reviewed patient health questionnaire  (PHQ-9) with patient for follow up. Previously, patients  score indicated signs/symptoms of depression.  Reviewed to see if patient is improving symptom wise while in program.  Score declined and patient states that it is because of her health and her daughter being gone has made it more stressful.    Expected Outcomes Short: Continue to work toward an improvement in PHQ9 scores by attending [facility] regularly. Long: Continue to improve stress and depression coping skills by talking with staff and attending [facility] regularly and work toward a positive mental state.    Interventions Encouraged to attend Cardiac Rehabilitation for the exercise    Continue Psychosocial Services  Follow up required by staff             Vocational Rehabilitation: Provide vocational rehab assistance to qualifying candidates.   Vocational Rehab Evaluation & Intervention:  Vocational Rehab - 03/26/23 1010       Initial Vocational Rehab Evaluation & Intervention   Assessment shows need for Vocational Rehabilitation No             Education: Education Goals: Education classes will be provided on a variety of topics geared toward better understanding of heart health and risk factor modification. Participant will state understanding/return demonstration of topics presented as noted by education test scores.  Learning Barriers/Preferences:  Learning Barriers/Preferences - 03/26/23 1010       Learning Barriers/Preferences   Learning Barriers None    Learning Preferences None             General Cardiac Education Topics:  AED/CPR: - Group verbal and written instruction with the use of models to demonstrate the basic use of the AED with the basic ABC's of resuscitation.   Anatomy and Cardiac Procedures: - Group verbal and visual presentation and models provide information about basic cardiac anatomy and function. Reviews the testing methods done to diagnose heart disease and the outcomes of the  test results. Describes the treatment choices: Medical Management, Angioplasty, or Coronary Bypass Surgery for treating various heart conditions including Myocardial Infarction, Angina, Valve Disease, and Cardiac Arrhythmias.  Written material given at graduation. Flowsheet Row Cardiac Rehab from 04/10/2023 in [facility] Cardiac and Pulmonary Rehab  Education need identified 04/10/23       Medication Safety: - Group verbal and visual instruction to review commonly prescribed medications for heart and lung disease. Reviews the medication, class of the drug, and side effects. Includes the steps to properly store meds and maintain the prescription regimen.  Written material given at graduation.   Intimacy: - Group verbal instruction through game format to discuss how heart and lung disease can affect sexual intimacy. Written material given at graduation..   Know Your Numbers and Heart Failure: - Group verbal and visual instruction to discuss disease risk factors for cardiac and pulmonary disease and treatment options.  Reviews associated critical values for Overweight/Obesity, Hypertension, Cholesterol, and Diabetes.  Discusses basics of heart failure: signs/symptoms and treatments.  Introduces Heart Failure Zone chart for action plan for heart failure.  Written material given at graduation.   Infection Prevention: - Provides verbal and written material to individual with discussion of infection control including proper hand washing and proper equipment cleaning during exercise session. Flowsheet Row Cardiac Rehab from 04/10/2023 in [facility] Cardiac and Pulmonary Rehab  Date 04/10/23  Educator NT  Instruction Review Code 1- Verbalizes Understanding       Falls Prevention: - Provides verbal and written material to individual with discussion of falls prevention and safety. Flowsheet Row Cardiac Rehab from 04/10/2023 in [facility] Cardiac and  Pulmonary Rehab  Date 04/10/23  Educator NT  Instruction Review  Code 1- Verbalizes Understanding       Other: -Provides group and verbal instruction on various topics (see comments)   Knowledge Questionnaire Score:  Knowledge Questionnaire Score - 04/10/23 1610       Knowledge Questionnaire Score   Pre Score 20/26             Core Components/Risk Factors/Patient Goals at Admission:  Personal Goals and Risk Factors at Admission - 03/26/23 1009       Core Components/Risk Factors/Patient Goals on Admission   Hypertension Yes    Intervention Provide education on lifestyle modifcations including regular physical activity/exercise, weight management, moderate sodium restriction and increased consumption of fresh fruit, vegetables, and low fat dairy, alcohol moderation, and smoking cessation.;Monitor prescription use compliance.    Expected Outcomes Short Term: Continued assessment and intervention until BP is < 140/70mm HG in hypertensive participants. < 130/15mm HG in hypertensive participants with diabetes, heart failure or chronic kidney disease.;Long Term: Maintenance of blood pressure at goal levels.    Lipids Yes    Intervention Provide education and support for participant on nutrition & aerobic/resistive exercise along with prescribed medications to achieve LDL 70mg , HDL >40mg .    Expected Outcomes Short Term: Participant states understanding of desired cholesterol values and is compliant with medications prescribed. Participant is following exercise prescription and nutrition guidelines.;Long Term: Cholesterol controlled with medications as prescribed, with individualized exercise RX and with personalized nutrition plan. Value goals: LDL < 70mg , HDL > 40 mg.             Education:Diabetes - Individual verbal and written instruction to review signs/symptoms of diabetes, desired ranges of glucose level fasting, after meals and with exercise. Acknowledge that pre and post exercise glucose checks will be done for 3 sessions at entry of  program.   Core Components/Risk Factors/Patient Goals Review:   Goals and Risk Factor Review     Row Name 05/21/23 0948             Core Components/Risk Factors/Patient Goals Review   Personal Goals Review Other       Review Syble states that she is going to continue rehab but is not sure if she is going to continue the whole program. She feels like she is doing well and would do exercise at home. Informed her to tell staff when she feels like she wants to be done. She states she would do the program at least until the end of November.       Expected Outcomes Short: continue exercise through November Long: exercise at home independently.                Core Components/Risk Factors/Patient Goals at Discharge (Final Review):   Goals and Risk Factor Review - 05/21/23 0948       Core Components/Risk Factors/Patient Goals Review   Personal Goals Review Other    Review Cylinda states that she is going to continue rehab but is not sure if she is going to continue the whole program. She feels like she is doing well and would do exercise at home. Informed her to tell staff when she feels like she wants to be done. She states she would do the program at least until the end of November.    Expected Outcomes Short: continue exercise through November Long: exercise at home independently.             ITP Comments:  ITP Comments     Row Name 03/26/23 1010 04/10/23 1557 05/07/23 0854 05/12/23 0806 05/28/23 0933   ITP Comments Initial phone call completed. Diagnosis can be found in [facility] 8/30. EP Orientation scheduled for 10/3 at 2:30. Completed and gym orientation. Initial ITP created and sent for review to Dr. Bethann Punches, Medical Director. 30 Day review completed. Medical Director ITP review done, changes made as directed, and signed approval by Medical Director.   new to program First full day of exercise!  Patient was oriented to gym and equipment including functions, settings, policies,  and procedures.  Patient's individual exercise prescription and treatment plan were reviewed.  All starting workloads were established based on the results of the 6 minute walk test done at initial orientation visit.  The plan for exercise progression was also introduced and progression will be customized based on patient's performance and goals. 30 Day review completed. Medical Director ITP review done, changes made as directed, and signed approval by Medical Director. new to program            Comments:

## 2023-05-30 ENCOUNTER — Encounter: Payer: Medicare Other | Admitting: *Deleted

## 2023-05-30 DIAGNOSIS — Z955 Presence of coronary angioplasty implant and graft: Secondary | ICD-10-CM | POA: Diagnosis not present

## 2023-05-30 NOTE — Progress Notes (Signed)
Daily Session Note  Patient Details  Name: JAMIEANN WASSENAAR MRN: [id number] Date of Birth: Jun 11, 1951 Referring Provider:   Flowsheet Row Cardiac Rehab from 04/10/2023 in [facility] Cardiac and Pulmonary Rehab  Referring Provider Dr. Yvonne Kendall, MD       Encounter Date: 05/30/2023  Check In:  Session Check In - 05/30/23 0954       Check-In   Supervising physician immediately available to respond to emergencies See telemetry face sheet for immediately available ER MD    Location [facility]-Cardiac & Pulmonary Rehab    Staff Present Cora Collum, RN, BSN, CCRP;Noah Tickle, BS, Exercise Physiologist;Joseph Stanton, [place]    Virtual Visit No    Medication changes reported     No    Fall or balance concerns reported    No    Warm-up and Cool-down Performed on first and last piece of equipment    Resistance Training Performed Yes    VAD Patient? No    PAD/SET Patient? No      Pain Assessment   Currently in Pain? No/denies                Social History   Tobacco Use  Smoking Status Former   Current packs/day: 0.00   Types: Cigarettes   Quit date: 11/22/1987   Years since quitting: 35.5  Smokeless Tobacco Never    Goals Met:  Independence with exercise equipment Exercise tolerated well No report of concerns or symptoms today  Goals Unmet:  Not Applicable  Comments: Pt able to follow exercise prescription today without complaint.  Will continue to monitor for progression.    Dr. Bethann Punches is Medical Director for [facility] Cardiac Rehabilitation.  Dr. Vida Rigger is Medical Director for [facility] Pulmonary Rehabilitation.

## 2023-06-02 ENCOUNTER — Encounter: Payer: Medicare Other | Admitting: *Deleted

## 2023-06-02 DIAGNOSIS — Z955 Presence of coronary angioplasty implant and graft: Secondary | ICD-10-CM | POA: Diagnosis not present

## 2023-06-02 NOTE — Progress Notes (Signed)
Daily Session Note  Patient Details  Name: Meredith Russell MRN: [id number] Date of Birth: 1950/12/09 Referring Provider:   Flowsheet Row Cardiac Rehab from 04/10/2023 in [facility] Cardiac and Pulmonary Rehab  Referring Provider Dr. Yvonne Kendall, MD       Encounter Date: 06/02/2023  Check In:  Session Check In - 06/02/23 0924       Check-In   Supervising physician immediately available to respond to emergencies See telemetry face sheet for immediately available ER MD    Location [facility]-Cardiac & Pulmonary Rehab    Staff Present Rory Percy, MS, Exercise Physiologist;Susanne Bice, RN, BSN, CCRP;Maxon Conetta BS, , Exercise Physiologist;Shelle Galdamez Katrinka Blazing, RN, ADN    Virtual Visit No    Medication changes reported     No    Fall or balance concerns reported    No    Warm-up and Cool-down Performed on first and last piece of equipment    Resistance Training Performed Yes    VAD Patient? No    PAD/SET Patient? No      Pain Assessment   Currently in Pain? No/denies                Social History   Tobacco Use  Smoking Status Former   Current packs/day: 0.00   Types: Cigarettes   Quit date: 11/22/1987   Years since quitting: 35.5  Smokeless Tobacco Never    Goals Met:  Independence with exercise equipment Exercise tolerated well No report of concerns or symptoms today Strength training completed today  Goals Unmet:  Not Applicable  Comments: Pt able to follow exercise prescription today without complaint.  Will continue to monitor for progression.    Dr. Bethann Punches is Medical Director for [facility] Cardiac Rehabilitation.  Dr. Vida Rigger is Medical Director for [facility] Pulmonary Rehabilitation.

## 2023-06-04 ENCOUNTER — Encounter: Payer: Medicare Other | Admitting: *Deleted

## 2023-06-04 DIAGNOSIS — Z955 Presence of coronary angioplasty implant and graft: Secondary | ICD-10-CM

## 2023-06-04 NOTE — Progress Notes (Signed)
Daily Session Note  Patient Details  Name: Meredith Russell MRN: [id number] Date of Birth: 05/07/1951 Referring Provider:   Flowsheet Row Cardiac Rehab from 04/10/2023 in [facility] Cardiac and Pulmonary Rehab  Referring Provider Dr. Yvonne Kendall, MD       Encounter Date: 06/04/2023  Check In:  Session Check In - 06/04/23 0913       Check-In   Supervising physician immediately available to respond to emergencies See telemetry face sheet for immediately available ER MD    Location [facility]-Cardiac & Pulmonary Rehab    Staff Present Rory Percy, MS, Exercise Physiologist;Maxon Conetta BS, , Exercise Physiologist;Nickey Canedo Katrinka Blazing, RN, ADN    Virtual Visit No    Medication changes reported     No    Fall or balance concerns reported    No    Warm-up and Cool-down Performed on first and last piece of equipment    Resistance Training Performed Yes    VAD Patient? No    PAD/SET Patient? No      Pain Assessment   Currently in Pain? No/denies                Social History   Tobacco Use  Smoking Status Former   Current packs/day: 0.00   Types: Cigarettes   Quit date: 11/22/1987   Years since quitting: 35.5  Smokeless Tobacco Never    Goals Met:  Independence with exercise equipment Exercise tolerated well No report of concerns or symptoms today Strength training completed today  Goals Unmet:  Not Applicable  Comments: Pt able to follow exercise prescription today without complaint.  Will continue to monitor for progression.    Dr. Bethann Punches is Medical Director for [facility] Cardiac Rehabilitation.  Dr. Vida Rigger is Medical Director for [facility] Pulmonary Rehabilitation.

## 2023-06-08 ENCOUNTER — Other Ambulatory Visit: Payer: Self-pay | Admitting: Cardiology

## 2023-06-09 ENCOUNTER — Encounter: Payer: Medicare Other | Attending: Internal Medicine | Admitting: *Deleted

## 2023-06-09 DIAGNOSIS — Z955 Presence of coronary angioplasty implant and graft: Secondary | ICD-10-CM | POA: Insufficient documentation

## 2023-06-09 DIAGNOSIS — Z48812 Encounter for surgical aftercare following surgery on the circulatory system: Secondary | ICD-10-CM | POA: Insufficient documentation

## 2023-06-09 NOTE — Telephone Encounter (Signed)
last visit 05/02/23 with plan to follow up in 3 months  next visit:  08/05/23

## 2023-06-09 NOTE — Progress Notes (Signed)
Daily Session Note  Patient Details  Name: Meredith Russell MRN: [id number] Date of Birth: 01-Nov-1950 Referring Provider:   Flowsheet Row Cardiac Rehab from 04/10/2023 in [facility] Cardiac and Pulmonary Rehab  Referring Provider Dr. Yvonne Kendall, MD       Encounter Date: 06/09/2023  Check In:  Session Check In - 06/09/23 0858       Check-In   Supervising physician immediately available to respond to emergencies See telemetry face sheet for immediately available ER MD    Location [facility]-Cardiac & Pulmonary Rehab    Staff Present Hulen Luster, BS, RRT, CPFT;Kelly Madilyn Fireman, BS, ACSM CEP, Exercise Physiologist;Annalissa Murphey Katrinka Blazing, RN, ADN    Virtual Visit No    Medication changes reported     No    Fall or balance concerns reported    No    Warm-up and Cool-down Performed on first and last piece of equipment    Resistance Training Performed Yes    VAD Patient? No    PAD/SET Patient? No      Pain Assessment   Currently in Pain? No/denies                Social History   Tobacco Use  Smoking Status Former   Current packs/day: 0.00   Types: Cigarettes   Quit date: 11/22/1987   Years since quitting: 35.5  Smokeless Tobacco Never    Goals Met:  Independence with exercise equipment Exercise tolerated well No report of concerns or symptoms today Strength training completed today  Goals Unmet:  Not Applicable  Comments: Pt able to follow exercise prescription today without complaint.  Will continue to monitor for progression.    Dr. Bethann Punches is Medical Director for [facility] Cardiac Rehabilitation.  Dr. Vida Rigger is Medical Director for [facility] Pulmonary Rehabilitation.

## 2023-06-11 ENCOUNTER — Other Ambulatory Visit: Payer: Self-pay | Admitting: Family Medicine

## 2023-06-11 ENCOUNTER — Encounter: Payer: Medicare Other | Admitting: *Deleted

## 2023-06-11 ENCOUNTER — Other Ambulatory Visit: Payer: Self-pay | Admitting: *Deleted

## 2023-06-11 DIAGNOSIS — Z955 Presence of coronary angioplasty implant and graft: Secondary | ICD-10-CM

## 2023-06-11 DIAGNOSIS — Z1231 Encounter for screening mammogram for malignant neoplasm of breast: Secondary | ICD-10-CM

## 2023-06-11 MED ORDER — CLOPIDOGREL BISULFATE 75 MG PO TABS: 75.0000 mg | ORAL_TABLET | Freq: Every day | ORAL | 3 refills | Status: DC

## 2023-06-11 NOTE — Progress Notes (Signed)
Daily Session Note  Patient Details  Name: Meredith Russell MRN: [id number] Date of Birth: [birth year] Referring Provider:   Flowsheet Row Cardiac Rehab from 04/10/2023 in [facility] Cardiac and Pulmonary Rehab  Referring Provider Dr. Yvonne Kendall, MD       Encounter Date: 06/11/2023  Check In:  Session Check In - 06/11/23 0930       Check-In   Supervising physician immediately available to respond to emergencies See telemetry face sheet for immediately available ER MD    Location [facility]-Cardiac & Pulmonary Rehab    Staff Present Rory Percy, MS, Exercise Physiologist;Joseph Reino Kent, RCP,RRT,BSRT;Maxon Osmond BS, , Exercise Physiologist;Fartun Paradiso Katrinka Blazing, RN, ADN    Virtual Visit No    Medication changes reported     No    Fall or balance concerns reported    No    Warm-up and Cool-down Performed on first and last piece of equipment    Resistance Training Performed Yes    VAD Patient? No    PAD/SET Patient? No      Pain Assessment   Currently in Pain? No/denies                Social History   Tobacco Use  Smoking Status Former   Current packs/day: 0.00   Types: Cigarettes   Quit date: 11/22/1987   Years since quitting: 35.5  Smokeless Tobacco Never    Goals Met:  Independence with exercise equipment Exercise tolerated well No report of concerns or symptoms today Strength training completed today  Goals Unmet:  Not Applicable  Comments: Pt able to follow exercise prescription today without complaint.  Will continue to monitor for progression.    Dr. Bethann Punches is Medical Director for [facility] Cardiac Rehabilitation.  Dr. Vida Rigger is Medical Director for [facility] Pulmonary Rehabilitation.

## 2023-06-12 ENCOUNTER — Ambulatory Visit: Payer: Medicare Other | Admitting: Dermatology

## 2023-06-12 ENCOUNTER — Encounter: Payer: Medicare Other | Admitting: Dermatology

## 2023-06-12 DIAGNOSIS — Z85828 Personal history of other malignant neoplasm of skin: Secondary | ICD-10-CM

## 2023-06-12 DIAGNOSIS — Z86018 Personal history of other benign neoplasm: Secondary | ICD-10-CM

## 2023-06-12 DIAGNOSIS — L814 Other melanin hyperpigmentation: Secondary | ICD-10-CM | POA: Diagnosis not present

## 2023-06-12 DIAGNOSIS — L578 Other skin changes due to chronic exposure to nonionizing radiation: Secondary | ICD-10-CM | POA: Diagnosis not present

## 2023-06-12 DIAGNOSIS — L570 Actinic keratosis: Secondary | ICD-10-CM | POA: Diagnosis not present

## 2023-06-12 DIAGNOSIS — Z79899 Other long term (current) drug therapy: Secondary | ICD-10-CM

## 2023-06-12 DIAGNOSIS — L820 Inflamed seborrheic keratosis: Secondary | ICD-10-CM | POA: Diagnosis not present

## 2023-06-12 DIAGNOSIS — Z1283 Encounter for screening for malignant neoplasm of skin: Secondary | ICD-10-CM

## 2023-06-12 DIAGNOSIS — D1801 Hemangioma of skin and subcutaneous tissue: Secondary | ICD-10-CM

## 2023-06-12 DIAGNOSIS — W908XXA Exposure to other nonionizing radiation, initial encounter: Secondary | ICD-10-CM | POA: Diagnosis not present

## 2023-06-12 DIAGNOSIS — L821 Other seborrheic keratosis: Secondary | ICD-10-CM

## 2023-06-12 DIAGNOSIS — Z7189 Other specified counseling: Secondary | ICD-10-CM

## 2023-06-12 DIAGNOSIS — D229 Melanocytic nevi, unspecified: Secondary | ICD-10-CM

## 2023-06-12 DIAGNOSIS — Z8589 Personal history of malignant neoplasm of other organs and systems: Secondary | ICD-10-CM

## 2023-06-12 DIAGNOSIS — L409 Psoriasis, unspecified: Secondary | ICD-10-CM

## 2023-06-12 MED ORDER — WYNZORA 0.005-0.064 % EX CREA: TOPICAL_CREAM | CUTANEOUS | 5 refills | Status: AC

## 2023-06-12 MED ORDER — HALOBETASOL PROPIONATE 0.05 % EX OINT: 1.0000 | TOPICAL_OINTMENT | Freq: Every day | CUTANEOUS | 5 refills | Status: AC | PRN

## 2023-06-12 NOTE — Progress Notes (Signed)
Follow-Up Visit   Subjective  Meredith Russell is a 72 y.o. female who presents for the following: Skin Cancer Screening and Full Body Skin Exam  The patient presents for Total-Body Skin Exam (TBSE) for skin cancer screening and mole check. The patient has spots, moles and lesions to be evaluated, some may be new or changing and the patient may have concern these could be cancer.  The following portions of the chart were reviewed this encounter and updated as appropriate: medications, allergies, medical history  Review of Systems:  No other skin or systemic complaints except as noted in HPI or Assessment and Plan.  Objective  Well appearing patient in no apparent distress; mood and affect are within normal limits.  A full examination was performed including scalp, head, eyes, ears, nose, lips, neck, chest, axillae, abdomen, back, buttocks, bilateral upper extremities, bilateral lower extremities, hands, feet, fingers, toes, fingernails, and toenails. All findings within normal limits unless otherwise noted below.   Relevant physical exam findings are noted in the Assessment and Plan.  L temple x 1, L scapula x 1, R forearm x 1 (3) Erythematous stuck-on, waxy papule or plaque  R nose x 1, L cheek x 2 (3) Erythematous thin papules/macules with gritty scale.     Assessment & Plan   SKIN CANCER SCREENING PERFORMED TODAY.  ACTINIC DAMAGE - Chronic condition, secondary to cumulative UV/sun exposure - diffuse scaly erythematous macules with underlying dyspigmentation - Recommend daily broad spectrum sunscreen SPF 30+ to sun-exposed areas, reapply every 2 hours as needed.  - Staying in the shade or wearing long sleeves, sun glasses (UVA+UVB protection) and wide brim hats (4-inch brim around the entire circumference of the hat) are also recommended for sun protection.  - Call for new or changing lesions.  LENTIGINES, SEBORRHEIC KERATOSES, HEMANGIOMAS - Benign normal skin lesions -  Benign-appearing - Call for any changes  MELANOCYTIC NEVI - Tan-brown and/or pink-flesh-colored symmetric macules and papules - Benign appearing on exam today - Observation - Call clinic for new or changing moles - Recommend daily use of broad spectrum spf 30+ sunscreen to sun-exposed areas.   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  Call if any new or changing lesions are noted between office visits  HISTORY OF SQUAMOUS CELL CARCINOMA OF THE SKIN - No evidence of recurrence today - No lymphadenopathy - Recommend regular full body skin exams - Recommend daily broad spectrum sunscreen SPF 30+ to sun-exposed areas, reapply every 2 hours as needed.  - Call if any new or changing lesions are noted between office visits  PSORIASIS - hands, feet, and scalp Exam: Some thickening and scale of the feet, 2% BSA.  Chronic and persistent condition with duration or expected duration over one year. Condition is improving with treatment but not currently at goal.  Hx of arthritis, pt being followed by rheumatology and prescribed Remicade infusions.  Psoriasis is a chronic non-curable, but treatable genetic/hereditary disease that may have other systemic features affecting other organ systems such as joints (Psoriatic Arthritis). It is associated with an increased risk of inflammatory bowel disease, heart disease, non-alcoholic fatty liver disease, and depression.  Treatments include light and laser treatments; topical medications; and systemic medications including oral and injectables.  Treatment Plan: Continue Wynzora cream QD-BID PRN and Halobetasol ointment to aa's QD-BID PRN.   Inflamed seborrheic keratosis (  3) L temple x 1, L scapula x 1, R forearm x 1  Symptomatic, irritating, patient would like treated.   Destruction of  lesion - L temple x 1, L scapula x 1, R forearm x 1 (3) Complexity: simple   Destruction method: cryotherapy   Informed consent: discussed and consent obtained   Timeout:  patient name, date of birth, surgical site, and procedure verified Lesion destroyed using liquid nitrogen: Yes   Region frozen until ice ball extended beyond lesion: Yes   Outcome: patient tolerated procedure well with no complications   Post-procedure details: wound care instructions given    AK (actinic keratosis) (3) R nose x 1, L cheek x 2  Prior to procedure, discussed risks of blister formation, small wound, skin dyspigmentation, or rare scar following cryotherapy. Recommend Vaseline ointment to treated areas while healing.  Actinic keratoses are precancerous spots that appear secondary to cumulative UV radiation exposure/sun exposure over time. They are chronic with expected duration over 1 year. A portion of actinic keratoses will progress to squamous cell carcinoma of the skin. It is not possible to reliably predict which spots will progress to skin cancer and so treatment is recommended to prevent development of skin cancer.  Recommend daily broad spectrum sunscreen SPF 30+ to sun-exposed areas, reapply every 2 hours as needed.  Recommend staying in the shade or wearing long sleeves, sun glasses (UVA+UVB protection) and wide brim hats (4-inch brim around the entire circumference of the hat). Call for new or changing lesions.  Patient prefers to call if these do not clear after treatment rather than schedule an AK follow-up at this time   Destruction of lesion - R nose x 1, L cheek x 2 (3) Complexity: simple   Destruction method: cryotherapy   Informed consent: discussed and consent obtained   Timeout:  patient name, date of birth, surgical site, and procedure verified Lesion destroyed using liquid nitrogen: Yes   Region frozen until ice ball extended beyond lesion: Yes   Outcome: patient tolerated procedure  well with no complications   Post-procedure details: wound care instructions given     Return for TBSE in 1.5 years.  Meredith Russell, CMA, am acting as scribe for Armida Sans, MD .   Documentation: I have reviewed the above documentation for accuracy and completeness, and I agree with the above.  Armida Sans, MD

## 2023-06-12 NOTE — Patient Instructions (Signed)

## 2023-06-13 ENCOUNTER — Encounter: Payer: Medicare Other | Admitting: *Deleted

## 2023-06-13 DIAGNOSIS — Z955 Presence of coronary angioplasty implant and graft: Secondary | ICD-10-CM | POA: Diagnosis not present

## 2023-06-13 NOTE — Progress Notes (Signed)
Daily Session Note  Patient Details  Name: Meredith Russell MRN: [id number] Date of Birth: June 05, 1951 Referring Provider:   Flowsheet Row Cardiac Rehab from 04/10/2023 in [facility] Cardiac and Pulmonary Rehab  Referring Provider Dr. Yvonne Kendall, MD       Encounter Date: 06/13/2023  Check In:  Session Check In - 06/13/23 0943       Check-In   Supervising physician immediately available to respond to emergencies See telemetry face sheet for immediately available ER MD    Location [facility]-Cardiac & Pulmonary Rehab    Staff Present Cora Collum, RN, BSN, CCRP;Joseph Hood, RCP,RRT,BSRT;Noah Tickle, [place], Exercise Physiologist    Virtual Visit No    Medication changes reported     No    Fall or balance concerns reported    No    Warm-up and Cool-down Performed on first and last piece of equipment    Resistance Training Performed Yes    VAD Patient? No    PAD/SET Patient? No      Pain Assessment   Currently in Pain? No/denies                Social History   Tobacco Use  Smoking Status Former   Current packs/day: 0.00   Types: Cigarettes   Quit date: 11/22/1987   Years since quitting: 35.5  Smokeless Tobacco Never    Goals Met:  Independence with exercise equipment Exercise tolerated well No report of concerns or symptoms today  Goals Unmet:  Not Applicable  Comments: Pt able to follow exercise prescription today without complaint.  Will continue to monitor for progression.    Dr. Bethann Punches is Medical Director for [facility] Cardiac Rehabilitation.  Dr. Vida Rigger is Medical Director for [facility] Pulmonary Rehabilitation.

## 2023-06-16 ENCOUNTER — Encounter: Payer: Medicare Other | Admitting: *Deleted

## 2023-06-16 DIAGNOSIS — Z955 Presence of coronary angioplasty implant and graft: Secondary | ICD-10-CM

## 2023-06-16 NOTE — Progress Notes (Signed)
Daily Session Note  Patient Details  Name: Meredith Russell MRN: [id number] Date of Birth: January 25, 1951 Referring Provider:   Flowsheet Row Cardiac Rehab from 04/10/2023 in [facility] Cardiac and Pulmonary Rehab  Referring Provider Dr. Yvonne Kendall, MD       Encounter Date: 06/16/2023  Check In:  Session Check In - 06/16/23 0926       Check-In   Supervising physician immediately available to respond to emergencies See telemetry face sheet for immediately available ER MD    Location [facility]-Cardiac & Pulmonary Rehab    Staff Present Cora Collum, RN, BSN, CCRP;Margaret Best, MS, Exercise Physiologist;Maxon Conetta BS, , Exercise Physiologist;Farrah Skoda Katrinka Blazing, RN, ADN    Virtual Visit No    Medication changes reported     No    Fall or balance concerns reported    No    Warm-up and Cool-down Performed on first and last piece of equipment    Resistance Training Performed Yes    VAD Patient? No    PAD/SET Patient? No      Pain Assessment   Currently in Pain? No/denies                Social History   Tobacco Use  Smoking Status Former   Current packs/day: 0.00   Types: Cigarettes   Quit date: 11/22/1987   Years since quitting: 35.5  Smokeless Tobacco Never    Goals Met:  Independence with exercise equipment Exercise tolerated well No report of concerns or symptoms today Strength training completed today  Goals Unmet:  Not Applicable  Comments: Pt able to follow exercise prescription today without complaint.  Will continue to monitor for progression.    Dr. Bethann Punches is Medical Director for [facility] Cardiac Rehabilitation.  Dr. Vida Rigger is Medical Director for [facility] Pulmonary Rehabilitation.

## 2023-06-17 ENCOUNTER — Encounter: Payer: Self-pay | Admitting: Dermatology

## 2023-06-18 ENCOUNTER — Encounter: Payer: Medicare Other | Admitting: *Deleted

## 2023-06-18 DIAGNOSIS — Z955 Presence of coronary angioplasty implant and graft: Secondary | ICD-10-CM | POA: Diagnosis not present

## 2023-06-18 NOTE — Progress Notes (Signed)
Daily Session Note  Patient Details  Name: Meredith Russell MRN: [id number] Date of Birth: June 02, 1951 Referring Provider:   Flowsheet Row Cardiac Rehab from 04/10/2023 in [facility] Cardiac and Pulmonary Rehab  Referring Provider Dr. Yvonne Kendall, MD       Encounter Date: 06/18/2023  Check In:  Session Check In - 06/18/23 0919       Check-In   Supervising physician immediately available to respond to emergencies See telemetry face sheet for immediately available ER MD    Location [facility]-Cardiac & Pulmonary Rehab    Staff Present Cora Collum, RN, BSN, CCRP;Noah Tickle, BS, Exercise Physiologist;Maxon Conetta BS, , Exercise Physiologist;Elicia Lui Katrinka Blazing, RN, ADN    Virtual Visit No    Medication changes reported     No    Fall or balance concerns reported    No    Warm-up and Cool-down Performed on first and last piece of equipment    Resistance Training Performed Yes    VAD Patient? No    PAD/SET Patient? No      Pain Assessment   Currently in Pain? No/denies                Social History   Tobacco Use  Smoking Status Former   Current packs/day: 0.00   Types: Cigarettes   Quit date: 11/22/1987   Years since quitting: 35.5  Smokeless Tobacco Never    Goals Met:  Independence with exercise equipment Exercise tolerated well No report of concerns or symptoms today Strength training completed today  Goals Unmet:  Not Applicable  Comments: Pt able to follow exercise prescription today without complaint.  Will continue to monitor for progression.    Dr. Bethann Punches is Medical Director for [facility] Cardiac Rehabilitation.  Dr. Vida Rigger is Medical Director for [facility] Pulmonary Rehabilitation.

## 2023-06-20 ENCOUNTER — Encounter: Payer: Medicare Other | Admitting: *Deleted

## 2023-06-20 DIAGNOSIS — Z955 Presence of coronary angioplasty implant and graft: Secondary | ICD-10-CM | POA: Diagnosis not present

## 2023-06-20 NOTE — Progress Notes (Signed)
Daily Session Note  Patient Details  Name: ANNAH SLAPPEY MRN: [id number] Date of Birth: 30-Dec-1950 Referring Provider:   Flowsheet Row Cardiac Rehab from 04/10/2023 in [facility] Cardiac and Pulmonary Rehab  Referring Provider Dr. Yvonne Kendall, MD       Encounter Date: 06/20/2023  Check In:  Session Check In - 06/20/23 1100       Check-In   Supervising physician immediately available to respond to emergencies See telemetry face sheet for immediately available ER MD    Location [facility]-Cardiac & Pulmonary Rehab    Staff Present Ronette Deter, BS, Exercise Physiologist;Joseph Shelbie Proctor, RN, [place]    Virtual Visit No    Medication changes reported     No    Fall or balance concerns reported    No    Warm-up and Cool-down Performed on first and last piece of equipment    Resistance Training Performed Yes    VAD Patient? No    PAD/SET Patient? No      Pain Assessment   Currently in Pain? No/denies                Social History   Tobacco Use  Smoking Status Former   Current packs/day: 0.00   Types: Cigarettes   Quit date: 11/22/1987   Years since quitting: 35.6  Smokeless Tobacco Never    Goals Met:  Independence with exercise equipment Exercise tolerated well No report of concerns or symptoms today Strength training completed today  Goals Unmet:  Not Applicable  Comments: Pt able to follow exercise prescription today without complaint.  Will continue to monitor for progression.    Dr. Bethann Punches is Medical Director for [facility] Cardiac Rehabilitation.  Dr. Vida Rigger is Medical Director for [facility] Pulmonary Rehabilitation.

## 2023-06-23 ENCOUNTER — Encounter: Payer: Medicare Other | Admitting: *Deleted

## 2023-06-23 DIAGNOSIS — Z955 Presence of coronary angioplasty implant and graft: Secondary | ICD-10-CM | POA: Diagnosis not present

## 2023-06-23 NOTE — Progress Notes (Signed)
Daily Session Note  Patient Details  Name: Meredith Russell MRN: [id number] Date of Birth: September 29, 1950 Referring Provider:   Flowsheet Row Cardiac Rehab from 04/10/2023 in [facility] Cardiac and Pulmonary Rehab  Referring Provider Dr. Yvonne Kendall, MD       Encounter Date: 06/23/2023  Check In:  Session Check In - 06/23/23 0909       Check-In   Supervising physician immediately available to respond to emergencies See telemetry face sheet for immediately available ER MD    Location [facility]-Cardiac & Pulmonary Rehab    Staff Present Rory Percy, MS, Exercise Physiologist;Maxon Suzzette Righter, , Exercise Physiologist;Kelly Madilyn Fireman, BS, ACSM CEP, Exercise Physiologist;Cashius Grandstaff Katrinka Blazing, RN, ADN    Virtual Visit No    Medication changes reported     No    Fall or balance concerns reported    No    Warm-up and Cool-down Performed on first and last piece of equipment    Resistance Training Performed Yes    VAD Patient? No    PAD/SET Patient? No      Pain Assessment   Currently in Pain? No/denies                Social History   Tobacco Use  Smoking Status Former   Current packs/day: 0.00   Types: Cigarettes   Quit date: 11/22/1987   Years since quitting: 35.6  Smokeless Tobacco Never    Goals Met:  Independence with exercise equipment Exercise tolerated well No report of concerns or symptoms today Strength training completed today  Goals Unmet:  Not Applicable  Comments: Pt able to follow exercise prescription today without complaint.  Will continue to monitor for progression.   Reviewed home exercise with pt today.  Pt plans to walk, use her TM, and stationary bike  for exercise.  Reviewed THR, pulse, RPE, sign and symptoms, pulse oximetery and when to call 911 or MD.  Also discussed weather considerations and indoor options.  Pt voiced understanding.   Dr. Bethann Punches is Medical Director for [facility] Cardiac Rehabilitation.  Dr. Vida Rigger is Medical Director for  [facility] Pulmonary Rehabilitation.

## 2023-06-25 ENCOUNTER — Encounter: Payer: Self-pay | Admitting: *Deleted

## 2023-06-25 ENCOUNTER — Encounter: Payer: Medicare Other | Admitting: *Deleted

## 2023-06-25 DIAGNOSIS — Z955 Presence of coronary angioplasty implant and graft: Secondary | ICD-10-CM

## 2023-06-25 NOTE — Progress Notes (Signed)
Cardiac Individual Treatment Plan  Patient Details  Name: Meredith Russell MRN: [id number] Date of Birth: [birth year] Referring Provider:   Flowsheet Row Cardiac Rehab from 04/10/2023 in [facility] Cardiac and Pulmonary Rehab  Referring Provider Dr. Yvonne Kendall, MD       Initial Encounter Date:  Flowsheet Row Cardiac Rehab from 04/10/2023 in [facility] Cardiac and Pulmonary Rehab  Date 04/10/23       Visit Diagnosis: Status post coronary artery stent placement  Patient's Home Medications on Admission:  Current Outpatient Medications:    amLODipine (NORVASC) 10 MG tablet, TAKE 1 TABLET BY MOUTH DAILY, Disp: 90 tablet, Rfl: 2   aspirin EC 81 MG tablet, Take 1 tablet (81 mg total) by mouth daily. Swallow whole., Disp: , Rfl:    azelastine (ASTELIN) 0.1 % nasal spray, Place 2 sprays into both nostrils 2 (two) times daily. Use in each nostril as directed, Disp: 30 mL, Rfl: 5   butalbital-acetaminophen-caffeine (FIORICET) 50-325-40 MG tablet, TAKE 1-2 TABLET BY MOUTH EVERY 6 (SIX) HOURS AS NEEDED FOR HEADACHE., Disp: 30 tablet, Rfl: 5   calcipotriene (DOVONOX) 0.005 % cream, Apply 1 application  topically 3 (three) times daily as needed (psoriasis). (Patient not taking: Reported on 06/12/2023), Disp: , Rfl:    Calcipotriene-Betameth Diprop (WYNZORA) 0.005-0.064 % CREA, Apply to aa's psoriasis BID PRN., Disp: 60 g, Rfl: 5   Cholecalciferol (VITAMIN D3) 1000 units CAPS, Take 1,000 Units by mouth daily. , Disp: , Rfl:    clopidogrel (PLAVIX) 75 MG tablet, Take 1 tablet (75 mg total) by mouth daily. Loading dose of 300 mg (4 tablets) morning of 05/03/23 then 75 mg (1 tablet) thereafter, Disp: 94 tablet, Rfl: 3   cycloSPORINE (RESTASIS) 0.05 % ophthalmic emulsion, Place 1 drop into both eyes 2 (two) times daily., Disp: , Rfl:    diclofenac Sodium (VOLTAREN) 1 % GEL, Apply topically., Disp: , Rfl:    fexofenadine (ALLEGRA) 180 MG tablet, TAKE 1 TABLET BY MOUTH EVERY DAY (Patient taking differently: Take 180  mg by mouth every other day.), Disp: 90 tablet, Rfl: 4   fluticasone (FLONASE) 50 MCG/ACT nasal spray, USE 2 SPRAYS IN BOTH NOSTRILS  DAILY AS NEEDED FOR ALLERGIES, Disp: 48 g, Rfl: 3   halobetasol (ULTRAVATE) 0.05 % ointment, Apply 1 application  topically daily as needed (psoriasis). Apply to aa's psoriasis BID PRN. Avoid applying to face, groin, and axilla. Use as directed. Long-term use can cause thinning of the skin., Disp: 50 g, Rfl: 5   [START ON 06/26/2023] inFLIXimab-abda (RENFLEXIS) 100 MG SOLR, Inject 100 mg into the vein every 2 (two) months., Disp: , Rfl:    LORazepam (ATIVAN) 1 MG tablet, TAKE ONE-HALF TO ONE TABLET BY MOUTH TWICE DAILY AS NEEDED FOR ANXIETY, Disp: 20 tablet, Rfl: 1   mupirocin ointment (BACTROBAN) 2 %, Apply 1 Application topically daily., Disp: 22 g, Rfl: 0   nitroGLYCERIN (NITROSTAT) 0.4 MG SL tablet, Place 1 tablet (0.4 mg total) under the tongue every 5 (five) minutes as needed for chest pain. 1 tablet may be used every 5 minutes as needed, for up to 15 minutes. Do not take more than 3 tablets in 15 minutes. If you do not feel better or feel worse after 1 dose, call 9-1-1 at once., Disp: 90 tablet, Rfl: 3   oxyCODONE-acetaminophen (PERCOCET) 10-325 MG tablet, Take 1 tablet by mouth 3 (three) times daily., Disp: , Rfl:    Povidone (IVIZIA DRY EYES OP), Apply 2 drops to eye 2 (two) times daily.,  Disp: , Rfl:    pramoxine-hydrocortisone (ANALPRAM-HC) 1-1 % rectal cream, Place 1 application rectally 2 (two) times daily. (Patient taking differently: Place 1 application  rectally 2 (two) times daily as needed for hemorrhoids.), Disp: 30 g, Rfl: 2   promethazine (PHENERGAN) 25 MG tablet, Take 1 tablet (25 mg total) by mouth every 8 (eight) hours as needed., Disp: 30 tablet, Rfl: 3   rosuvastatin (CRESTOR) 10 MG tablet, TAKE 1 TABLET BY MOUTH DAILY, Disp: 90 tablet, Rfl: 2   tiZANidine (ZANAFLEX) 2 MG tablet, Take 2 mg by mouth 3 (three) times daily., Disp: , Rfl:     triamcinolone cream (KENALOG) 0.5 %, Apply 1 application topically 2 (two) times daily. As needed, Disp: , Rfl: 0   vitamin B-12 (CYANOCOBALAMIN) 1000 MCG tablet, Take 1,000 mcg by mouth daily., Disp: , Rfl:    vitamin C (ASCORBIC ACID) 500 MG tablet, Take 500 mg by mouth every other day., Disp: , Rfl:    zolpidem (AMBIEN) 10 MG tablet, Take 1 tablet (10 mg total) by mouth at bedtime as needed for sleep. TAKE ONE TABLET BY MOUTH ONE TIME DAILY AT BEDTIME, Disp: 90 tablet, Rfl: 1  Past Medical History: Past Medical History:  Diagnosis Date   Dysplastic nevus 06/11/2022   left posterior thigh, shave removal 08/06/2022 margins clear   Hemorrhoids    Pneumonia due to COVID-19 virus    Psoriasis    Rheumatoid arthritis (HCC)    SCC (squamous cell carcinoma) 10/08/2022   mid chest right of mid line, EDC    Tobacco Use: Social History   Tobacco Use  Smoking Status Former   Current packs/day: 0.00   Types: Cigarettes   Quit date: 11/22/1987   Years since quitting: 35.6  Smokeless Tobacco Never    Labs: Review Flowsheet  More data exists      Latest Ref Rng & Units 02/22/2016 09/11/2017 03/09/2019 07/22/2022 03/08/2023  Labs for ITP Cardiac and Pulmonary Rehab  Cholestrol 0 - 200 mg/dL 409  811  914  782  956   LDL (calc) 0 - 99 mg/dL 96  213  086  96  89   HDL-C >40 mg/dL 80  578  80  469  89   Trlycerides <150 mg/dL 91  74  629  67  62   Hemoglobin A1c 4.8 - 5.6 % - - - - 6.0      Exercise Target Goals: Exercise Program Goal: Individual exercise prescription set using results from initial 6 min walk test and THRR while considering  patient's activity barriers and safety.   Exercise Prescription Goal: Initial exercise prescription builds to 30-45 minutes a day of aerobic activity, 2-3 days per week.  Home exercise guidelines will be given to patient during program as part of exercise prescription that the participant will acknowledge.   Education: Aerobic Exercise: - Group verbal  and visual presentation on the components of exercise prescription. Introduces F.I.T.T principle from ACSM for exercise prescriptions.  Reviews F.I.T.T. principles of aerobic exercise including progression. Written material given at graduation. Flowsheet Row Cardiac Rehab from 04/10/2023 in [facility] Cardiac and Pulmonary Rehab  Education need identified 04/10/23       Education: Resistance Exercise: - Group verbal and visual presentation on the components of exercise prescription. Introduces F.I.T.T principle from ACSM for exercise prescriptions  Reviews F.I.T.T. principles of resistance exercise including progression. Written material given at graduation.    Education: Exercise & Equipment Safety: - Individual verbal instruction and demonstration of  equipment use and safety with use of the equipment. Flowsheet Row Cardiac Rehab from 04/10/2023 in [facility] Cardiac and Pulmonary Rehab  Date 04/10/23  Educator NT  Instruction Review Code 1- Verbalizes Understanding       Education: Exercise Physiology & General Exercise Guidelines: - Group verbal and written instruction with models to review the exercise physiology of the cardiovascular system and associated critical values. Provides general exercise guidelines with specific guidelines to those with heart or lung disease.    Education: Flexibility, Balance, Mind/Body Relaxation: - Group verbal and visual presentation with interactive activity on the components of exercise prescription. Introduces F.I.T.T principle from ACSM for exercise prescriptions. Reviews F.I.T.T. principles of flexibility and balance exercise training including progression. Also discusses the mind body connection.  Reviews various relaxation techniques to help reduce and manage stress (i.e. Deep breathing, progressive muscle relaxation, and visualization). Balance handout provided to take home. Written material given at graduation.   Activity Barriers & Risk Stratification:   Activity Barriers & Cardiac Risk Stratification - 03/26/23 1008       Activity Barriers & Cardiac Risk Stratification   Activity Barriers Back Problems;Left Knee Replacement;Right Knee Replacement;Neck/Spine Problems;Arthritis   hx of back surgery; right shoulder replacement   Cardiac Risk Stratification Moderate             6 Minute Walk:  6 Minute Walk     Row Name 04/10/23 1604         6 Minute Walk   Phase Initial     Distance 1610 feet     Walk Time 6 minutes     # of Rest Breaks 0     MPH 3.05     METS 3.9     RPE 9     Perceived Dyspnea  0     VO2 Peak 13.66     Symptoms Yes (comment)     Comments trouble taking a deep breath     Resting HR 77 bpm     Resting BP 128/68     Resting Oxygen Saturation  98 %     Exercise Oxygen Saturation  during 6 min walk 97 %     Max Ex. HR 132 bpm     Max Ex. BP 152/64     2 Minute Post BP 136/60              Oxygen Initial Assessment:   Oxygen Re-Evaluation:   Oxygen Discharge (Final Oxygen Re-Evaluation):   Initial Exercise Prescription:  Initial Exercise Prescription - 04/10/23 1600       Date of Initial Exercise RX and Referring Provider   Date 04/10/23    Referring Provider Dr. Yvonne Kendall, MD      Oxygen   Maintain Oxygen Saturation 88% or higher      Treadmill   MPH 2.8    Grade 1    Minutes 15    METs 3.53      NuStep   Level 3    SPM 80    Minutes 15    METs 3.9      T5 Nustep   Level 3    SPM 80    Minutes 15    METs 3.9      Prescription Details   Frequency (times per week) 3    Duration Progress to 30 minutes of continuous aerobic without signs/symptoms of physical distress      Intensity   THRR 40-80% of Max Heartrate 105-133  Ratings of Perceived Exertion 11-13    Perceived Dyspnea 0-4      Progression   Progression Continue to progress workloads to maintain intensity without signs/symptoms of physical distress.      Resistance Training   Training Prescription  Yes    Weight 5 lb    Reps 10-15             Perform Capillary Blood Glucose checks as needed.  Exercise Prescription Changes:   Exercise Prescription Changes     Row Name 04/10/23 1600 06/04/23 1000 06/19/23 1600 06/23/23 0900       Response to Exercise   Blood Pressure (Admit) 128/68 122/60 122/60 --    Blood Pressure (Exercise) 152/64 148/72 164/70 --    Blood Pressure (Exit) 136/60 112/58 106/54 --    Heart Rate (Admit) 77 bpm 102 bpm 104 bpm --    Heart Rate (Exercise) 132 bpm 143 bpm 141 bpm --    Heart Rate (Exit) 86 bpm 103 bpm 97 bpm --    Oxygen Saturation (Admit) 98 % -- -- --    Oxygen Saturation (Exercise) 97 % -- -- --    Rating of Perceived Exertion (Exercise) 9 13 14  --    Perceived Dyspnea (Exercise) 0 0 -- --    Symptoms trouble taking deep breath -- -- --    Comments Results First two weeks of exercise -- --    Duration -- Progress to 30 minutes of  aerobic without signs/symptoms of physical distress Continue with 30 min of aerobic exercise without signs/symptoms of physical distress. Continue with 30 min of aerobic exercise without signs/symptoms of physical distress.    Intensity -- THRR unchanged THRR unchanged THRR unchanged      Progression   Progression -- Continue to progress workloads to maintain intensity without signs/symptoms of physical distress. Continue to progress workloads to maintain intensity without signs/symptoms of physical distress. Continue to progress workloads to maintain intensity without signs/symptoms of physical distress.    Average METs -- 3.8 3.79 3.79      Resistance Training   Training Prescription -- Yes Yes Yes    Weight -- 5 lb 5 lb 5 lb    Reps -- 10-15 10-15 10-15      Interval Training   Interval Training -- No No No      Treadmill   MPH -- 3.2 3.2 3.2    Grade -- 2.5 2.5 2.5    Minutes -- 15 15 15     METs -- 4.55 4.55 4.55      NuStep   Level -- -- 5 5    Minutes -- -- 15 15    METs -- -- 3.9 3.9       REL-XR   Level -- 4 4 4     Minutes -- 15 15 15     METs -- -- 3.2 3.2      T5 Nustep   Level -- 4  T6 5  T6 nustep 5  T6 nustep    Minutes -- 15 15 15     METs -- 2.8 3 3       Home Exercise Plan   Plans to continue exercise at -- -- -- Home (comment)  walk, stationary bike, TM at home    Frequency -- -- -- Add 2 additional days to program exercise sessions.    Initial Home Exercises Provided -- -- -- 06/23/23      Oxygen   Maintain Oxygen Saturation -- 88% or  higher 88% or higher 88% or higher             Exercise Comments:   Exercise Comments     Row Name 05/12/23 0806           Exercise Comments First full day of exercise!  Patient was oriented to gym and equipment including functions, settings, policies, and procedures.  Patient's individual exercise prescription and treatment plan were reviewed.  All starting workloads were established based on the results of the 6 minute walk test done at initial orientation visit.  The plan for exercise progression was also introduced and progression will be customized based on patient's performance and goals.                Exercise Goals and Review:   Exercise Goals     Row Name 04/10/23 1605             Exercise Goals   Increase Physical Activity Yes       Intervention Provide advice, education, support and counseling about physical activity/exercise needs.;Develop an individualized exercise prescription for aerobic and resistive training based on initial evaluation findings, risk stratification, comorbidities and participant's personal goals.       Expected Outcomes Short Term: Attend rehab on a regular basis to increase amount of physical activity.;Long Term: Add in home exercise to make exercise part of routine and to increase amount of physical activity.;Long Term: Exercising regularly at least 3-5 days a week.       Increase Strength and Stamina Yes       Intervention Provide advice, education, support and  counseling about physical activity/exercise needs.;Develop an individualized exercise prescription for aerobic and resistive training based on initial evaluation findings, risk stratification, comorbidities and participant's personal goals.       Expected Outcomes Short Term: Increase workloads from initial exercise prescription for resistance, speed, and METs.;Short Term: Perform resistance training exercises routinely during rehab and add in resistance training at home;Long Term: Improve cardiorespiratory fitness, muscular endurance and strength as measured by increased METs and functional capacity ( )       Able to understand and use rate of perceived exertion (RPE) scale Yes       Intervention Provide education and explanation on how to use RPE scale       Expected Outcomes Short Term: Able to use RPE daily in rehab to express subjective intensity level;Long Term:  Able to use RPE to guide intensity level when exercising independently       Able to understand and use Dyspnea scale Yes       Intervention Provide education and explanation on how to use Dyspnea scale       Expected Outcomes Short Term: Able to use Dyspnea scale daily in rehab to express subjective sense of shortness of breath during exertion;Long Term: Able to use Dyspnea scale to guide intensity level when exercising independently       Knowledge and understanding of Target Heart Rate Range (THRR) Yes       Intervention Provide education and explanation of THRR including how the numbers were predicted and where they are located for reference       Expected Outcomes Short Term: Able to state/look up THRR;Long Term: Able to use THRR to govern intensity when exercising independently;Short Term: Able to use daily as guideline for intensity in rehab       Able to check pulse independently Yes       Intervention Provide education and demonstration  on how to check pulse in carotid and radial arteries.;Review the importance of being able to  check your own pulse for safety during independent exercise       Expected Outcomes Short Term: Able to explain why pulse checking is important during independent exercise;Long Term: Able to check pulse independently and accurately       Understanding of Exercise Prescription Yes       Intervention Provide education, explanation, and written materials on patient's individual exercise prescription       Expected Outcomes Short Term: Able to explain program exercise prescription;Long Term: Able to explain home exercise prescription to exercise independently                Exercise Goals Re-Evaluation :  Exercise Goals Re-Evaluation     Row Name 05/12/23 0807 06/04/23 1057 06/19/23 1612 06/23/23 0940       Exercise Goal Re-Evaluation   Exercise Goals Review Able to understand and use rate of perceived exertion (RPE) scale;Knowledge and understanding of Target Heart Rate Range (THRR);Able to understand and use Dyspnea scale;Understanding of Exercise Prescription Increase Physical Activity;Increase Strength and Stamina;Understanding of Exercise Prescription Increase Physical Activity;Increase Strength and Stamina;Understanding of Exercise Prescription Increase Physical Activity;Able to understand and use rate of perceived exertion (RPE) scale;Knowledge and understanding of Target Heart Rate Range (THRR);Understanding of Exercise Prescription;Increase Strength and Stamina;Able to understand and use Dyspnea scale;Able to check pulse independently    Comments Reviewed RPE and dyspnea scale, THR and program prescription with pt today.  Pt voiced understanding and was given a copy of goals to take home. Fye is off to a good start in the program. She has already been able to increase her workload on the treadmill from 1% incline to 2.5% and her speed from 2.8 mph to 3.2 mph. She has also increased her level on the T6 nustep from level 3 to level 4. We will continue to monitor her progress in the program.  Chandal continues to do well in the program. She continues to walk the treadmill at a speed of 3.2 mph with an incline of 2.5%. She also improved to level 5 on the T4 nustep and T6 nustep. She has done well with 5 lb hand weights for resistance training as well. We will continue to monitor her progress in the program. Reviewed home exercise with pt today.  Pt plans to walk, use her TM, and stationary bike  for exercise.  Reviewed THR, pulse, RPE, sign and symptoms, pulse oximetery and when to call 911 or MD.  Also discussed weather considerations and indoor options.  Pt voiced understanding.    Expected Outcomes Short: Use RPE daily to regulate intensity. Long: Follow program prescription in THR. Short: Continue to follow current exercise prescription. Long: Continue exercise to improve strength and stamina. Short: Begin to progressively increase treadmill workload. Long: Continue exercise to improve strength and stamina. Short: add 1-2 days a week of exercise at home on off days of rehab. Long: become independent with exercise routine.             Discharge Exercise Prescription (Final Exercise Prescription Changes):  Exercise Prescription Changes - 06/23/23 0900       Response to Exercise   Duration Continue with 30 min of aerobic exercise without signs/symptoms of physical distress.    Intensity THRR unchanged      Progression   Progression Continue to progress workloads to maintain intensity without signs/symptoms of physical distress.    Average METs 3.79  Resistance Training   Training Prescription Yes    Weight 5 lb    Reps 10-15      Interval Training   Interval Training No      Treadmill   MPH 3.2    Grade 2.5    Minutes 15    METs 4.55      NuStep   Level 5    Minutes 15    METs 3.9      REL-XR   Level 4    Minutes 15    METs 3.2      T5 Nustep   Level 5   T6 nustep   Minutes 15    METs 3      Home Exercise Plan   Plans to continue exercise at Home  (comment)   walk, stationary bike, TM at home   Frequency Add 2 additional days to program exercise sessions.    Initial Home Exercises Provided 06/23/23      Oxygen   Maintain Oxygen Saturation 88% or higher             Nutrition:  Target Goals: Understanding of nutrition guidelines, daily intake of sodium 1500mg , cholesterol 200mg , calories 30% from fat and 7% or less from saturated fats, daily to have 5 or more servings of fruits and vegetables.  Education: All About Nutrition: -Group instruction provided by verbal, written material, interactive activities, discussions, models, and posters to present general guidelines for heart healthy nutrition including fat, fiber, MyPlate, the role of sodium in heart healthy nutrition, utilization of the nutrition label, and utilization of this knowledge for meal planning. Follow up email sent as well. Written material given at graduation. Flowsheet Row Cardiac Rehab from 04/10/2023 in [facility] Cardiac and Pulmonary Rehab  Education need identified 04/10/23       Biometrics:  Pre Biometrics - 04/10/23 1606       Pre Biometrics   Height 5\' 3"  (1.6 m)    Weight 134 lb 9.6 oz (61.1 kg)    Waist Circumference 32.5 inches    Hip Circumference 37 inches    Waist to Hip Ratio 0.88 %    BMI (Calculated) 23.85    Single Leg Stand 23.4 seconds              Nutrition Therapy Plan and Nutrition Goals:  Nutrition Therapy & Goals - 04/10/23 1611       Intervention Plan   Intervention Prescribe, educate and counsel regarding individualized specific dietary modifications aiming towards targeted core components such as weight, hypertension, lipid management, diabetes, heart failure and other comorbidities.    Expected Outcomes Short Term Goal: Understand basic principles of dietary content, such as calories, fat, sodium, cholesterol and nutrients.;Short Term Goal: A plan has been developed with personal nutrition goals set during dietitian  appointment.;Long Term Goal: Adherence to prescribed nutrition plan.             Nutrition Assessments:  MEDIFICTS Score Key: >=70 Need to make dietary changes  40-70 Heart Healthy Diet <= 40 Therapeutic Level Cholesterol Diet  Flowsheet Row Cardiac Rehab from 04/10/2023 in [facility] Cardiac and Pulmonary Rehab  Picture Your Plate Total Score on Admission 68      Picture Your Plate Scores: <16 Unhealthy dietary pattern with much room for improvement. 41-50 Dietary pattern unlikely to meet recommendations for good health and room for improvement. 51-60 More healthful dietary pattern, with some room for improvement.  >60 Healthy dietary pattern, although there may be some specific  behaviors that could be improved.    Nutrition Goals Re-Evaluation:  Nutrition Goals Re-Evaluation     Row Name 05/21/23 0945 06/18/23 0928           Goals   Current Weight 135 lb (61.2 kg) --      Comment Patient deferred dietitian appointment Patient deferred dietitian appointment               Nutrition Goals Discharge (Final Nutrition Goals Re-Evaluation):  Nutrition Goals Re-Evaluation - 06/18/23 0928       Goals   Comment Patient deferred dietitian appointment             Psychosocial: Target Goals: Acknowledge presence or absence of significant depression and/or stress, maximize coping skills, provide positive support system. Participant is able to verbalize types and ability to use techniques and skills needed for reducing stress and depression.   Education: Stress, Anxiety, and Depression - Group verbal and visual presentation to define topics covered.  Reviews how body is impacted by stress, anxiety, and depression.  Also discusses healthy ways to reduce stress and to treat/manage anxiety and depression.  Written material given at graduation.   Education: Sleep Hygiene -Provides group verbal and written instruction about how sleep can affect your health.  Define sleep  hygiene, discuss sleep cycles and impact of sleep habits. Review good sleep hygiene tips.    Initial Review & Psychosocial Screening:  Initial Psych Review & Screening - 03/26/23 1013       Initial Review   Current issues with Current Sleep Concerns;Current Stress Concerns;Current Depression      Family Dynamics   Good Support System? Yes   husband, siblings, family     Barriers   Psychosocial barriers to participate in program There are no identifiable barriers or psychosocial needs.;The patient should benefit from training in stress management and relaxation.      Screening Interventions   Interventions Encouraged to exercise;Provide feedback about the scores to participant;To provide support and resources with identified psychosocial needs    Expected Outcomes Short Term goal: Utilizing psychosocial counselor, staff and physician to assist with identification of specific Stressors or current issues interfering with healing process. Setting desired goal for each stressor or current issue identified.;Long Term Goal: Stressors or current issues are controlled or eliminated.;Short Term goal: Identification and review with participant of any Quality of Life or Depression concerns found by scoring the questionnaire.;Long Term goal: The participant improves quality of Life and PHQ9 Scores as seen by post scores and/or verbalization of changes             Quality of Life Scores:   Quality of Life - 04/10/23 1612       Quality of Life   Select Quality of Life      Quality of Life Scores   Health/Function Pre 19.6 %    Socioeconomic Pre 28.29 %    Psych/Spiritual Pre 24.86 %    Family Pre 20.4 %    GLOBAL Pre 22.59 %            Scores of 19 and below usually indicate a poorer quality of life in these areas.  A difference of  2-3 points is a clinically meaningful difference.  A difference of 2-3 points in the total score of the Quality of Life Index has been associated with  significant improvement in overall quality of life, self-image, physical symptoms, and general health in studies assessing change in quality of life.  PHQ-9: Review  Flowsheet  More data exists      06/18/2023 05/21/2023 04/10/2023 03/25/2023 11/04/2022  Depression screen PHQ 2/9  Decreased Interest 2 2 1  0 0  Down, Depressed, Hopeless 1 1 1  0 0  PHQ - 2 Score 3 3 2  0 0  Altered sleeping 3 3 3  - 0  Tired, decreased energy 3 1 1  - 3  Change in appetite 3 2 1  - 2  Feeling bad or failure about yourself  0 0 0 - 0  Trouble concentrating 0 0 0 - 0  Moving slowly or fidgety/restless 0 0 1 - 0  Suicidal thoughts 0 0 0 - 0  PHQ-9 Score 12 9 8  - 5  Difficult doing work/chores Not difficult at all Very difficult Very difficult - Not difficult at all   Interpretation of Total Score  Total Score Depression Severity:  1-4 = Minimal depression, 5-9 = Mild depression, 10-14 = Moderate depression, 15-19 = Moderately severe depression, 20-27 = Severe depression   Psychosocial Evaluation and Intervention:  Psychosocial Evaluation - 03/26/23 1030       Psychosocial Evaluation & Interventions   Comments Mikeila is coming to cardiac rehab after stent placement. She lives a very active lifestyle and is interested in coming to the program to learn more about the best way to exercise given her recent heart event. She used to play a lot of sports and she is unable to do that with her arthritis and other joint issues, so she tries to stay busy with other outdoor activities. She recently went off her depression medication because she is tired of taking medications. She was on it after one of her daughters passed away last year. She knows the grief will always be present and she is trying to spend extra time with her other daughter who doesn't live in [place]. When she is feeling down, she states she goes outside. She doesn't like to be inside, so she will find things to occupy her time outside. Her symptoms of dyspnea and  chest discomfort made her originally think it was her lungs given her history of pneumonia,so she was surprised to find out it was her heart. 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 After her trip in October to visit her daughter, she is ready to get started in the program to learn more about heart healthy living.    Expected Outcomes Short: attend cardiac rehab for education and exercise. Long: develop and maintain positive self care habits    Continue Psychosocial Services  Follow up required by staff             Psychosocial Re-Evaluation:  Psychosocial Re-Evaluation     Row Name 05/21/23 [id number] 06/18/23 0926           Psychosocial Re-Evaluation   Current issues with Current Stress Concerns Current Stress Concerns      Comments Lauralea has lost her daughter over a year ago and still has some stress and sadness that comes on at times.Reviewed patient health questionnaire (PHQ-9) with patient for follow up. Previously, patients score indicated signs/symptoms of depression.  Reviewed to see if patient is improving symptom wise while in program.  Score declined and patient states that it is because of her health and her daughter being gone has made it more stressful. Reviewed patient health questionnaire (PHQ-9) with patient for follow up. Previously, patients score indicated signs/symptoms of depression.  Reviewed to see if patient is improving  symptom wise while in program.  Score declined and patient states that it is because she does not have much energy and is still dealing with the loss of her daughter.      Expected Outcomes Short: Continue to work toward an improvement in PHQ9 scores by attending [facility] regularly. Long: Continue to improve stress and depression coping skills by talking with staff and attending [facility] regularly and work toward a positive mental state. Short: Continue to work toward an improvement in PHQ9 scores by attending  [facility] regularly. Long: Continue to improve stress and depression coping skills by talking with staff and attending [facility] regularly and work toward a positive mental state.      Interventions Encouraged to attend Cardiac Rehabilitation for the exercise Encouraged to attend Cardiac Rehabilitation for the exercise      Continue Psychosocial Services  Follow up required by staff Follow up required by staff               Psychosocial Discharge (Final Psychosocial Re-Evaluation):  Psychosocial Re-Evaluation - 06/18/23 0926       Psychosocial Re-Evaluation   Current issues with Current Stress Concerns    Comments Reviewed patient health questionnaire (PHQ-9) with patient for follow up. Previously, patients score indicated signs/symptoms of depression.  Reviewed to see if patient is improving symptom wise while in program.  Score declined and patient states that it is because she does not have much energy and is still dealing with the loss of her daughter.    Expected Outcomes Short: Continue to work toward an improvement in PHQ9 scores by attending [facility] regularly. Long: Continue to improve stress and depression coping skills by talking with staff and attending [facility] regularly and work toward a positive mental state.    Interventions Encouraged to attend Cardiac Rehabilitation for the exercise    Continue Psychosocial Services  Follow up required by staff             Vocational Rehabilitation: Provide vocational rehab assistance to qualifying candidates.   Vocational Rehab Evaluation & Intervention:  Vocational Rehab - 03/26/23 1010       Initial Vocational Rehab Evaluation & Intervention   Assessment shows need for Vocational Rehabilitation No             Education: Education Goals: Education classes will be provided on a variety of topics geared toward better understanding of heart health and risk factor modification. Participant will  state understanding/return demonstration of topics presented as noted by education test scores.  Learning Barriers/Preferences:  Learning Barriers/Preferences - 03/26/23 1010       Learning Barriers/Preferences   Learning Barriers None    Learning Preferences None             General Cardiac Education Topics:  AED/CPR: - Group verbal and written instruction with the use of models to demonstrate the basic use of the AED with the basic ABC's of resuscitation.   Anatomy and Cardiac Procedures: - Group verbal and visual presentation and models provide information about basic cardiac anatomy and function. Reviews the testing methods done to diagnose heart disease and the outcomes of the test results. Describes the treatment choices: Medical Management, Angioplasty, or Coronary Bypass Surgery for treating various heart conditions including Myocardial Infarction, Angina, Valve Disease, and Cardiac Arrhythmias.  Written material given at graduation. Flowsheet Row Cardiac Rehab from 04/10/2023 in [facility] Cardiac and Pulmonary Rehab  Education need identified 04/10/23       Medication Safety: - Group verbal and visual  instruction to review commonly prescribed medications for heart and lung disease. Reviews the medication, class of the drug, and side effects. Includes the steps to properly store meds and maintain the prescription regimen.  Written material given at graduation.   Intimacy: - Group verbal instruction through game format to discuss how heart and lung disease can affect sexual intimacy. Written material given at graduation..   Know Your Numbers and Heart Failure: - Group verbal and visual instruction to discuss disease risk factors for cardiac and pulmonary disease and treatment options.  Reviews associated critical values for Overweight/Obesity, Hypertension, Cholesterol, and Diabetes.  Discusses basics of heart failure: signs/symptoms and treatments.  Introduces Heart Failure  Zone chart for action plan for heart failure.  Written material given at graduation.   Infection Prevention: - Provides verbal and written material to individual with discussion of infection control including proper hand washing and proper equipment cleaning during exercise session. Flowsheet Row Cardiac Rehab from 04/10/2023 in [facility] Cardiac and Pulmonary Rehab  Date 04/10/23  Educator NT  Instruction Review Code 1- Verbalizes Understanding       Falls Prevention: - Provides verbal and written material to individual with discussion of falls prevention and safety. Flowsheet Row Cardiac Rehab from 04/10/2023 in [facility] Cardiac and Pulmonary Rehab  Date 04/10/23  Educator NT  Instruction Review Code 1- Verbalizes Understanding       Other: -Provides group and verbal instruction on various topics (see comments)   Knowledge Questionnaire Score:  Knowledge Questionnaire Score - 04/10/23 1610       Knowledge Questionnaire Score   Pre Score 20/26             Core Components/Risk Factors/Patient Goals at Admission:  Personal Goals and Risk Factors at Admission - 03/26/23 1009       Core Components/Risk Factors/Patient Goals on Admission   Hypertension Yes    Intervention Provide education on lifestyle modifcations including regular physical activity/exercise, weight management, moderate sodium restriction and increased consumption of fresh fruit, vegetables, and low fat dairy, alcohol moderation, and smoking cessation.;Monitor prescription use compliance.    Expected Outcomes Short Term: Continued assessment and intervention until BP is < 140/82mm HG in hypertensive participants. < 130/88mm HG in hypertensive participants with diabetes, heart failure or chronic kidney disease.;Long Term: Maintenance of blood pressure at goal levels.    Lipids Yes    Intervention Provide education and support for participant on nutrition & aerobic/resistive exercise along with prescribed medications  to achieve LDL 70mg , HDL >40mg .    Expected Outcomes Short Term: Participant states understanding of desired cholesterol values and is compliant with medications prescribed. Participant is following exercise prescription and nutrition guidelines.;Long Term: Cholesterol controlled with medications as prescribed, with individualized exercise RX and with personalized nutrition plan. Value goals: LDL < 70mg , HDL > 40 mg.             Education:Diabetes - Individual verbal and written instruction to review signs/symptoms of diabetes, desired ranges of glucose level fasting, after meals and with exercise. Acknowledge that pre and post exercise glucose checks will be done for 3 sessions at entry of program.   Core Components/Risk Factors/Patient Goals Review:   Goals and Risk Factor Review     Row Name 05/21/23 0948 06/18/23 0929           Core Components/Risk Factors/Patient Goals Review   Personal Goals Review Other --      Review Jaidan states that she is going to continue rehab but is not  sure if she is going to continue the whole program. She feels like she is doing well and would do exercise at home. Informed her to tell staff when she feels like she wants to be done. She states she would do the program at least until the end of November. Santiaga takes her blood pressure at home and it has been running low. She was 110/60 today and has not had any issues. She feels tired alot at home and states maybe it is her blood pressure. Infomed her to talk to her doctor to talk about possible blood pressure medication changes.      Expected Outcomes Short: continue exercise through November Long: exercise at home independently. short: consult doctor about blood pressure medications. Long: take medications as prescribed.               Core Components/Risk Factors/Patient Goals at Discharge (Final Review):   Goals and Risk Factor Review - 06/18/23 0929       Core Components/Risk Factors/Patient  Goals Review   Review Cottie takes her blood pressure at home and it has been running low. She was 110/60 today and has not had any issues. She feels tired alot at home and states maybe it is her blood pressure. Infomed her to talk to her doctor to talk about possible blood pressure medication changes.    Expected Outcomes short: consult doctor about blood pressure medications. Long: take medications as prescribed.             ITP Comments:  ITP Comments     Row Name 03/26/23 1010 04/10/23 1557 05/07/23 0854 05/12/23 0806 05/28/23 0933   ITP Comments Initial phone call completed. Diagnosis can be found in [facility] 8/30. EP Orientation scheduled for 10/3 at 2:30. Completed and gym orientation. Initial ITP created and sent for review to Dr. Bethann Punches, Medical Director. 30 Day review completed. Medical Director ITP review done, changes made as directed, and signed approval by Medical Director.   new to program First full day of exercise!  Patient was oriented to gym and equipment including functions, settings, policies, and procedures.  Patient's individual exercise prescription and treatment plan were reviewed.  All starting workloads were established based on the results of the 6 minute walk test done at initial orientation visit.  The plan for exercise progression was also introduced and progression will be customized based on patient's performance and goals. 30 Day review completed. Medical Director ITP review done, changes made as directed, and signed approval by Medical Director. new to program    Row Name 06/25/23 1147           ITP Comments 30 Day review completed. Medical Director ITP review done, changes made as directed, and signed approval by Medical Director.                Comments:

## 2023-06-25 NOTE — Progress Notes (Signed)
Daily Session Note  Patient Details  Name: Meredith Russell MRN: [id number] Date of Birth: 1950-10-02 Referring Provider:   Flowsheet Row Cardiac Rehab from 04/10/2023 in [facility] Cardiac and Pulmonary Rehab  Referring Provider Dr. Yvonne Kendall, MD       Encounter Date: 06/25/2023  Check In:  Session Check In - 06/25/23 0935       Check-In   Supervising physician immediately available to respond to emergencies See telemetry face sheet for immediately available ER MD    Staff Present Elige Ko, RCP,RRT,BSRT;Krista Karleen Hampshire RN, BSN;Maxon Conetta BS, , Exercise Physiologist;Lynetta Tomczak Katrinka Blazing, RN, ADN    Virtual Visit No    Medication changes reported     No    Fall or balance concerns reported    No    Warm-up and Cool-down Performed on first and last piece of equipment    Resistance Training Performed Yes    VAD Patient? No    PAD/SET Patient? No      Pain Assessment   Currently in Pain? No/denies                Social History   Tobacco Use  Smoking Status Former   Current packs/day: 0.00   Types: Cigarettes   Quit date: 11/22/1987   Years since quitting: 35.6  Smokeless Tobacco Never    Goals Met:  Independence with exercise equipment Exercise tolerated well No report of concerns or symptoms today Strength training completed today  Goals Unmet:  Not Applicable  Comments: Pt able to follow exercise prescription today without complaint.  Will continue to monitor for progression.    Dr. Bethann Punches is Medical Director for [facility] Cardiac Rehabilitation.  Dr. Vida Rigger is Medical Director for [facility] Pulmonary Rehabilitation.

## 2023-06-27 ENCOUNTER — Encounter: Payer: Medicare Other | Admitting: *Deleted

## 2023-06-27 DIAGNOSIS — Z955 Presence of coronary angioplasty implant and graft: Secondary | ICD-10-CM

## 2023-06-27 NOTE — Progress Notes (Signed)
Daily Session Note  Patient Details  Name: Meredith Russell MRN: [id number] Date of Birth: Jun 10, 1951 Referring Provider:   Flowsheet Row Cardiac Rehab from 04/10/2023 in [facility] Cardiac and Pulmonary Rehab  Referring Provider Dr. Yvonne Kendall, MD       Encounter Date: 06/27/2023  Check In:  Session Check In - 06/27/23 0931       Check-In   Supervising physician immediately available to respond to emergencies See telemetry face sheet for immediately available ER MD    Location [facility]-Cardiac & Pulmonary Rehab    Staff Present Elige Ko, RCP,RRT,BSRT;Noah Tickle, BS, Exercise Physiologist;Arkel Cartwright, RN, BSN, CCRP    Virtual Visit No    Medication changes reported     No    Fall or balance concerns reported    No    Warm-up and Cool-down Performed on first and last piece of equipment    Resistance Training Performed Yes    VAD Patient? No    PAD/SET Patient? No      Pain Assessment   Currently in Pain? No/denies                Social History   Tobacco Use  Smoking Status Former   Current packs/day: 0.00   Types: Cigarettes   Quit date: 11/22/1987   Years since quitting: 35.6  Smokeless Tobacco Never    Goals Met:  Independence with exercise equipment Exercise tolerated well No report of concerns or symptoms today  Goals Unmet:  Not Applicable  Comments: Pt able to follow exercise prescription today without complaint.  Will continue to monitor for progression.    Dr. Bethann Punches is Medical Director for [facility] Cardiac Rehabilitation.  Dr. Vida Rigger is Medical Director for [facility] Pulmonary Rehabilitation.

## 2023-07-07 ENCOUNTER — Encounter: Payer: Medicare Other | Admitting: *Deleted

## 2023-07-07 DIAGNOSIS — Z955 Presence of coronary angioplasty implant and graft: Secondary | ICD-10-CM

## 2023-07-07 NOTE — Progress Notes (Signed)
Daily Session Note  Patient Details  Name: Meredith Russell MRN: [id number] Date of Birth: 1951-04-18 Referring Provider:   Flowsheet Row Cardiac Rehab from 04/10/2023 in [facility] Cardiac and Pulmonary Rehab  Referring Provider Dr. Yvonne Kendall, MD       Encounter Date: 07/07/2023  Check In:  Session Check In - 07/07/23 0934       Check-In   Supervising physician immediately available to respond to emergencies See telemetry face sheet for immediately available ER MD    Location [facility]-Cardiac & Pulmonary Rehab    Staff Present Ronette Deter, BS, Exercise Physiologist;Joseph Bouse, [place];Cora Collum, RN, BSN, CCRP;Malonie Tatum Katrinka Blazing, RN, ADN    Virtual Visit No    Medication changes reported     No    Fall or balance concerns reported    No    Warm-up and Cool-down Performed on first and last piece of equipment    Resistance Training Performed Yes    VAD Patient? No    PAD/SET Patient? No      Pain Assessment   Currently in Pain? No/denies                Social History   Tobacco Use  Smoking Status Former   Current packs/day: 0.00   Types: Cigarettes   Quit date: 11/22/1987   Years since quitting: 35.6  Smokeless Tobacco Never    Goals Met:  Independence with exercise equipment Exercise tolerated well No report of concerns or symptoms today Strength training completed today  Goals Unmet:  Not Applicable  Comments: Pt able to follow exercise prescription today without complaint.  Will continue to monitor for progression.   Dr. Bethann Punches is Medical Director for [facility] Cardiac Rehabilitation.  Dr. Vida Rigger is Medical Director for [facility] Pulmonary Rehabilitation.

## 2023-07-10 ENCOUNTER — Ambulatory Visit
Admission: RE | Admit: 2023-07-10 | Discharge: 2023-07-10 | Disposition: A | Discharge status: Home / Self Care | Payer: Medicare Other | Source: Ambulatory Visit | Attending: Family Medicine | Admitting: Family Medicine

## 2023-07-10 DIAGNOSIS — Z1231 Encounter for screening mammogram for malignant neoplasm of breast: Secondary | ICD-10-CM | POA: Insufficient documentation

## 2023-07-11 ENCOUNTER — Encounter: Payer: Medicare Other | Attending: Internal Medicine | Admitting: *Deleted

## 2023-07-11 VITALS — Ht 63.0 in | Wt 129.7 lb

## 2023-07-11 DIAGNOSIS — Z955 Presence of coronary angioplasty implant and graft: Secondary | ICD-10-CM | POA: Insufficient documentation

## 2023-07-11 NOTE — Progress Notes (Signed)
 Cardiac Individual Treatment Plan  Patient Details  Name: Meredith Russell MRN: [id number] Date of Birth: Jan 08, 1951 Referring Provider:   Flowsheet Row Cardiac Rehab from 04/10/2023 in [facility] Cardiac and Pulmonary Rehab  Referring Provider Dr. Lonni Hanson, MD       Initial Encounter Date:  Flowsheet Row Cardiac Rehab from 04/10/2023 in [facility] Cardiac and Pulmonary Rehab  Date 04/10/23       Visit Diagnosis: Status post coronary artery stent placement  Patient's Home Medications on Admission:  Current Outpatient Medications:    amLODipine  (NORVASC ) 10 MG tablet, TAKE 1 TABLET BY MOUTH DAILY, Disp: 90 tablet, Rfl: 2   aspirin  EC 81 MG tablet, Take 1 tablet (81 mg total) by mouth daily. Swallow whole., Disp: , Rfl:    azelastine  (ASTELIN ) 0.1 % nasal spray, Place 2 sprays into both nostrils 2 (two) times daily. Use in each nostril as directed, Disp: 30 mL, Rfl: 5   butalbital -acetaminophen -caffeine  (FIORICET ) 50-325-40 MG tablet, TAKE 1-2 TABLET BY MOUTH EVERY 6 (SIX) HOURS AS NEEDED FOR HEADACHE., Disp: 30 tablet, Rfl: 5   calcipotriene  (DOVONOX) 0.005 % cream, Apply 1 application  topically 3 (three) times daily as needed (psoriasis). (Patient not taking: Reported on 06/12/2023), Disp: , Rfl:    Calcipotriene -Betameth Diprop (WYNZORA ) 0.005-0.064 % CREA, Apply to aa's psoriasis BID PRN., Disp: 60 g, Rfl: 5   Cholecalciferol  (VITAMIN D3) 1000 units CAPS, Take 1,000 Units by mouth daily. , Disp: , Rfl:    clopidogrel  (PLAVIX ) 75 MG tablet, Take 1 tablet (75 mg total) by mouth daily. Loading dose of 300 mg (4 tablets) morning of 05/03/23 then 75 mg (1 tablet) thereafter, Disp: 94 tablet, Rfl: 3   cycloSPORINE (RESTASIS) 0.05 % ophthalmic emulsion, Place 1 drop into both eyes 2 (two) times daily., Disp: , Rfl:    diclofenac Sodium (VOLTAREN) 1 % GEL, Apply topically., Disp: , Rfl:    fexofenadine  (ALLEGRA ) 180 MG tablet, TAKE 1 TABLET BY MOUTH EVERY DAY (Patient taking differently: Take 180  mg by mouth every other day.), Disp: 90 tablet, Rfl: 4   fluticasone  (FLONASE ) 50 MCG/ACT nasal spray, USE 2 SPRAYS IN BOTH NOSTRILS  DAILY AS NEEDED FOR ALLERGIES, Disp: 48 g, Rfl: 3   halobetasol  (ULTRAVATE ) 0.05 % ointment, Apply 1 application  topically daily as needed (psoriasis). Apply to aa's psoriasis BID PRN. Avoid applying to face, groin, and axilla. Use as directed. Long-term use can cause thinning of the skin., Disp: 50 g, Rfl: 5   inFLIXimab -abda (RENFLEXIS ) 100 MG SOLR, Inject 100 mg into the vein every 2 (two) months., Disp: , Rfl:    LORazepam  (ATIVAN ) 1 MG tablet, TAKE ONE-HALF TO ONE TABLET BY MOUTH TWICE DAILY AS NEEDED FOR ANXIETY, Disp: 20 tablet, Rfl: 1   mupirocin  ointment (BACTROBAN ) 2 %, Apply 1 Application topically daily., Disp: 22 g, Rfl: 0   nitroGLYCERIN  (NITROSTAT ) 0.4 MG SL tablet, Place 1 tablet (0.4 mg total) under the tongue every 5 (five) minutes as needed for chest pain. 1 tablet may be used every 5 minutes as needed, for up to 15 minutes. Do not take more than 3 tablets in 15 minutes. If you do not feel better or feel worse after 1 dose, call 9-1-1 at once., Disp: 90 tablet, Rfl: 3   oxyCODONE -acetaminophen  (PERCOCET) 10-325 MG tablet, Take 1 tablet by mouth 3 (three) times daily., Disp: , Rfl:    Povidone (IVIZIA DRY EYES OP), Apply 2 drops to eye 2 (two) times daily., Disp: , Rfl:  pramoxine-hydrocortisone  (ANALPRAM -HC) 1-1 % rectal cream, Place 1 application rectally 2 (two) times daily. (Patient taking differently: Place 1 application  rectally 2 (two) times daily as needed for hemorrhoids.), Disp: 30 g, Rfl: 2   promethazine  (PHENERGAN ) 25 MG tablet, Take 1 tablet (25 mg total) by mouth every 8 (eight) hours as needed., Disp: 30 tablet, Rfl: 3   rosuvastatin  (CRESTOR ) 10 MG tablet, TAKE 1 TABLET BY MOUTH DAILY, Disp: 90 tablet, Rfl: 2   tiZANidine  (ZANAFLEX ) 2 MG tablet, Take 2 mg by mouth 3 (three) times daily., Disp: , Rfl:    triamcinolone cream (KENALOG)  0.5 %, Apply 1 application topically 2 (two) times daily. As needed, Disp: , Rfl: 0   vitamin B-12 (CYANOCOBALAMIN ) 1000 MCG tablet, Take 1,000 mcg by mouth daily., Disp: , Rfl:    vitamin C  (ASCORBIC ACID ) 500 MG tablet, Take 500 mg by mouth every other day., Disp: , Rfl:    zolpidem  (AMBIEN ) 10 MG tablet, Take 1 tablet (10 mg total) by mouth at bedtime as needed for sleep. TAKE ONE TABLET BY MOUTH ONE TIME DAILY AT BEDTIME, Disp: 90 tablet, Rfl: 1  Past Medical History: Past Medical History:  Diagnosis Date   Dysplastic nevus 06/11/2022   left posterior thigh, shave removal 08/06/2022 margins clear   Hemorrhoids    Pneumonia due to COVID-19 virus    Psoriasis    Rheumatoid arthritis (HCC)    SCC (squamous cell carcinoma) 10/08/2022   mid chest right of mid line, EDC    Tobacco Use: Social History   Tobacco Use  Smoking Status Former   Current packs/day: 0.00   Types: Cigarettes   Quit date: 11/22/1987   Years since quitting: 35.6  Smokeless Tobacco Never    Labs: Review Flowsheet  More data exists      Latest Ref Rng & Units 02/22/2016 09/11/2017 03/09/2019 07/22/2022 03/08/2023  Labs for ITP Cardiac and Pulmonary Rehab  Cholestrol 0 - 200 mg/dL 805  782  794  776  809   LDL (calc) 0 - 99 mg/dL 96  898  892  96  89   HDL-C >40 mg/dL 80  898  80  884  89   Trlycerides <150 mg/dL 91  74  898  67  62   Hemoglobin A1c 4.8 - 5.6 % - - - - 6.0      Exercise Target Goals: Exercise Program Goal: Individual exercise prescription set using results from initial 6 min walk test and THRR while considering  patient's activity barriers and safety.   Exercise Prescription Goal: Initial exercise prescription builds to 30-45 minutes a day of aerobic activity, 2-3 days per week.  Home exercise guidelines will be given to patient during program as part of exercise prescription that the participant will acknowledge.   Education: Aerobic Exercise: - Group verbal and visual presentation on the  components of exercise prescription. Introduces F.I.T.T principle from ACSM for exercise prescriptions.  Reviews F.I.T.T. principles of aerobic exercise including progression. Written material given at graduation. Flowsheet Row Cardiac Rehab from 04/10/2023 in [facility] Cardiac and Pulmonary Rehab  Education need identified 04/10/23       Education: Resistance Exercise: - Group verbal and visual presentation on the components of exercise prescription. Introduces F.I.T.T principle from ACSM for exercise prescriptions  Reviews F.I.T.T. principles of resistance exercise including progression. Written material given at graduation.    Education: Exercise & Equipment Safety: - Individual verbal instruction and demonstration of equipment use and safety with use  of the equipment. Flowsheet Row Cardiac Rehab from 04/10/2023 in [facility] Cardiac and Pulmonary Rehab  Date 04/10/23  Educator NT  Instruction Review Code 1- Verbalizes Understanding       Education: Exercise Physiology & General Exercise Guidelines: - Group verbal and written instruction with models to review the exercise physiology of the cardiovascular system and associated critical values. Provides general exercise guidelines with specific guidelines to those with heart or lung disease.    Education: Flexibility, Balance, Mind/Body Relaxation: - Group verbal and visual presentation with interactive activity on the components of exercise prescription. Introduces F.I.T.T principle from ACSM for exercise prescriptions. Reviews F.I.T.T. principles of flexibility and balance exercise training including progression. Also discusses the mind body connection.  Reviews various relaxation techniques to help reduce and manage stress (i.e. Deep breathing, progressive muscle relaxation, and visualization). Balance handout provided to take home. Written material given at graduation.   Activity Barriers & Risk Stratification:  Activity Barriers & Cardiac Risk  Stratification - 03/26/23 1008       Activity Barriers & Cardiac Risk Stratification   Activity Barriers Back Problems;Left Knee Replacement;Right Knee Replacement;Neck/Spine Problems;Arthritis   hx of back surgery; right shoulder replacement   Cardiac Risk Stratification Moderate             6 Minute Walk:  6 Minute Walk     Row Name 04/10/23 1604 07/11/23 0939       6 Minute Walk   Phase Initial Discharge    Distance 1610 feet 1875 feet    Distance % Change -- 16.5 %    Distance Feet Change -- 265 ft    Walk Time 6 minutes 6 minutes    # of Rest Breaks 0 0    MPH 3.05 3.55    METS 3.9 4.49    RPE 9 13    Perceived Dyspnea  0 0    VO2 Peak 13.66 15.71    Symptoms Yes (comment) No    Comments trouble taking a deep breath --    Resting HR 77 bpm 74 bpm    Resting BP 128/68 126/64    Resting Oxygen Saturation  98 % 97 %    Exercise Oxygen Saturation  during 6 min walk 97 % 92 %    Max Ex. HR 132 bpm 142 bpm    Max Ex. BP 152/64 154/68    2 Minute Post BP 136/60 --             Oxygen Initial Assessment:   Oxygen Re-Evaluation:   Oxygen Discharge (Final Oxygen Re-Evaluation):   Initial Exercise Prescription:  Initial Exercise Prescription - 04/10/23 1600       Date of Initial Exercise RX and Referring Provider   Date 04/10/23    Referring Provider Dr. Lonni Hanson, MD      Oxygen   Maintain Oxygen Saturation 88% or higher      Treadmill   MPH 2.8    Grade 1    Minutes 15    METs 3.53      NuStep   Level 3    SPM 80    Minutes 15    METs 3.9      T5 Nustep   Level 3    SPM 80    Minutes 15    METs 3.9      Prescription Details   Frequency (times per week) 3    Duration Progress to 30 minutes of continuous aerobic without signs/symptoms of physical  distress      Intensity   THRR 40-80% of Max Heartrate 105-133    Ratings of Perceived Exertion 11-13    Perceived Dyspnea 0-4      Progression   Progression Continue to progress  workloads to maintain intensity without signs/symptoms of physical distress.      Resistance Training   Training Prescription Yes    Weight 5 lb    Reps 10-15             Perform Capillary Blood Glucose checks as needed.  Exercise Prescription Changes:   Exercise Prescription Changes     Row Name 04/10/23 1600 06/04/23 1000 06/19/23 1600 06/23/23 0900 07/03/23 1500     Response to Exercise   Blood Pressure (Admit) 128/68 122/60 122/60 -- 120/62   Blood Pressure (Exercise) 152/64 148/72 164/70 -- --   Blood Pressure (Exit) 136/60 112/58 106/54 -- 100/58   Heart Rate (Admit) 77 bpm 102 bpm 104 bpm -- 114 bpm   Heart Rate (Exercise) 132 bpm 143 bpm 141 bpm -- 140 bpm   Heart Rate (Exit) 86 bpm 103 bpm 97 bpm -- 99 bpm   Oxygen Saturation (Admit) 98 % -- -- -- --   Oxygen Saturation (Exercise) 97 % -- -- -- --   Rating of Perceived Exertion (Exercise) 9 13 14  -- 15   Perceived Dyspnea (Exercise) 0 0 -- -- 0   Symptoms trouble taking deep breath -- -- -- none   Comments Results First two weeks of exercise -- -- --   Duration -- Progress to 30 minutes of  aerobic without signs/symptoms of physical distress Continue with 30 min of aerobic exercise without signs/symptoms of physical distress. Continue with 30 min of aerobic exercise without signs/symptoms of physical distress. Continue with 30 min of aerobic exercise without signs/symptoms of physical distress.   Intensity -- THRR unchanged THRR unchanged THRR unchanged THRR unchanged     Progression   Progression -- Continue to progress workloads to maintain intensity without signs/symptoms of physical distress. Continue to progress workloads to maintain intensity without signs/symptoms of physical distress. Continue to progress workloads to maintain intensity without signs/symptoms of physical distress. Continue to progress workloads to maintain intensity without signs/symptoms of physical distress.   Average METs -- 3.8 3.79  3.79 3.66     Resistance Training   Training Prescription -- Yes Yes Yes Yes   Weight -- 5 lb 5 lb 5 lb 5 lb   Reps -- 10-15 10-15 10-15 10-15     Interval Training   Interval Training -- No No No No     Treadmill   MPH -- 3.2 3.2 3.2 3.7   Grade -- 2.5 2.5 2.5 2   Minutes -- 15 15 15 15    METs -- 4.55 4.55 4.55 4.85     NuStep   Level -- -- 5 5 --   Minutes -- -- 15 15 --   METs -- -- 3.9 3.9 --     REL-XR   Level -- 4 4 4 4    Minutes -- 15 15 15 15    METs -- -- 3.2 3.2 2.7     T5 Nustep   Level -- 4  T6 5  T6 nustep 5  T6 nustep 5  T6   Minutes -- 15 15 15 15    METs -- 2.8 3 3  2.8     Home Exercise Plan   Plans to continue exercise at -- -- -- Home (comment)  walk, stationary bike, TM at home Home (comment)  walk, stationary bike, TM at home   Frequency -- -- -- Add 2 additional days to program exercise sessions. Add 2 additional days to program exercise sessions.   Initial Home Exercises Provided -- -- -- 06/23/23 06/23/23     Oxygen   Maintain Oxygen Saturation -- 88% or higher 88% or higher 88% or higher 88% or higher            Exercise Comments:   Exercise Comments     Row Name 05/12/23 0806           Exercise Comments First full day of exercise!  Patient was oriented to gym and equipment including functions, settings, policies, and procedures.  Patient's individual exercise prescription and treatment plan were reviewed.  All starting workloads were established based on the results of the 6 minute walk test done at initial orientation visit.  The plan for exercise progression was also introduced and progression will be customized based on patient's performance and goals.                Exercise Goals and Review:   Exercise Goals     Row Name 04/10/23 1605             Exercise Goals   Increase Physical Activity Yes       Intervention Provide advice, education, support and counseling about physical activity/exercise needs.;Develop an  individualized exercise prescription for aerobic and resistive training based on initial evaluation findings, risk stratification, comorbidities and participant's personal goals.       Expected Outcomes Short Term: Attend rehab on a regular basis to increase amount of physical activity.;Long Term: Add in home exercise to make exercise part of routine and to increase amount of physical activity.;Long Term: Exercising regularly at least 3-5 days a week.       Increase Strength and Stamina Yes       Intervention Provide advice, education, support and counseling about physical activity/exercise needs.;Develop an individualized exercise prescription for aerobic and resistive training based on initial evaluation findings, risk stratification, comorbidities and participant's personal goals.       Expected Outcomes Short Term: Increase workloads from initial exercise prescription for resistance, speed, and METs.;Short Term: Perform resistance training exercises routinely during rehab and add in resistance training at home;Long Term: Improve cardiorespiratory fitness, muscular endurance and strength as measured by increased METs and functional capacity ( )       Able to understand and use rate of perceived exertion (RPE) scale Yes       Intervention Provide education and explanation on how to use RPE scale       Expected Outcomes Short Term: Able to use RPE daily in rehab to express subjective intensity level;Long Term:  Able to use RPE to guide intensity level when exercising independently       Able to understand and use Dyspnea scale Yes       Intervention Provide education and explanation on how to use Dyspnea scale       Expected Outcomes Short Term: Able to use Dyspnea scale daily in rehab to express subjective sense of shortness of breath during exertion;Long Term: Able to use Dyspnea scale to guide intensity level when exercising independently       Knowledge and understanding of Target Heart Rate Range  (THRR) Yes       Intervention Provide education and explanation of THRR including how the numbers were predicted and where they  are located for reference       Expected Outcomes Short Term: Able to state/look up THRR;Long Term: Able to use THRR to govern intensity when exercising independently;Short Term: Able to use daily as guideline for intensity in rehab       Able to check pulse independently Yes       Intervention Provide education and demonstration on how to check pulse in carotid and radial arteries.;Review the importance of being able to check your own pulse for safety during independent exercise       Expected Outcomes Short Term: Able to explain why pulse checking is important during independent exercise;Long Term: Able to check pulse independently and accurately       Understanding of Exercise Prescription Yes       Intervention Provide education, explanation, and written materials on patient's individual exercise prescription       Expected Outcomes Short Term: Able to explain program exercise prescription;Long Term: Able to explain home exercise prescription to exercise independently                Exercise Goals Re-Evaluation :  Exercise Goals Re-Evaluation     Row Name 05/12/23 0807 06/04/23 1057 06/19/23 1612 06/23/23 0940 07/03/23 1505     Exercise Goal Re-Evaluation   Exercise Goals Review Able to understand and use rate of perceived exertion (RPE) scale;Knowledge and understanding of Target Heart Rate Range (THRR);Able to understand and use Dyspnea scale;Understanding of Exercise Prescription Increase Physical Activity;Increase Strength and Stamina;Understanding of Exercise Prescription Increase Physical Activity;Increase Strength and Stamina;Understanding of Exercise Prescription Increase Physical Activity;Able to understand and use rate of perceived exertion (RPE) scale;Knowledge and understanding of Target Heart Rate Range (THRR);Understanding of Exercise  Prescription;Increase Strength and Stamina;Able to understand and use Dyspnea scale;Able to check pulse independently Increase Physical Activity;Increase Strength and Stamina;Understanding of Exercise Prescription   Comments Reviewed RPE and dyspnea scale, THR and program prescription with pt today.  Pt voiced understanding and was given a copy of goals to take home. Meredith Russell is off to a good start in the program. She has already been able to increase her workload on the treadmill from 1% incline to 2.5% and her speed from 2.8 mph to 3.2 mph. She has also increased her level on the T6 nustep from level 3 to level 4. We will continue to monitor her progress in the program. Meredith Russell to do well in the program. She Russell to walk the treadmill at a speed of 3.2 mph with an incline of 2.5%. She also improved to level 5 on the T4 nustep and T6 nustep. She has done well with 5 lb hand weights for resistance training as well. We will continue to monitor her progress in the program. Reviewed home exercise with pt today.  Pt plans to walk, use her TM, and stationary bike  for exercise.  Reviewed THR, pulse, RPE, sign and symptoms, pulse oximetery and when to call 911 or MD.  Also discussed weather considerations and indoor options.  Pt voiced understanding. Meredith Russell is doing well in the program. She was able to increase her speed on the treadmill from 3.2mph to 3.74mph. She was also able to maintain her level on the T6 nustep and XR. We will continue to monitor her progress in the program.   Expected Outcomes Short: Use RPE daily to regulate intensity. Long: Follow program prescription in THR. Short: Continue to follow current exercise prescription. Long: Continue exercise to improve strength and stamina. Short: Begin to progressively  increase treadmill workload. Long: Continue exercise to improve strength and stamina. Short: add 1-2 days a week of exercise at home on off days of rehab. Long: become independent with exercise  routine. Short: try to increase to level 5 on the XR. Long: Continue exercise to improve strength and stamina.    Row Name 07/07/23 0953             Exercise Goal Re-Evaluation   Exercise Goals Review Increase Physical Activity;Increase Strength and Stamina;Understanding of Exercise Prescription       Comments Meredith Russell will be completing her post and graduating from the program soon. We talked about her continuing to exercise at home using her treadmill and stationary bike. She also has space to walk outside if the weather permits. She states that she has enjoyed coming to the program and plans to continue to exercise.       Expected Outcomes Short: Improve on post and graduate. Long: Continue to exercise independently.                Discharge Exercise Prescription (Final Exercise Prescription Changes):  Exercise Prescription Changes - 07/03/23 1500       Response to Exercise   Blood Pressure (Admit) 120/62    Blood Pressure (Exit) 100/58    Heart Rate (Admit) 114 bpm    Heart Rate (Exercise) 140 bpm    Heart Rate (Exit) 99 bpm    Rating of Perceived Exertion (Exercise) 15    Perceived Dyspnea (Exercise) 0    Symptoms none    Duration Continue with 30 min of aerobic exercise without signs/symptoms of physical distress.    Intensity THRR unchanged      Progression   Progression Continue to progress workloads to maintain intensity without signs/symptoms of physical distress.    Average METs 3.66      Resistance Training   Training Prescription Yes    Weight 5 lb    Reps 10-15      Interval Training   Interval Training No      Treadmill   MPH 3.7    Grade 2    Minutes 15    METs 4.85      REL-XR   Level 4    Minutes 15    METs 2.7      T5 Nustep   Level 5   T6   Minutes 15    METs 2.8      Home Exercise Plan   Plans to continue exercise at Home (comment)   walk, stationary bike, TM at home   Frequency Add 2 additional days to program exercise  sessions.    Initial Home Exercises Provided 06/23/23      Oxygen   Maintain Oxygen Saturation 88% or higher             Nutrition:  Target Goals: Understanding of nutrition guidelines, daily intake of sodium 1500mg , cholesterol 200mg , calories 30% from fat and 7% or less from saturated fats, daily to have 5 or more servings of fruits and vegetables.  Education: All About Nutrition: -Group instruction provided by verbal, written material, interactive activities, discussions, models, and posters to present general guidelines for heart healthy nutrition including fat, fiber, MyPlate, the role of sodium in heart healthy nutrition, utilization of the nutrition label, and utilization of this knowledge for meal planning. Follow up email sent as well. Written material given at graduation. Flowsheet Row Cardiac Rehab from 04/10/2023 in [facility] Cardiac and Pulmonary Rehab  Education need identified 04/10/23       Biometrics:  Pre Biometrics - 04/10/23 1606       Pre Biometrics   Height 5' 3 (1.6 m)    Weight 134 lb 9.6 oz (61.1 kg)    Waist Circumference 32.5 inches    Hip Circumference 37 inches    Waist to Hip Ratio 0.88 %    BMI (Calculated) 23.85    Single Leg Stand 23.4 seconds             Post Biometrics - 07/11/23 0941        Post  Biometrics   Height 5' 3 (1.6 m)    Weight 129 lb 11.2 oz (58.8 kg)    Waist Circumference 33 inches    Hip Circumference 37 inches    Waist to Hip Ratio 0.89 %    BMI (Calculated) 22.98    Single Leg Stand 30 seconds             Nutrition Therapy Plan and Nutrition Goals:  Nutrition Therapy & Goals - 04/10/23 1611       Intervention Plan   Intervention Prescribe, educate and counsel regarding individualized specific dietary modifications aiming towards targeted core components such as weight, hypertension, lipid management, diabetes, heart failure and other comorbidities.    Expected Outcomes Short Term Goal: Understand basic  principles of dietary content, such as calories, fat, sodium, cholesterol and nutrients.;Short Term Goal: A plan has been developed with personal nutrition goals set during dietitian appointment.;Long Term Goal: Adherence to prescribed nutrition plan.             Nutrition Assessments:  MEDIFICTS Score Key: >=70 Need to make dietary changes  40-70 Heart Healthy Diet <= 40 Therapeutic Level Cholesterol Diet  Flowsheet Row Cardiac Rehab from 07/11/2023 in [facility] Cardiac and Pulmonary Rehab  Picture Your Plate Total Score on Admission 68  Picture Your Plate Total Score on Discharge 69      Picture Your Plate Scores: <59 Unhealthy dietary pattern with much room for improvement. 41-50 Dietary pattern unlikely to meet recommendations for good health and room for improvement. 51-60 More healthful dietary pattern, with some room for improvement.  >60 Healthy dietary pattern, although there may be some specific behaviors that could be improved.    Nutrition Goals Re-Evaluation:  Nutrition Goals Re-Evaluation     Row Name 05/21/23 0945 06/18/23 0928 07/07/23 0949         Goals   Current Weight 135 lb (61.2 kg) -- --     Comment Patient deferred dietitian appointment Patient deferred dietitian appointment Patient deferred dietitian appointment              Nutrition Goals Discharge (Final Nutrition Goals Re-Evaluation):  Nutrition Goals Re-Evaluation - 07/07/23 0949       Goals   Comment Patient deferred dietitian appointment             Psychosocial: Target Goals: Acknowledge presence or absence of significant depression and/or stress, maximize coping skills, provide positive support system. Participant is able to verbalize types and ability to use techniques and skills needed for reducing stress and depression.   Education: Stress, Anxiety, and Depression - Group verbal and visual presentation to define topics covered.  Reviews how body is impacted by stress, anxiety,  and depression.  Also discusses healthy ways to reduce stress and to treat/manage anxiety and depression.  Written material given at graduation.   Education: Sleep Hygiene -Provides group verbal and written  instruction about how sleep can affect your health.  Define sleep hygiene, discuss sleep cycles and impact of sleep habits. Review good sleep hygiene tips.    Initial Review & Psychosocial Screening:  Initial Psych Review & Screening - 03/26/23 1013       Initial Review   Current issues with Current Sleep Concerns;Current Stress Concerns;Current Depression      Family Dynamics   Good Support System? Yes   husband, siblings, family     Barriers   Psychosocial barriers to participate in program There are no identifiable barriers or psychosocial needs.;The patient should benefit from training in stress management and relaxation.      Screening Interventions   Interventions Encouraged to exercise;Provide feedback about the scores to participant;To provide support and resources with identified psychosocial needs    Expected Outcomes Short Term goal: Utilizing psychosocial counselor, staff and physician to assist with identification of specific Stressors or current issues interfering with healing process. Setting desired goal for each stressor or current issue identified.;Long Term Goal: Stressors or current issues are controlled or eliminated.;Short Term goal: Identification and review with participant of any Quality of Life or Depression concerns found by scoring the questionnaire.;Long Term goal: The participant improves quality of Life and PHQ9 Scores as seen by post scores and/or verbalization of changes             Quality of Life Scores:   Quality of Life - 07/11/23 1011       Quality of Life Scores   Health/Function Pre 19.6 %    Health/Function Post 25.6 %    Health/Function % Change 30.61 %    Socioeconomic Post 28.21 %    Psych/Spiritual Pre 24.86 %    Psych/Spiritual  Post 24.86 %    Psych/Spiritual % Change 0 %    Family Pre 20.4 %    Family Post 27.6 %    Family % Change 35.29 %    GLOBAL Pre 22.59 %    GLOBAL Post 26.28 %    GLOBAL % Change 16.33 %            Scores of 19 and below usually indicate a poorer quality of life in these areas.  A difference of  2-3 points is a clinically meaningful difference.  A difference of 2-3 points in the total score of the Quality of Life Index has been associated with significant improvement in overall quality of life, self-image, physical symptoms, and general health in studies assessing change in quality of life.  PHQ-9: Review Flowsheet  More data exists      07/11/2023 07/07/2023 06/18/2023 05/21/2023 04/10/2023  Depression screen PHQ 2/9  Decreased Interest 0 0 2 2 1   Down, Depressed, Hopeless 0 0 1 1 1   PHQ - 2 Score 0 0 3 3 2   Altered sleeping 3 3 3 3 3   Tired, decreased energy 1 1 3 1 1   Change in appetite 2 1 3 2 1   Feeling bad or failure about yourself  0 0 0 0 0  Trouble concentrating 0 0 0 0 0  Moving slowly or fidgety/restless 0 0 0 0 1  Suicidal thoughts 0 0 0 0 0  PHQ-9 Score 6 5 12 9 8   Difficult doing work/chores Not difficult at all Not difficult at all Not difficult at all Very difficult Very difficult   Interpretation of Total Score  Total Score Depression Severity:  1-4 = Minimal depression, 5-9 = Mild depression, 10-14 = Moderate depression,  15-19 = Moderately severe depression, 20-27 = Severe depression   Psychosocial Evaluation and Intervention:  Psychosocial Evaluation - 03/26/23 1030       Psychosocial Evaluation & Interventions   Comments Meredith Russell is coming to cardiac rehab after stent placement. She lives a very active lifestyle and is interested in coming to the program to learn more about the best way to exercise given her recent heart event. She used to play a lot of sports and she is unable to do that with her arthritis and other joint issues, so she tries to stay busy  with other outdoor activities. She recently went off her depression medication because she is tired of taking medications. She was on it after one of her daughters passed away last year. She knows the grief will always be present and she is trying to spend extra time with her other daughter who doesn't live in [place]. When she is feeling down, she states she goes outside. She doesn't like to be inside, so she will find things to occupy her time outside. Her symptoms of dyspnea and chest discomfort made her originally think it was her lungs given her history of pneumonia,so she was surprised to find out it was her heart. 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 After her trip in October to visit her daughter, she is ready to get started in the program to learn more about heart healthy living.    Expected Outcomes Short: attend cardiac rehab for education and exercise. Long: develop and maintain positive self care habits    Continue Psychosocial Services  Follow up required by staff             Psychosocial Re-Evaluation:  Psychosocial Re-Evaluation     Row Name 05/21/23 [id number] 06/18/23 0926 07/07/23 0948         Psychosocial Re-Evaluation   Current issues with Current Stress Concerns Current Stress Concerns Current Stress Concerns     Comments Meredith Russell has lost her daughter over a year ago and still has some stress and sadness that comes on at times.Reviewed patient health questionnaire (PHQ-9) with patient for follow up. Previously, patients score indicated signs/symptoms of depression.  Reviewed to see if patient is improving symptom wise while in program.  Score declined and patient states that it is because of her health and her daughter being gone has made it more stressful. Reviewed patient health questionnaire (PHQ-9) with patient for follow up. Previously, patients score indicated signs/symptoms of depression.  Reviewed to see if patient is improving symptom wise  while in program.  Score declined and patient states that it is because she does not have much energy and is still dealing with the loss of her daughter. Reviewed patient health questionnaire (PHQ-9) with patient for follow up. Previously, patients score indicated signs/symptoms of depression.  Reviewed to see if patient is improving symptom wise while in program.  Score has improved since the last reviewed PHQ-9. Patient states that her stress mainly comes from not having much energy and she is still dealing with the loss of her daughter.     Expected Outcomes Short: Continue to work toward an improvement in PHQ9 scores by attending [facility] regularly. Long: Continue to improve stress and depression coping skills by talking with staff and attending [facility] regularly and work toward a positive mental state. Short: Continue to work toward an improvement in PHQ9 scores by attending [facility] regularly. Long: Continue to improve stress and depression  coping skills by talking with staff and attending [facility] regularly and work toward a positive mental state. Short: Continue to work toward an improvement in PHQ9 scores by attending [facility] regularly. Long: Continue to improve stress and depression coping skills by talking with staff and attending [facility] regularly and work toward a positive mental state.     Interventions Encouraged to attend Cardiac Rehabilitation for the exercise Encouraged to attend Cardiac Rehabilitation for the exercise Encouraged to attend Cardiac Rehabilitation for the exercise     Continue Psychosocial Services  Follow up required by staff Follow up required by staff Follow up required by staff              Psychosocial Discharge (Final Psychosocial Re-Evaluation):  Psychosocial Re-Evaluation - 07/07/23 0948       Psychosocial Re-Evaluation   Current issues with Current Stress Concerns    Comments Reviewed patient health questionnaire (PHQ-9) with patient  for follow up. Previously, patients score indicated signs/symptoms of depression.  Reviewed to see if patient is improving symptom wise while in program.  Score has improved since the last reviewed PHQ-9. Patient states that her stress mainly comes from not having much energy and she is still dealing with the loss of her daughter.    Expected Outcomes Short: Continue to work toward an improvement in PHQ9 scores by attending [facility] regularly. Long: Continue to improve stress and depression coping skills by talking with staff and attending [facility] regularly and work toward a positive mental state.    Interventions Encouraged to attend Cardiac Rehabilitation for the exercise    Continue Psychosocial Services  Follow up required by staff             Vocational Rehabilitation: Provide vocational rehab assistance to qualifying candidates.   Vocational Rehab Evaluation & Intervention:  Vocational Rehab - 07/11/23 1011       Discharge Vocational Rehab   Discharge Vocational Rehabilitation none required             Education: Education Goals: Education classes will be provided on a variety of topics geared toward better understanding of heart health and risk factor modification. Participant will state understanding/return demonstration of topics presented as noted by education test scores.  Learning Barriers/Preferences:  Learning Barriers/Preferences - 03/26/23 1010       Learning Barriers/Preferences   Learning Barriers None    Learning Preferences None             General Cardiac Education Topics:  AED/CPR: - Group verbal and written instruction with the use of models to demonstrate the basic use of the AED with the basic ABC's of resuscitation.   Anatomy and Cardiac Procedures: - Group verbal and visual presentation and models provide information about basic cardiac anatomy and function. Reviews the testing methods done to diagnose heart disease and the outcomes  of the test results. Describes the treatment choices: Medical Management, Angioplasty, or Coronary Bypass Surgery for treating various heart conditions including Myocardial Infarction, Angina, Valve Disease, and Cardiac Arrhythmias.  Written material given at graduation. Flowsheet Row Cardiac Rehab from 04/10/2023 in [facility]  Education need identified 04/10/23       Medication Safety: - Group verbal and visual instruction to review commonly prescribed medications for heart and lung disease. Reviews the medication, class of the drug, and side effects. Includes the steps to properly store meds and maintain the prescription regimen.  Written material given at graduation.   Intimacy: - Group verbal instruction through game  format to discuss how heart and lung disease can affect sexual intimacy. Written material given at graduation..   Know Your Numbers and Heart Failure: - Group verbal and visual instruction to discuss disease risk factors for cardiac and pulmonary disease and treatment options.  Reviews associated critical values for Overweight/Obesity, Hypertension, Cholesterol, and Diabetes.  Discusses basics of heart failure: signs/symptoms and treatments.  Introduces Heart Failure Zone chart for action plan for heart failure.  Written material given at graduation.   Infection Prevention: - Provides verbal and written material to individual with discussion of infection control including proper hand washing and proper equipment cleaning during exercise session. Flowsheet Row Cardiac Rehab from 04/10/2023 in [facility] Cardiac [facility] Pulmonary Rehab  Date 04/10/23  Educator NT  Instruction Review Code 1- Verbalizes Understanding       Falls Prevention: - Provides verbal and written material to individual with discussion of falls prevention and safety. Flowsheet Row Cardiac Rehab from 04/10/2023 in [facility] Cardiac and Pulmonary Rehab  Date 04/10/23  Educator NT  Instruction  Review Code 1- Verbalizes Understanding       Other: -Provides group and verbal instruction on various topics (see comments)   Knowledge Questionnaire Score:  Knowledge Questionnaire Score - 07/11/23 1010       Knowledge Questionnaire Score   Pre Score 20/26    Post Score 25/26             Core Components/Risk Factors/Patient Goals at Admission:  Personal Goals and Risk Factors at Admission - 03/26/23 1009       Core Components/Risk Factors/Patient Goals on Admission   Hypertension Yes    Intervention Provide education on lifestyle modifcations including regular physical activity/exercise, weight management, moderate sodium restriction and increased consumption of fresh fruit, vegetables, and low fat dairy, alcohol moderation, and smoking cessation.;Monitor prescription use compliance.    Expected Outcomes Short Term: Continued assessment and intervention until BP is < 140/78mm HG in hypertensive participants. < 130/12mm HG in hypertensive participants with diabetes, heart failure or chronic kidney disease.;Long Term: Maintenance of blood pressure at goal levels.    Lipids Yes    Intervention Provide education and support for participant on nutrition & aerobic/resistive exercise along with prescribed medications to achieve LDL 70mg , HDL >40mg .    Expected Outcomes Short Term: Participant states understanding of desired cholesterol values and is compliant with medications prescribed. Participant is following exercise prescription and nutrition guidelines.;Long Term: Cholesterol controlled with medications as prescribed, with individualized exercise RX and with personalized nutrition plan. Value goals: LDL < 70mg , HDL > 40 mg.             Education:Diabetes - Individual verbal and written instruction to review signs/symptoms of diabetes, desired ranges of glucose level fasting, after meals and with exercise. Acknowledge that pre and post exercise glucose checks will be done  for 3 sessions at entry of program.   Core Components/Risk Factors/Patient Goals Review:   Goals and Risk Factor Review     Row Name 05/21/23 0948 06/18/23 0929 07/07/23 0950         Core Components/Risk Factors/Patient Goals Review   Personal Goals Review Other -- Other     Review Meredith Russell states that she is going to continue rehab but is not sure if she is going to continue the whole program. She feels like she is doing well and would do exercise at home. Informed her to tell staff when she feels like she wants to be done. She states she would do  the program at least until the end of November. Meredith Russell takes her blood pressure at home and it has been running low. She was 110/60 today and has not had any issues. She feels tired alot at home and states maybe it is her blood pressure. Infomed her to talk to her doctor to talk about possible blood pressure medication changes. Meredith Russell states that her blood pressure Russell to run low and she believes this has contributed to her lack of energy. She states that she spoke with her doctor about her BP being lower and they switched her medication which has helped some. She also states that she has been losing some weight but would like to maintain where she is at this time. We encouraged her to continue to check BP regularly at home once she graduates from the program.     Expected Outcomes Short: continue exercise through November Long: exercise at home independently. short: consult doctor about blood pressure medications. Long: take medications as prescribed. Short: Continue to regularly check BP at home. Long: Continue to manage lifestyle risk factors.              Core Components/Risk Factors/Patient Goals at Discharge (Final Review):   Goals and Risk Factor Review - 07/07/23 0950       Core Components/Risk Factors/Patient Goals Review   Personal Goals Review Other    Review Meredith Russell states that her blood pressure Russell to run low and she believes this  has contributed to her lack of energy. She states that she spoke with her doctor about her BP being lower and they switched her medication which has helped some. She also states that she has been losing some weight but would like to maintain where she is at this time. We encouraged her to continue to check BP regularly at home once she graduates from the program.    Expected Outcomes Short: Continue to regularly check BP at home. Long: Continue to manage lifestyle risk factors.             ITP Comments:  ITP Comments     Row Name 03/26/23 1010 04/10/23 1557 05/07/23 0854 05/12/23 0806 05/28/23 0933   ITP Comments Initial phone call completed. Diagnosis can be found in [facility] 8/30. EP Orientation scheduled for 10/3 at 2:30. Completed and gym orientation. Initial ITP created and sent for review to Dr. Oneil Pinal, Medical Director. 30 Day review completed. Medical Director ITP review done, changes made as directed, and signed approval by Medical Director.   new to program First full day of exercise!  Patient was oriented to gym and equipment including functions, settings, policies, and procedures.  Patient's individual exercise prescription and treatment plan were reviewed.  All starting workloads were established based on the results of the 6 minute walk test done at initial orientation visit.  The plan for exercise progression was also introduced and progression will be customized based on patient's performance and goals. 30 Day review completed. Medical Director ITP review done, changes made as directed, and signed approval by Medical Director. new to program    Row Name 06/25/23 1147           ITP Comments 30 Day review completed. Medical Director ITP review done, changes made as directed, and signed approval by Medical Director.                Comments: Discharge ITP

## 2023-07-11 NOTE — Progress Notes (Signed)
 Discharge Note for  Minaal G Summons     1951-01-03        Nichole graduated today from  rehab with 36 sessions completed.  Details of the patient's exercise prescription and what She needs to do in order to continue the prescription and progress were discussed with patient.  Patient was given a copy of prescription and goals.  Patient verbalized understanding. [place] plans to continue to exercise by walking using equipment at home.    6 Minute Walk     Row Name 04/10/23 1604 07/11/23 0939       6 Minute Walk   Phase Initial Discharge    Distance 1610 feet 1875 feet    Distance % Change -- 16.5 %    Distance Feet Change -- 265 ft    Walk Time 6 minutes 6 minutes    # of Rest Breaks 0 0    MPH 3.05 3.55    METS 3.9 4.49    RPE 9 13    Perceived Dyspnea  0 0    VO2 Peak 13.66 15.71    Symptoms Yes (comment) No    Comments trouble taking a deep breath --    Resting HR 77 bpm 74 bpm    Resting BP 128/68 126/64    Resting Oxygen Saturation  98 % 97 %    Exercise Oxygen Saturation  during 6 min walk 97 % 92 %    Max Ex. HR 132 bpm 142 bpm    Max Ex. BP 152/64 154/68    2 Minute Post BP 136/60 --

## 2023-07-11 NOTE — Patient Instructions (Signed)
 Discharge Patient Instructions  Patient Details  Name: Meredith Russell MRN: [id number] Date of Birth: [birth year] Referring Provider:  Gasper Nancyann BRAVO, MD   Number of Visits: 22  Reason for Discharge:  Early Exit:  Personal   Diagnosis:  Status post coronary artery stent placement  Initial Exercise Prescription:  Initial Exercise Prescription - 04/10/23 1600       Date of Initial Exercise RX and Referring Provider   Date 04/10/23    Referring Provider Dr. Lonni Hanson, MD      Oxygen   Maintain Oxygen Saturation 88% or higher      Treadmill   MPH 2.8    Grade 1    Minutes 15    METs 3.53      NuStep   Level 3    SPM 80    Minutes 15    METs 3.9      T5 Nustep   Level 3    SPM 80    Minutes 15    METs 3.9      Prescription Details   Frequency (times per week) 3    Duration Progress to 30 minutes of continuous aerobic without signs/symptoms of physical distress      Intensity   THRR 40-80% of Max Heartrate 105-133    Ratings of Perceived Exertion 11-13    Perceived Dyspnea 0-4      Progression   Progression Continue to progress workloads to maintain intensity without signs/symptoms of physical distress.      Resistance Training   Training Prescription Yes    Weight 5 lb    Reps 10-15             Discharge Exercise Prescription (Final Exercise Prescription Changes):  Exercise Prescription Changes - 07/03/23 1500       Response to Exercise   Blood Pressure (Admit) 120/62    Blood Pressure (Exit) 100/58    Heart Rate (Admit) 114 bpm    Heart Rate (Exercise) 140 bpm    Heart Rate (Exit) 99 bpm    Rating of Perceived Exertion (Exercise) 15    Perceived Dyspnea (Exercise) 0    Symptoms none    Duration Continue with 30 min of aerobic exercise without signs/symptoms of physical distress.    Intensity THRR unchanged      Progression   Progression Continue to progress workloads to maintain intensity without signs/symptoms of physical  distress.    Average METs 3.66      Resistance Training   Training Prescription Yes    Weight 5 lb    Reps 10-15      Interval Training   Interval Training No      Treadmill   MPH 3.7    Grade 2    Minutes 15    METs 4.85      REL-XR   Level 4    Minutes 15    METs 2.7      T5 Nustep   Level 5   T6   Minutes 15    METs 2.8      Home Exercise Plan   Plans to continue exercise at Home (comment)   walk, stationary bike, TM at home   Frequency Add 2 additional days to program exercise sessions.    Initial Home Exercises Provided 06/23/23      Oxygen   Maintain Oxygen Saturation 88% or higher             Functional Capacity:  6 Minute  Walk     Row Name 04/10/23 1604 07/11/23 0939       6 Minute Walk   Phase Initial Discharge    Distance 1610 feet 1875 feet    Distance % Change -- 16.5 %    Distance Feet Change -- 265 ft    Walk Time 6 minutes 6 minutes    # of Rest Breaks 0 0    MPH 3.05 3.55    METS 3.9 4.49    RPE 9 13    Perceived Dyspnea  0 0    VO2 Peak 13.66 15.71    Symptoms Yes (comment) No    Comments trouble taking a deep breath --    Resting HR 77 bpm 74 bpm    Resting BP 128/68 126/64    Resting Oxygen Saturation  98 % 97 %    Exercise Oxygen Saturation  during 6 min walk 97 % 92 %    Max Ex. HR 132 bpm 142 bpm    Max Ex. BP 152/64 154/68    2 Minute Post BP 136/60 --            Nutrition & Weight - Outcomes:  Pre Biometrics - 04/10/23 1606       Pre Biometrics   Height 5' 3 (1.6 m)    Weight 134 lb 9.6 oz (61.1 kg)    Waist Circumference 32.5 inches    Hip Circumference 37 inches    Waist to Hip Ratio 0.88 %    BMI (Calculated) 23.85    Single Leg Stand 23.4 seconds             Post Biometrics - 07/11/23 0941        Post  Biometrics   Height 5' 3 (1.6 m)    Weight 129 lb 11.2 oz (58.8 kg)    Waist Circumference 33 inches    Hip Circumference 37 inches    Waist to Hip Ratio 0.89 %    BMI (Calculated) 22.98     Single Leg Stand 30 seconds             Nutrition:  Nutrition Therapy & Goals - 04/10/23 1611       Intervention Plan   Intervention Prescribe, educate and counsel regarding individualized specific dietary modifications aiming towards targeted core components such as weight, hypertension, lipid management, diabetes, heart failure and other comorbidities.    Expected Outcomes Short Term Goal: Understand basic principles of dietary content, such as calories, fat, sodium, cholesterol and nutrients.;Short Term Goal: A plan has been developed with personal nutrition goals set during dietitian appointment.;Long Term Goal: Adherence to prescribed nutrition plan.

## 2023-07-11 NOTE — Progress Notes (Signed)
 Daily Session Note  Patient Details  Name: Meredith Russell MRN: [id number] Date of Birth: Mar 10, 1951 Referring Provider:   Flowsheet Row Cardiac Rehab from 04/10/2023 in [facility] Cardiac and Pulmonary Rehab  Referring Provider Dr. Lonni Hanson, MD       Encounter Date: 07/11/2023  Check In:  Session Check In - 07/11/23 0937       Check-In   Supervising physician immediately available to respond to emergencies See telemetry face sheet for immediately available ER MD    Location [facility]-Cardiac & Pulmonary Rehab    Staff Present Othel Durand, RN, BSN, CCRP;Noah Tickle, BS, Exercise Physiologist;Maxon Conetta BS, , Exercise Physiologist    Virtual Visit No    Medication changes reported     No    Fall or balance concerns reported    No    Warm-up and Cool-down Performed on first and last piece of equipment    Resistance Training Performed Yes    VAD Patient? No    PAD/SET Patient? No      Pain Assessment   Currently in Pain? No/denies                Social History   Tobacco Use  Smoking Status Former   Current packs/day: 0.00   Types: Cigarettes   Quit date: 11/22/1987   Years since quitting: 35.6  Smokeless Tobacco Never    Goals Met:  Independence with exercise equipment Exercise tolerated well No report of concerns or symptoms today  Goals Unmet:  Not Applicable  Comments:  Angelynn graduated today from  rehab with 36 sessions completed.  Details of the patient's exercise prescription and what She needs to do in order to continue the prescription and progress were discussed with patient.  Patient was given a copy of prescription and goals.  Patient verbalized understanding. Mattye plans to continue to exercise by walking using equipment at home.     Dr. Oneil Pinal is Medical Director for [facility] Cardiac Rehabilitation.  Dr. Fuad Aleskerov is Medical Director for [facility] Pulmonary Rehabilitation.

## 2023-07-14 ENCOUNTER — Encounter: Payer: Medicare Other | Admitting: *Deleted

## 2023-08-05 ENCOUNTER — Ambulatory Visit: Payer: Medicare Other | Attending: Cardiology | Admitting: Cardiology

## 2023-08-05 ENCOUNTER — Other Ambulatory Visit: Payer: Self-pay | Admitting: Family Medicine

## 2023-08-05 ENCOUNTER — Encounter: Payer: Self-pay | Admitting: Cardiology

## 2023-08-05 VITALS — BP 127/71 | HR 74 | Ht 63.0 in | Wt 126.0 lb

## 2023-08-05 DIAGNOSIS — I10 Essential (primary) hypertension: Secondary | ICD-10-CM

## 2023-08-05 DIAGNOSIS — E782 Mixed hyperlipidemia: Secondary | ICD-10-CM | POA: Diagnosis not present

## 2023-08-05 DIAGNOSIS — J069 Acute upper respiratory infection, unspecified: Secondary | ICD-10-CM

## 2023-08-05 DIAGNOSIS — Z01818 Encounter for other preprocedural examination: Secondary | ICD-10-CM

## 2023-08-05 DIAGNOSIS — I2510 Atherosclerotic heart disease of native coronary artery without angina pectoris: Secondary | ICD-10-CM | POA: Diagnosis not present

## 2023-08-05 DIAGNOSIS — Z01810 Encounter for preprocedural cardiovascular examination: Secondary | ICD-10-CM

## 2023-08-05 DIAGNOSIS — L4050 Arthropathic psoriasis, unspecified: Secondary | ICD-10-CM

## 2023-08-05 DIAGNOSIS — M069 Rheumatoid arthritis, unspecified: Secondary | ICD-10-CM

## 2023-08-05 MED ORDER — BENZONATATE 100 MG PO CAPS: 100.0000 mg | ORAL_CAPSULE | Freq: Three times a day (TID) | ORAL | 0 refills | Status: DC | PRN

## 2023-08-05 NOTE — Patient Instructions (Addendum)
Medication Instructions:   Start Benzonatate 100 mg tablet- Take 1 tablet by mouth 3 times daily as needed.   *If you need a refill on your cardiac medications before your next appointment, please call your pharmacy*   Lab Work:  NONE TODAY  If you have labs (blood work) drawn today and your tests are completely normal, you will receive your results only by: MyChart Message (if you have MyChart) OR A paper copy in the mail If you have any lab test that is abnormal or we need to change your treatment, we will call you to review the results.   Testing/Procedures:  Your provider has ordered a Lexiscan/ Exercise Myoview Stress test. This will take place at [facility]. Please report to the [facility] medical mall entrance. The volunteers at the first desk will direct you where to go.  [facility] MYOVIEW  Your provider has ordered a Stress Test with nuclear imaging. The purpose of this test is to evaluate the blood supply to your heart muscle. This procedure is referred to as a "Non-Invasive Stress Test." This is because other than having an IV started in your vein, nothing is inserted or "invades" your body. Cardiac stress tests are done to find areas of poor blood flow to the heart by determining the extent of coronary artery disease (CAD). Some patients exercise on a treadmill, which naturally increases the blood flow to your heart, while others who are unable to walk on a treadmill due to physical limitations will have a pharmacologic/chemical stress agent called Lexiscan . This medicine will mimic walking on a treadmill by temporarily increasing your coronary blood flow.   Please note: these test may take anywhere between 2-4 hours to complete  How to prepare for your Myoview test:  Nothing to eat for 6 hours prior to the test No caffeine for 24 hours prior to test No smoking 24 hours prior to test. Your medication may be taken with water.  If your doctor stopped a medication because of this test, do not  take that medication. Ladies, please do not wear dresses.  Skirts or pants are appropriate. Please wear a short sleeve shirt. No perfume, cologne or lotion. Wear comfortable walking shoes. No heels!   PLEASE NOTIFY THE OFFICE AT LEAST 24 HOURS IN ADVANCE IF YOU ARE UNABLE TO KEEP YOUR APPOINTMENT.  [phone] AND  PLEASE NOTIFY NUCLEAR MEDICINE AT [facility] AT LEAST 24 HOURS IN ADVANCE IF YOU ARE UNABLE TO KEEP YOUR APPOINTMENT. [phone]     Follow-Up: At [facility], you and your health needs are our priority.  As part of our continuing mission to provide you with exceptional heart care, we have created designated Provider Care Teams.  These Care Teams include your primary Cardiologist (physician) and Advanced Practice Providers (APPs -  Physician Assistants and Nurse Practitioners) who all work together to provide you with the care you need, when you need it.  We recommend signing up for the patient portal called "MyChart".  Sign up information is provided on this After Visit Summary.  MyChart is used to connect with patients for Virtual Visits (Telemedicine).  Patients are able to view lab/test results, encounter notes, upcoming appointments, etc.  Non-urgent messages can be sent to your provider as well.   To learn more about what you can do with MyChart, go to [email or web address].    Your next appointment:   3-4 month(s)  Provider:   You may see Yvonne Kendall, MD or one of the following Advanced Practice  Providers on your designated Care Team:   Nicolasa Ducking, NP Eula Listen, PA-C Cadence Fransico Michael, PA-C Charlsie Quest, NP Carlos Levering, NP

## 2023-08-05 NOTE — Progress Notes (Signed)
Cardiology Office Note:  .   Date:  08/05/2023  ID:  Meredith Russell, DOB 1950-09-26, MRN [id number] PCP: Malva Limes, MD  [facility] Providers Cardiologist:  Yvonne Kendall, MD    History of Present Illness: .   Meredith Russell is a 73 y.o. female with past medical history of hypertension, rheumatoid arthritis, psoriatic arthritis, Raynaud's migraines, GERD, coronary artery disease is, recent NSTEMI, who is here today for follow-up.   Meredith Russell no prior cardiovascular history but showed a longstanding history of rheumatoid arthritis and psoriatic arthritis with Raynaud's disease and previously been followed at [facility].  She was also former smoker.  She also noted that her family are strong family history of early onset coronary artery disease.  She had presented to the [facility] emergency department and being evaluated in [facility] urgent care for the complaint of chest pain that has been intermittent for the last 4 months.  She described the chest pain as heaviness and tightness that was substernal and radiated up into her neck and jaw with exertion that resolved with rest after 1 to 2 minutes.  She has an associated mild nausea.  With her rheumatoid arthritis multiple medication changes have been made she was unsure whether the discomfort was cardiac related to her arthritis.  Initial vital signs were blood pressure 145/76, pulse 65, respiration 18, 98% on room air.  Labs were pertinent for sodium 137, potassium 4 with her, glucose 126, BUN of 18, serum creatinine 0.91, WBCs 11.7, hemoglobin 12.5, hematocrit 38.9, and high-sensitivity troponin of 46 x 2.  Echocardiogram completed revealed LVEF of 60 to 65%, no RWMA, trivial mitral regurgitation.  She underwent cardiac catheterization on 03/11/2023 which revealed severe multivessel coronary artery disease that included 95% mid LAD stenosis as well as chronic total occlusions of small D1 and OM 2 branches.  There was moderate multifocal left circumflex/OM  disease and 70% mid left circumflex stenosis that was not hemodynamically significant by flow assessment with RFR 0.93 RCA demonstrated mild to moderate nonobstructive disease.  She underwent successful PCI/DES to the mid LAD.  She was continued on dual antiplatelet therapy for 12 months is symptomatic bradycardia became an issue she could be transitioned to clopidogrel.  She was also advised to continue secondary prevention of coronary artery disease favoring medical therapy of left circumflex/OM/D1.  She was considered stable for discharge but subsequently discharged in the facility on 03/12/23.    She was last seen in clinic 03/20/2023.  She been doing well.  She denied any chest discomfort or shortness of breath.  She was continued on her current medication regimen.  She was also scheduled for an updated labs and referred to cardiac rehab.  She returns to clinic today with a continued cough where she has recently bee treated for community-acquired pneumonia.  She is just finished her antibiotics but continues on steroid therapy.  She has productive cough and continues to remain congested.  She says she also has follow-up upcoming with her neurosurgeon about a back surgery that she is supposed.  She has questions today about what testing she would need and what to do about her clopidogrel as she had stent placement completed in September 2024.  She states that she has been compliant with her current medication regimen without any adverse side effects.  She denies any recurrent hospitalizations or visits to the emergency department.  She stated that she did graduated from cardiac rehab and enjoyed her time there.  She also stated  that she occasionally has back pain that shoots down her legs and she has also noted some unintentional weight loss.  She continues to monitor blood pressures at home and has been keeping a log several months ago she had noticed that her blood pressures running systolic below 100 and her  diastolic was 40.  She stated that over the past 3 weeks her blood pressure has returned back to baseline.   ROS: 10 point review of systems has been reviewed and considered negative except what is been listed in HPI  Studies Reviewed: Marland Kitchen        [facility] 03/11/23  Conclusions: Severe multivessel coronary artery disease, as detailed below.  This includes 95% mid LAD stenosis as well as chronic total occlusions of small D1 and OM 2 branches.  There is moderate multifocal LCx/OM disease; the 70% mid LCx stenosis is not hemodynamically significant by flow assessment (RFR = 0.93).  RCA demonstrates mild to moderate, nonobstructive disease. 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 Normal left ventricular filling pressure (LVEDP 13 mmHg). Successful PCI to mid LAD using Onyx Frontier 2.75 x 18 mm drug-eluting stent with 0% residual stenosis and TIMI-3 flow. Tortuous right brachial and innominate arteries; consider use of a long sheath or alternative access if future catheterizations are needed.   Recommendations: Complete 2 hours of cangrelor infusion. Dual antiplatelet therapy with aspirin and ticagrelor for at least 12 months.  If symptomatic bradycardia is an issue, the patient could be transitioned to clopidogrel. Aggressive secondary prevention of coronary artery disease.  Favor medical therapy of LCx/OM/D1 disease.   TTE 03/08/23 1. Left ventricular ejection fraction, by estimation, is 60 to 65%. The  left ventricle has normal function. The left ventricle has no regional  wall motion abnormalities. Left ventricular diastolic parameters were  normal. The average left ventricular  global longitudinal strain is -18.9 %. The global longitudinal strain is  normal.   2. Right ventricular systolic function is normal. The right ventricular  size is normal. There is normal pulmonary artery systolic pressure.   3. The mitral valve is normal in structure. Trivial  mitral valve  regurgitation. No evidence of mitral stenosis.   4. The aortic valve is tricuspid. There is mild calcification of the  aortic valve. There is mild thickening of the aortic valve. Aortic valve  regurgitation is trivial. No aortic stenosis is present.   5. The inferior vena cava is normal in size with greater than 50%  respiratory variability, suggesting right atrial pressure of 3 mmHg.  Risk Assessment/Calculations:             Physical Exam:   VS:  BP 127/71   Pulse 74   Ht 5\' 3"  (1.6 m)   Wt 126 lb (57.2 kg)   SpO2 94%   BMI 22.32 kg/m    Wt Readings from Last 3 Encounters:  08/05/23 126 lb (57.2 kg)  07/11/23 129 lb 11.2 oz (58.8 kg)  05/02/23 135 lb 3.2 oz (61.3 kg)    GEN: Well nourished, well developed in no acute distress NECK: No JVD; No carotid bruits CARDIAC: RRR, no murmurs, rubs, gallops RESPIRATORY: Clear, lobes with diminished bases with forced expiratory wheezing noted throughout to auscultation respirations are unlabored at rest on room air, congested cough that is nonproductive ABDOMEN: Soft, non-tender, non-distended EXTREMITIES:  No edema; No deformity   ASSESSMENT AND PLAN: .   Coronary artery disease with recent PCI/DES to mid LAD  on 03/28/2023.  Left heart catheterization revealed severe multivessel coronary artery occlusions that includes 95% mid LAD stenosis as well as chronic total occlusions of small D1 and OM 2 branches.  There is moderate multifocal left circumflex/OM disease, 70% mid left circumflex stenosis not hemodynamically significant by RFR.  RCA demonstrates mild to moderate nonobstructive disease.  Tortuous right brachial and nondominant arteries considering use of a long sheath or alternative access for future catheterizations are needed.  Patient was continued on DAPT originally with aspirin and Brilinta that was changed to clopidogrel.  As well as his rosuvastatin 10 mg daily.  She has occasional episodes of fleeting chest  discomfort, which she is not sure if it is chest pain or if it is her heart rate.  She was educated on the use of Nitrostat today.  She had previous episodes of bradycardia which prevented prior use of beta-blocker therapy.  Mixed hyperlipidemia with an LDL of 51.  She is continued on rosuvastatin 10 mg daily.  Primary hypertension with blood pressure today 127/71.  Blood pressures remain stable.  She is continued on amlodipine 10 mg daily she has been encouraged to monitor pressures 1 to 2 hours postmedication administration at home as well.  She did have recent issues with mild drops in her blood pressure.  She states her blood pressure has also returned to normal over the last 3 weeks.  Rheumatoid psoriatic arthritis.  She is continued on chronic pain medications.  Ongoing management per primary team.  Upper respiratory infection which she was treated for community-acquired pneumonia.  She continues to congested nonproductive cough and expiratory wheezing noted throughout.  Chest x-ray did not show infiltrate or consolidation.  She has finished with her antibiotic therapy as she was given cefdinir and doxycycline.  With her continued cough refill has been sent in for Tessalon Perles 100 mg up to 3 times daily for the next 10 days per the patient's request pharmacy of choice.  Preoperative cardiovascular examination    Ms. Zanetti's perioperative risk of a major cardiac event is 6.6% according to the Revised Cardiac Risk Index (RCRI).  Therefore, she is at high risk for perioperative complications.   Her functional capacity is fair at 5.62 METs according to the Duke Activity Status Index (DASI). With continued episodes of chest discomfort that are fleeting and extensive surgery.  She has been scheduled for Lexiscan Myoview to rule out any ischemia prior to surgery.  She has also been advised that she is not able to stop her clopidogrel until at least the 61-month mark which would be in March.  She was  advised that if her Eugenie Birks showed no new ischemia she would be cleared to undergo her surgery at acceptable risk.    Informed Consent   Shared Decision Making/Informed Consent The risks [chest pain, shortness of breath, cardiac arrhythmias, dizziness, blood pressure fluctuations, myocardial infarction, stroke/transient ischemic attack, nausea, vomiting, allergic reaction, radiation exposure, metallic taste sensation and life-threatening complications (estimated to be 1 in 10,000)], benefits (risk stratification, diagnosing coronary artery disease, treatment guidance) and alternatives of a nuclear stress test were discussed in detail with Ms. Schechter and she agrees to proceed.     Dispo: Patient return to clinic to see MD/APP in 3 to 4 months or sooner if needed for reevaluation of symptoms.  Patient has also been advised that if her testing comes back abnormal and she will need further testing completed that her appointment will be moved up and she will have to  return sooner.  Signed, Arneshia Ade, NP

## 2023-08-07 ENCOUNTER — Other Ambulatory Visit: Payer: Self-pay | Admitting: Family Medicine

## 2023-08-07 ENCOUNTER — Telehealth: Payer: Self-pay | Admitting: Family Medicine

## 2023-08-07 DIAGNOSIS — R9389 Abnormal findings on diagnostic imaging of other specified body structures: Secondary | ICD-10-CM

## 2023-08-07 NOTE — Progress Notes (Signed)
Order for high resolution CT placed

## 2023-08-07 NOTE — Telephone Encounter (Signed)
Mrs Poehlman called in this morning and stated that she went to [facility] in Clinic because she was sick and they did an xray. They called this morning and told her that they saw something on the xray on her lungs. They are requesting that Dr Sherrie Mustache request a ct scan of the lungs to do indepth testing to see what it is. She is also scheduled Feb 3 for a ct scan with contrast on Feb 3 for her cardiologist, Hedy Camara, [place] [phone]) from [facility] in [place].   She is requesting that both ct scans be scheduled together if possible so that she doesn't have to have 2 different ones if this is possible. Please advise.   Thank you.

## 2023-08-11 ENCOUNTER — Encounter
Admission: RE | Admit: 2023-08-11 | Discharge: 2023-08-11 | Disposition: A | Discharge status: Home / Self Care | Payer: Medicare Other | Source: Ambulatory Visit | Attending: Cardiology | Admitting: Cardiology

## 2023-08-11 ENCOUNTER — Ambulatory Visit
Admission: RE | Admit: 2023-08-11 | Discharge: 2023-08-11 | Disposition: A | Discharge status: Home / Self Care | Payer: Medicare Other | Source: Ambulatory Visit | Attending: Family Medicine | Admitting: Family Medicine

## 2023-08-11 DIAGNOSIS — R9389 Abnormal findings on diagnostic imaging of other specified body structures: Secondary | ICD-10-CM | POA: Insufficient documentation

## 2023-08-11 DIAGNOSIS — J479 Bronchiectasis, uncomplicated: Secondary | ICD-10-CM | POA: Diagnosis not present

## 2023-08-11 DIAGNOSIS — J84112 Idiopathic pulmonary fibrosis: Secondary | ICD-10-CM | POA: Insufficient documentation

## 2023-08-11 DIAGNOSIS — Z01810 Encounter for preprocedural cardiovascular examination: Secondary | ICD-10-CM | POA: Diagnosis not present

## 2023-08-11 DIAGNOSIS — Z01818 Encounter for other preprocedural examination: Secondary | ICD-10-CM | POA: Diagnosis present

## 2023-08-11 DIAGNOSIS — I2510 Atherosclerotic heart disease of native coronary artery without angina pectoris: Secondary | ICD-10-CM | POA: Insufficient documentation

## 2023-08-11 DIAGNOSIS — I700 Atherosclerosis of aorta: Secondary | ICD-10-CM | POA: Insufficient documentation

## 2023-08-11 LAB — NM MYOCAR MULTI W/SPECT W/WALL MOTION / EF
Estimated workload: 1
Exercise duration (min): 0 min
Exercise duration (sec): 0 s
LV dias vol: 64 mL (ref 46–106)
LV sys vol: 18 mL
MPHR: 146 {beats}/min
Nuc Stress EF: 72 %
Peak HR: 99 {beats}/min
Percent HR: 67 %
Rest HR: 66 {beats}/min
Rest Nuclear Isotope Dose: 10.6 mCi
SDS: 3
SRS: 0
SSS: 1
ST Depression (mm): 0 mm
Stress Nuclear Isotope Dose: 30.4 mCi
TID: 0.83

## 2023-08-11 MED ORDER — REGADENOSON 0.4 MG/5ML IV SOLN
0.4000 mg | Freq: Once | INTRAVENOUS | Status: AC
  Administered 2023-08-11: 0.4 mg via INTRAVENOUS

## 2023-08-11 MED ORDER — TECHNETIUM TC 99M TETROFOSMIN IV KIT
10.0000 | PACK | Freq: Once | INTRAVENOUS | Status: AC | PRN
  Administered 2023-08-11: 10.6 via INTRAVENOUS

## 2023-08-11 MED ORDER — TECHNETIUM TC 99M TETROFOSMIN IV KIT
30.3700 | PACK | Freq: Once | INTRAVENOUS | Status: AC | PRN
  Administered 2023-08-11: 30.37 via INTRAVENOUS

## 2023-08-12 ENCOUNTER — Other Ambulatory Visit: Payer: Medicare Other

## 2023-08-12 NOTE — Progress Notes (Signed)
Stress testing was considered low risk and the study was normal.  There is no evidence of ischemia or infarct.  Overall reassuring study.

## 2023-08-22 ENCOUNTER — Encounter: Payer: Self-pay | Admitting: Family Medicine

## 2023-08-22 ENCOUNTER — Other Ambulatory Visit: Payer: Self-pay | Admitting: Family Medicine

## 2023-08-22 DIAGNOSIS — R9389 Abnormal findings on diagnostic imaging of other specified body structures: Secondary | ICD-10-CM

## 2023-08-22 DIAGNOSIS — R918 Other nonspecific abnormal finding of lung field: Secondary | ICD-10-CM

## 2023-09-17 ENCOUNTER — Other Ambulatory Visit: Payer: Self-pay | Admitting: Family Medicine

## 2023-09-17 DIAGNOSIS — J301 Allergic rhinitis due to pollen: Secondary | ICD-10-CM

## 2023-09-22 ENCOUNTER — Telehealth: Payer: Self-pay

## 2023-09-22 NOTE — Telephone Encounter (Signed)
 Copied from CRM [id number]. Topic: General - Other >> Sep 22, 2023  1:54 PM Truddie Crumble wrote: Reason for CRM: patient called wanting to speak to Okey Regal at the office about a DNR

## 2023-11-04 ENCOUNTER — Encounter: Payer: Self-pay | Admitting: Cardiology

## 2023-11-04 ENCOUNTER — Ambulatory Visit: Payer: Medicare Other | Attending: Cardiology | Admitting: Cardiology

## 2023-11-04 VITALS — BP 126/68 | HR 70 | Ht 63.0 in | Wt 129.6 lb

## 2023-11-04 DIAGNOSIS — L4050 Arthropathic psoriasis, unspecified: Secondary | ICD-10-CM

## 2023-11-04 DIAGNOSIS — I10 Essential (primary) hypertension: Secondary | ICD-10-CM | POA: Diagnosis not present

## 2023-11-04 DIAGNOSIS — E782 Mixed hyperlipidemia: Secondary | ICD-10-CM

## 2023-11-04 DIAGNOSIS — I2510 Atherosclerotic heart disease of native coronary artery without angina pectoris: Secondary | ICD-10-CM

## 2023-11-04 DIAGNOSIS — M069 Rheumatoid arthritis, unspecified: Secondary | ICD-10-CM

## 2023-11-04 NOTE — Patient Instructions (Signed)
 Medication Instructions:  Your physician recommends that you continue on your current medications as directed. Please refer to the Current Medication list given to you today.   *If you need a refill on your cardiac medications before your next appointment, please call your pharmacy*  Lab Work: No labs ordered today   Testing/Procedures: No test ordered today   Follow-Up: At [facility], you and your health needs are our priority.  As part of our continuing mission to provide you with exceptional heart care, our providers are all part of one team.  This team includes your primary Cardiologist (physician) and Advanced Practice Providers or APPs (Physician Assistants and Nurse Practitioners) who all work together to provide you with the care you need, when you need it.  Your next appointment:   5 -6 month(s)  Provider:   Sammy Crisp, MD

## 2023-11-04 NOTE — Progress Notes (Signed)
 Cardiology Office Note:  .   Date:  11/04/2023  ID:  Meredith Russell, DOB 10-15-1950, MRN [id number] PCP: Meredith Pillow, MD  [facility] Providers Cardiologist:  Meredith Crisp, MD    History of Present Illness: .   Meredith Russell is a 73 y.o. female with past medical history of pulm hypertension, rheumatoid arthritis, psoriatic arthritis, Raynaud's, migraines, GERD, coronary artery disease with recent NSTEMI, who presents today for follow-up on coronary artery disease.   Meredith Russell no prior cardiovascular history but showed a longstanding history of rheumatoid arthritis and psoriatic arthritis with Raynaud's disease and previously been followed at [facility].  She was also former smoker.  She also noted that her family are strong family history of early onset coronary artery disease.  She had presented to the [facility] emergency department and being evaluated in [facility] urgent care for the complaint of chest pain that has been intermittent for the last 4 months.  She described the chest pain as heaviness and tightness that was substernal and radiated up into her neck and jaw with exertion that resolved with rest after 1 to 2 minutes.  She has an associated mild nausea.  With her rheumatoid arthritis multiple medication changes have been made she was unsure whether the discomfort was cardiac related to her arthritis.  Initial vital signs were blood pressure 145/76, pulse 65, respiration 18, 98% on room air.  Labs were pertinent for sodium 137, potassium 4 with her, glucose 126, BUN of 18, serum creatinine 0.91, WBCs 11.7, hemoglobin 12.5, hematocrit 38.9, and high-sensitivity troponin of 46 x 2.  Echocardiogram completed revealed LVEF of 60 to 65%, no RWMA, trivial mitral regurgitation.  She underwent cardiac catheterization on 03/11/2023 which revealed severe multivessel coronary artery disease that included 95% mid LAD stenosis as well as chronic total occlusions of small D1 and OM 2 branches.  There was  moderate multifocal left circumflex/OM disease and 70% mid left circumflex stenosis that was not hemodynamically significant by flow assessment with RFR 0.93 RCA demonstrated mild to moderate nonobstructive disease.  She underwent successful PCI/DES to the mid LAD.  She was continued on dual antiplatelet therapy for 12 months is symptomatic bradycardia became an issue she could be transitioned to clopidogrel .  She was also advised to continue secondary prevention of coronary artery disease favoring medical therapy of left circumflex/OM/D1.  She was considered stable for discharge but subsequently discharged in the facility on 03/12/23.    She was last seen in clinic 07/28/2023 where she had continued cough and recently been treated for CAP.  She just finished antibiotics and was continued on steroid therapy.  She had continued fleeting chest discomfort and was scheduled for Lexiscan  Myoview  view for surgical clearance.  Study was completed and was considered low risk.  She returns clinic today stating that she has been doing well from the cardiac perspective.  She states that she has been under increased amount of stress as of late and did take 1 Nitrostat  for some chest twinges but was unsure if she did not appropriately or not but symptoms had resolved.  There were no other associated symptoms.  She continues to have increased amount of bruising and is questioning when she will be able to come off of clopidogrel  if possible.  She also noted on occasion she has some swelling to her ankles but worse on the left foot due to recent broken toes. she had recently traveled to the beach to see her daughter.  Unfortunately  she is also just recently lost a brother and had to put his dog down.  She has been compliant with her current medication regimen without any undue side effects.  She denies any hospitalization or visits to the emergency department.  ROS: 10 point review of system has been reviewed and considered  negative except ones been listed in the HPI  Studies Reviewed: Aaron Meredith Russell   EKG Interpretation Date/Time:  Tuesday November 04 2023 09:16:57 EDT Ventricular Rate:  70 PR Interval:  124 QRS Duration:  74 QT Interval:  398 QTC Calculation: 429 R Axis:   -3  Text Interpretation: Normal sinus rhythm Normal ECG When compared with ECG of 20-Mar-2023 15:17, No significant change was found Confirmed by Meredith Russell ([id number]) on 11/04/2023 9:21:33 AM    Lexiscan  Myoview  08/11/2023   The study is normal. The study is low risk.   No ST deviation was noted. The ECG was negative for ischemia.   LV perfusion is normal. There is no evidence of ischemia. There is no evidence of infarction.   Left ventricular function is normal. Nuclear stress EF: 72%. End diastolic cavity size is normal. End systolic cavity size is normal.   CT attenuation images showed moderate aortic and coronary calcifications.   LHC 03/11/23  Conclusions: Severe multivessel coronary artery disease, as detailed below.  This includes 95% mid LAD stenosis as well as chronic total occlusions of small D1 and OM 2 branches.  There is moderate multifocal LCx/OM disease; the 70% mid LCx stenosis is not hemodynamically significant by flow assessment (RFR = 0.93).  RCA demonstrates mild to moderate, nonobstructive disease. 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 Normal left ventricular filling pressure (LVEDP 13 mmHg). Successful PCI to mid LAD using Onyx Frontier 2.75 x 18 mm drug-eluting stent with 0% residual stenosis and TIMI-3 flow. Tortuous right brachial and innominate arteries; consider use of a long sheath or alternative access if future catheterizations are needed.   Recommendations: Complete 2 hours of cangrelor  infusion. Dual antiplatelet therapy with aspirin  and ticagrelor  for at least 12 months.  If symptomatic bradycardia is an issue, the patient could be transitioned to clopidogrel . Aggressive  secondary prevention of coronary artery disease.  Favor medical therapy of LCx/OM/D1 disease.   TTE 03/08/23 1. Left ventricular ejection fraction, by estimation, is 60 to 65%. The  left ventricle has normal function. The left ventricle has no regional  wall motion abnormalities. Left ventricular diastolic parameters were  normal. The average left ventricular  global longitudinal strain is -18.9 %. The global longitudinal strain is  normal.   2. Right ventricular systolic function is normal. The right ventricular  size is normal. There is normal pulmonary artery systolic pressure.   3. The mitral valve is normal in structure. Trivial mitral valve  regurgitation. No evidence of mitral stenosis.   4. The aortic valve is tricuspid. There is mild calcification of the  aortic valve. There is mild thickening of the aortic valve. Aortic valve  regurgitation is trivial. No aortic stenosis is present.   5. The inferior vena cava is normal in size with greater than 50%  respiratory variability, suggesting right atrial pressure of 3 mmHg.  Risk Assessment/Calculations:             Physical Exam:   VS:  BP 126/68   Pulse 70   Ht 5\' 3"  (1.6 m)   Wt 129 lb 9.6 oz (58.8 kg)   SpO2 98%  BMI 22.96 kg/m    Wt Readings from Last 3 Encounters:  11/04/23 129 lb 9.6 oz (58.8 kg)  08/05/23 126 lb (57.2 kg)  07/11/23 129 lb 11.2 oz (58.8 kg)    GEN: Well nourished, well developed in no acute distress NECK: No JVD; No carotid bruits CARDIAC: RRR, no murmurs, rubs, gallops RESPIRATORY:  Clear to auscultation without rales, wheezing or rhonchi  ABDOMEN: Soft, non-tender, non-distended EXTREMITIES: Trace pretibial edema; No deformity, bruises and swelling noted to the toes of the left foot where she thinks she potentially has broken toes  ASSESSMENT AND PLAN: .   Coronary artery disease status post PCI/DES to the mid LAD on 03/28/2023.  Left heart catheterization revealed severe multivessel coronary  artery occlusion that included 95% mid LAD stenosis as well as chronic total occlusions of the small D1 and OM 2 branches.  There is moderate multifocal left circumflex/OM disease, 70% mid left circumflex stenosis not hemodynamically significant by RFR.  RCA demonstrated mild to moderate nonobstructive disease.  She was continued on DAPT originally with aspirin  and Brilinta  but was changed clopidogrel . Not on beta-blocker due to bradycardia while hospitalized.  Denies any recurrent chest pain today.  Recently underwent Lexiscan  Myoview  which was considered low risk study.  She had questions today about coming off of her Plavix  at the end of August 1 September his original report said to continue DAPT for minimum of 12 months. She has been maintained on aspirin  81 mg daily, clopidogrel  75 mg daily rosuvastatin  10 mg daily.  EKG today reveals sinus rhythm at a rate of 70 with no acute changes noted.  Mixed hyperlipidemia with an LDL of 51 continues to remain at goal of less than 55.  She is continued on rosuvastatin  10 mg daily.  Primary hypertension with a blood pressure today 126/68.  Blood pressure remained stable.  He is currently not requiring the use of any antihypertensive medications.  She has been encouraged to continue to monitor pressures at home.  Rheumatoid/ psoriatic arthritis which she is continued on her chronic pain medications.  Ongoing management per primary team.       Dispo: Patient return to clinic to see primary cardiologist/APP within the next 5 to 6 months to reassess possible discontinuation of antiplatelet medications.  Signed, Archie Shea, NP

## 2023-11-28 ENCOUNTER — Telehealth: Payer: Self-pay

## 2023-11-28 DIAGNOSIS — G4700 Insomnia, unspecified: Secondary | ICD-10-CM

## 2023-11-28 NOTE — Telephone Encounter (Signed)
 Pt called stated she receives a hand written prescription for zolpidem  (AMBIEN ) 10 MG tablet 90 day supply. Please call [phone] pt when she can pick up prescription.

## 2023-12-01 ENCOUNTER — Emergency Department
Admission: EM | Admit: 2023-12-01 | Discharge: 2023-12-01 | Disposition: A | Discharge status: Home / Self Care | Attending: Emergency Medicine | Admitting: Emergency Medicine

## 2023-12-01 ENCOUNTER — Emergency Department

## 2023-12-01 ENCOUNTER — Encounter: Payer: Self-pay | Admitting: Emergency Medicine

## 2023-12-01 ENCOUNTER — Other Ambulatory Visit: Payer: Self-pay

## 2023-12-01 DIAGNOSIS — R300 Dysuria: Secondary | ICD-10-CM | POA: Diagnosis present

## 2023-12-01 DIAGNOSIS — I10 Essential (primary) hypertension: Secondary | ICD-10-CM | POA: Insufficient documentation

## 2023-12-01 DIAGNOSIS — G43809 Other migraine, not intractable, without status migrainosus: Secondary | ICD-10-CM | POA: Diagnosis not present

## 2023-12-01 DIAGNOSIS — I2510 Atherosclerotic heart disease of native coronary artery without angina pectoris: Secondary | ICD-10-CM | POA: Insufficient documentation

## 2023-12-01 DIAGNOSIS — N390 Urinary tract infection, site not specified: Secondary | ICD-10-CM | POA: Diagnosis not present

## 2023-12-01 LAB — COMPREHENSIVE METABOLIC PANEL WITH GFR
ALT: 25 U/L (ref 0–44)
AST: 31 U/L (ref 15–41)
Albumin: 3.8 g/dL (ref 3.5–5.0)
Alkaline Phosphatase: 73 U/L (ref 38–126)
Anion gap: 7 (ref 5–15)
BUN: 15 mg/dL (ref 8–23)
CO2: 28 mmol/L (ref 22–32)
Calcium: 8.3 mg/dL — ABNORMAL LOW (ref 8.9–10.3)
Chloride: 105 mmol/L (ref 98–111)
Creatinine, Ser: 0.8 mg/dL (ref 0.44–1.00)
GFR, Estimated: >60 mL/min (ref >60)
Glucose, Bld: 113 mg/dL — ABNORMAL HIGH (ref 70–99)
Potassium: 4.1 mmol/L (ref 3.5–5.1)
Sodium: 140 mmol/L (ref 135–145)
Total Bilirubin: 1 mg/dL (ref 0.0–1.2)
Total Protein: 7 g/dL (ref 6.5–8.1)

## 2023-12-01 LAB — URINALYSIS, W/ REFLEX TO CULTURE (INFECTION SUSPECTED)
Bacteria, UA: NONE SEEN
Bilirubin Urine: NEGATIVE
Glucose, UA: NEGATIVE mg/dL
Hgb urine dipstick: NEGATIVE
Ketones, ur: NEGATIVE mg/dL
Nitrite: NEGATIVE
Protein, ur: NEGATIVE mg/dL
Specific Gravity, Urine: 1.014 (ref 1.005–1.030)
pH: 5 (ref 5.0–8.0)

## 2023-12-01 LAB — CBC WITH DIFFERENTIAL/PLATELET
Abs Immature Granulocytes: 0.04 10*3/uL (ref 0.00–0.07)
Basophils Absolute: 0 10*3/uL (ref 0.0–0.1)
Basophils Relative: 0 %
Eosinophils Absolute: 0.3 10*3/uL (ref 0.0–0.5)
Eosinophils Relative: 3 %
HCT: 41.4 % (ref 36.0–46.0)
Hemoglobin: 13.2 g/dL (ref 12.0–15.0)
Immature Granulocytes: 0 %
Lymphocytes Relative: 11 %
Lymphs Abs: 1.1 10*3/uL (ref 0.7–4.0)
MCH: 30.5 pg (ref 26.0–34.0)
MCHC: 31.9 g/dL (ref 30.0–36.0)
MCV: 95.6 fL (ref 80.0–100.0)
Monocytes Absolute: 1.1 10*3/uL — ABNORMAL HIGH (ref 0.1–1.0)
Monocytes Relative: 11 %
Neutro Abs: 7.5 10*3/uL (ref 1.7–7.7)
Neutrophils Relative %: 75 %
Platelets: 240 10*3/uL (ref 150–400)
RBC: 4.33 MIL/uL (ref 3.87–5.11)
RDW: 13.2 % (ref 11.5–15.5)
WBC: 10 10*3/uL (ref 4.0–10.5)
nRBC: 0 % (ref 0.0–0.2)

## 2023-12-01 LAB — RESP PANEL BY RT-PCR (RSV, FLU A&B, COVID)  RVPGX2
Influenza A by PCR: NEGATIVE
Influenza B by PCR: NEGATIVE
Resp Syncytial Virus by PCR: NEGATIVE
SARS Coronavirus 2 by RT PCR: NEGATIVE

## 2023-12-01 LAB — LACTIC ACID, PLASMA: Lactic Acid, Venous: 1.2 mmol/L (ref 0.5–1.9)

## 2023-12-01 MED ORDER — ONDANSETRON 4 MG PO TBDP
4.0000 mg | ORAL_TABLET | Freq: Once | ORAL | Status: AC
  Administered 2023-12-01: 4 mg via ORAL
  Filled 2023-12-01: qty 1

## 2023-12-01 MED ORDER — CEPHALEXIN 500 MG PO CAPS
500.0000 mg | ORAL_CAPSULE | Freq: Once | ORAL | Status: AC
  Administered 2023-12-01: 500 mg via ORAL
  Filled 2023-12-01: qty 1

## 2023-12-01 MED ORDER — ACETAMINOPHEN 500 MG PO TABS
1000.0000 mg | ORAL_TABLET | Freq: Once | ORAL | Status: AC
  Administered 2023-12-01: 1000 mg via ORAL
  Filled 2023-12-01: qty 2

## 2023-12-01 MED ORDER — ONDANSETRON 4 MG PO TBDP: 4.0000 mg | ORAL_TABLET | Freq: Three times a day (TID) | ORAL | 0 refills | Status: DC | PRN

## 2023-12-01 MED ORDER — KETOROLAC TROMETHAMINE 30 MG/ML IJ SOLN
30.0000 mg | Freq: Once | INTRAMUSCULAR | Status: AC
  Administered 2023-12-01: 30 mg via INTRAMUSCULAR
  Filled 2023-12-01: qty 1

## 2023-12-01 MED ORDER — CEPHALEXIN 500 MG PO CAPS: 500.0000 mg | ORAL_CAPSULE | Freq: Two times a day (BID) | ORAL | 0 refills | Status: AC

## 2023-12-01 NOTE — ED Provider Notes (Signed)
 [facility] Provider Note    Event Date/Time   First MD Initiated Contact with Patient 12/01/23 [id number]     (approximate)   History   Urinary Frequency and Dysuria   HPI  Meredith Russell is a 73 y.o. female past medical history significant for pulmonary hypertension, rheumatoid arthritis, psoriatic arthritis, Raynaud's, migraine history, GERD, coronary artery disease, hypertension, presents to the emergency department with not feeling well.  States that she has been having burning with urination and pressure to her bladder that has been ongoing for the past couple of weeks.  Started an Azo pill whenever she was at [employer].  States that last night her symptoms worsened so she started taking an old antibiotic of ampicillin.  States that she does have a history of migraines and had an episode of migraine yesterday where she had a headache associated with nausea and vomiting.  Took her Fioricet  at home which improved her symptoms.  Currently rates her headache as 4/10.  No change in vision, slurring of speech, extremity numbness or weakness.  States that she has been having some back pain but denies any radiation down the leg.  No numbness or weakness to her lower extremities.  No falls or trauma.  No history of kidney stone.  No flank pain at this time.  Prior hysterectomy and cholecystectomy.  Denies any chest pain or shortness of breath.  Does state that she started coughing and had some productive sputum since yesterday.  States that she has been having problems with allergies.       Physical Exam   Triage Vital Signs: ED Triage Vitals  Encounter Vitals Group     BP 12/01/23 0848 (!) 145/82     Systolic BP Percentile --      Diastolic BP Percentile --      Pulse Rate 12/01/23 0848 (!) 114     Resp 12/01/23 0848 18     Temp 12/01/23 0848 99.6 F (37.6 C)     Temp Source 12/01/23 0848 Oral     SpO2 12/01/23 0848 96 %     Weight 12/01/23 0847 127 lb (57.6 kg)      Height 12/01/23 0847 5\' 3"  (1.6 m)     Head Circumference --      Peak Flow --      Pain Score 12/01/23 0846 9     Pain Loc --      Pain Education --      Exclude from Growth Chart --     Most recent vital signs: Vitals:   12/01/23 0848  BP: (!) 145/82  Pulse: (!) 114  Resp: 18  Temp: 99.6 F (37.6 C)  SpO2: 96%    Physical Exam Constitutional:      Appearance: She is well-developed.  HENT:     Head: Atraumatic.     Nose: Congestion present.  Eyes:     Conjunctiva/sclera: Conjunctivae normal.  Cardiovascular:     Rate and Rhythm: Regular rhythm. Tachycardia present.  Pulmonary:     Effort: No respiratory distress.  Abdominal:     General: There is no distension.     Tenderness: There is abdominal tenderness (Mild suprapubic abdominal tenderness to palpation with no rebound or guarding.  Negative McBurney's point.). There is no right CVA tenderness or left CVA tenderness.  Musculoskeletal:        General: Normal range of motion.     Cervical back: Normal range of motion.  Right lower leg: No edema.     Left lower leg: No edema.  Skin:    General: Skin is warm.     Capillary Refill: Capillary refill takes less than 2 seconds.  Neurological:     General: No focal deficit present.     Mental Status: She is alert. Mental status is at baseline.     GCS: GCS eye subscore is 4. GCS verbal subscore is 5. GCS motor subscore is 6.     Cranial Nerves: Cranial nerves 2-12 are intact.     Sensory: Sensation is intact.     Motor: Motor function is intact.     Comments: No saddle anesthesia.  Sensation intact bilateral upper and lower extremities.  Psychiatric:        Mood and Affect: Mood normal.     IMPRESSION / MDM / ASSESSMENT AND PLAN / ED COURSE  I reviewed the triage vital signs and the nursing notes.  Differential diagnosis including urinary tract infection, pyelonephritis, pneumonia, viral illness including COVID/influenza, migraine headache.  No change in  vision, have low suspicion for giant cell arteritis.  No jaw claudication or temporal artery tenderness.  On arrival temperature 99.6 mildly tachycardic at 114, normotensive and 96% on room air.  No signs of respiratory distress.  Abdomen with no focal abdominal tenderness to palpation and no rebound or guarding.  Not the worst headache of her life, does have a history of migraine headaches, no falls or trauma, low suspicion for subarachnoid hemorrhage, intracranial hemorrhage.   RADIOLOGY I independently reviewed imaging, my interpretation of imaging: Chest x-ray -right shoulder replacement.  No focal findings of pneumonia.  Read as no acute findings.  LABS (all labs ordered are listed, but only abnormal results are displayed) Labs interpreted as -    Labs Reviewed  COMPREHENSIVE METABOLIC PANEL WITH GFR - Abnormal; Notable for the following components:      Result Value   Glucose, Bld 113 (*)    Calcium  8.3 (*)    All other components within normal limits  CBC WITH DIFFERENTIAL/PLATELET - Abnormal; Notable for the following components:   Monocytes Absolute 1.1 (*)    All other components within normal limits  URINALYSIS, W/ REFLEX TO CULTURE (INFECTION SUSPECTED) - Abnormal; Notable for the following components:   Color, Urine YELLOW (*)    APPearance CLEAR (*)    Leukocytes,Ua TRACE (*)    All other components within normal limits  RESP PANEL BY RT-PCR (RSV, FLU A&B, COVID)  RVPGX2  URINE CULTURE  LACTIC ACID, PLASMA  LACTIC ACID, PLASMA     MDM    Urine with questionable findings of urinary tract infection did already take antibiotics at home so could be a's partially treated UTI.  Urine was sent for culture.  On chart review no history of a resistant urinary tract infection.  Given ketorolac  and Tylenol  for pain control.  Given antiemetics and first dose of antibiotic with Keflex in the emergency department.  Patient is otherwise well-appearing with normal lactic  acid at 1.2.  Normal electrolytes.  No leukocytosis.  Hemoglobin is at baseline.  Repeat exam without significant right lower quadrant abdominal tenderness to palpation.,  Have a low suspicion for acute appendicitis and do not feel that CT scan is necessary at this time.  Discussed if she had ongoing symptoms of her pain return or worsen to her right lower abdomen to return to the emergency department because she may need imaging at that time.  No  chest pain or shortness of breath, have a low suspicion for pulmonary embolism.  No longer with tachycardia.  Appears well-hydrated.  On reevaluation had improvement of symptoms.  Offered IV headache medications however patient declined and states that she is feeling much better.  Will start on antibiotics and given antiemetics.  Discussed close follow-up in the next 1 to 2 days with primary care physician for reevaluation.  Discussed return to the emergency department for any worsening symptoms or if she is unable to keep down her antibiotics.  Patient expressed understanding.   PROCEDURES:  Critical Care performed: No  Procedures  Patient's presentation is most consistent with acute presentation with potential threat to life or bodily function.   MEDICATIONS ORDERED IN ED: Medications  ketorolac  (TORADOL ) 30 MG/ML injection 30 mg (30 mg Intramuscular Given 12/01/23 0948)  acetaminophen  (TYLENOL ) tablet 1,000 mg (1,000 mg Oral Given 12/01/23 0948)  ondansetron  (ZOFRAN -ODT) disintegrating tablet 4 mg (4 mg Oral Given 12/01/23 0948)  cephALEXin (KEFLEX) capsule 500 mg (500 mg Oral Given 12/01/23 0948)    FINAL CLINICAL IMPRESSION(S) / ED DIAGNOSES   Final diagnoses:  Lower urinary tract infectious disease  Other migraine without status migrainosus, not intractable     Rx / DC Orders   ED Discharge Orders          Ordered    cephALEXin (KEFLEX) 500 MG capsule  2 times daily        12/01/23 0929    ondansetron  (ZOFRAN -ODT) 4 MG  disintegrating tablet  Every 8 hours PRN        12/01/23 1610             Note:  This document was prepared using Dragon voice recognition software and may include unintentional dictation errors.   Viviano Ground, MD 12/01/23 1023

## 2023-12-01 NOTE — ED Triage Notes (Signed)
 Pt via POV from home. Pt c/o urinary frequency and dysuria that started 2 weeks ago, worse last night. Attempted OTC medication without any relief. Also has had some nasal congestion, and cough that started yesterday. Pt is A&Ox4 and NAD

## 2023-12-01 NOTE — Discharge Instructions (Signed)
 You are seen in the emergency department and diagnosed with a urinary tract infection.  You were given your first dose of antibiotics in the emergency department, take your next dose of antibiotics later this afternoon.  You are also given a prescription for nausea medication.  You can alternate Motrin  and Tylenol  for pain control.  Stay hydrated and drink plenty of fluids.  Your chest x-ray did not show any signs of pneumonia.  Call and follow-up closely with your primary care physician.  Return to the emergency department if you have any ongoing or worsening symptoms.  zofran  (ondansetron ) - nausea medication, take 1 tablet every 8 hours as needed for nausea/vomiting.

## 2023-12-02 LAB — URINE CULTURE: Culture: NO GROWTH

## 2023-12-03 MED ORDER — ZOLPIDEM TARTRATE 10 MG PO TABS: 10.0000 mg | ORAL_TABLET | Freq: Every evening | ORAL | 1 refills | Status: DC | PRN

## 2023-12-03 NOTE — Addendum Note (Signed)
 Addended by: Jeralene Mom E on: 12/03/2023 12:12 PM   Modules accepted: Orders

## 2023-12-03 NOTE — Telephone Encounter (Signed)
 Rx printed and left at my workstation

## 2023-12-08 ENCOUNTER — Ambulatory Visit: Admitting: Family Medicine

## 2023-12-12 ENCOUNTER — Ambulatory Visit (INDEPENDENT_AMBULATORY_CARE_PROVIDER_SITE_OTHER): Admitting: Family Medicine

## 2023-12-12 ENCOUNTER — Encounter: Payer: Self-pay | Admitting: Family Medicine

## 2023-12-12 VITALS — BP 126/75 | HR 79 | Wt 127.2 lb

## 2023-12-12 DIAGNOSIS — N3000 Acute cystitis without hematuria: Secondary | ICD-10-CM | POA: Diagnosis not present

## 2023-12-12 DIAGNOSIS — R102 Pelvic and perineal pain: Secondary | ICD-10-CM | POA: Diagnosis not present

## 2023-12-12 DIAGNOSIS — R3911 Hesitancy of micturition: Secondary | ICD-10-CM | POA: Diagnosis not present

## 2023-12-12 LAB — POCT URINALYSIS DIPSTICK
Bilirubin, UA: NEGATIVE
Blood, UA: NEGATIVE
Glucose, UA: NEGATIVE
Ketones, UA: NEGATIVE
Leukocytes, UA: NEGATIVE
Nitrite, UA: NEGATIVE
Protein, UA: NEGATIVE
Spec Grav, UA: 1.015 (ref 1.010–1.025)
Urobilinogen, UA: 0.2 U/dL
pH, UA: 6.5 (ref 5.0–8.0)

## 2023-12-12 NOTE — Progress Notes (Signed)
 Established patient visit   Patient: Meredith Russell   DOB: 06-06-1951   73 y.o. Female  MRN: [id number] Visit Date: 12/12/2023  Today's healthcare provider: Jeralene Mom, MD   Chief Complaint  Patient presents with   Hospitalization Follow-up    Patient was seen at the ED for UTI. Patient reports still having pressure in lower abdomen/bladder and low back pain.   Subjective    Discussed the use of AI scribe software for clinical note transcription with the patient, who gave verbal consent to proceed.  History of Present Illness   Meredith Russell is a 73 year old female with rheumatoid arthritis who presents with difficulty urinating and pelvic pressure.  She experiences difficulty urinating and a sensation of pelvic pressure, particularly after lunch and before bedtime. The urination difficulty involves sitting for five minutes or more trying to urinate. This issue has not occurred in years and is not related to her fluid intake, as she drinks a lot of water. Two weeks ago, she visited the emergency room and was given antibiotics, an inflammation shot, and a nausea pill. She initially felt better after the antibiotics but continues to experience urinary issues. Her follow-up appointment was rescheduled to today due to the provider's illness.  She reports an increase in the frequency of migraines recently, which was another reason for her emergency room visit. She feels tired, which she considers usual for her. In the review of symptoms, she mentions feeling cold most of the time and recalls having a fever at the hospital. No recent fevers, chills, or sweats.  She has a history of rheumatoid arthritis and back problems, which are currently acting up. She is experiencing significant back pain and was supposed to have back surgery but has not had time due to recent events.       Medications: Outpatient Medications Prior to Visit  Medication Sig   amLODipine  (NORVASC ) 10 MG tablet  TAKE 1 TABLET BY MOUTH DAILY   aspirin  EC 81 MG tablet Take 1 tablet (81 mg total) by mouth daily. Swallow whole.   Azelastine  HCl 137 MCG/SPRAY SOLN PLACE 2 SPRAYS INTO BOTH NOSTRILS 2 (TWO) TIMES DAILY. USE IN EACH NOSTRIL AS DIRECTED   butalbital -acetaminophen -caffeine  (FIORICET ) 50-325-40 MG tablet TAKE 1-2 TABLET BY MOUTH EVERY 6 (SIX) HOURS AS NEEDED FOR HEADACHE.   calcipotriene  (DOVONOX) 0.005 % cream Apply 1 application  topically 3 (three) times daily as needed (psoriasis).   Calcipotriene -Betameth Diprop (WYNZORA ) 0.005-0.064 % CREA Apply to aa's psoriasis BID PRN.   Cholecalciferol  (VITAMIN D3) 1000 units CAPS Take 1,000 Units by mouth daily.    clopidogrel  (PLAVIX ) 75 MG tablet Take 1 tablet (75 mg total) by mouth daily. Loading dose of 300 mg (4 tablets) morning of 05/03/23 then 75 mg (1 tablet) thereafter   cycloSPORINE (RESTASIS) 0.05 % ophthalmic emulsion Place 1 drop into both eyes 2 (two) times daily.   diclofenac Sodium (VOLTAREN) 1 % GEL Apply topically.   fexofenadine  (ALLEGRA ) 180 MG tablet TAKE 1 TABLET BY MOUTH EVERY DAY   fluticasone  (FLONASE ) 50 MCG/ACT nasal spray USE 2 SPRAYS IN BOTH NOSTRILS  DAILY AS NEEDED FOR ALLERGIES   halobetasol  (ULTRAVATE ) 0.05 % ointment Apply 1 application  topically daily as needed (psoriasis). Apply to aa's psoriasis BID PRN. Avoid applying to face, groin, and axilla. Use as directed. Long-term use can cause thinning of the skin.   inFLIXimab -abda (RENFLEXIS ) 100 MG SOLR Inject 100 mg into the vein every 2 (  two) months.   LORazepam  (ATIVAN ) 1 MG tablet TAKE ONE-HALF TO ONE TABLET BY MOUTH TWICE DAILY AS NEEDED FOR ANXIETY   ondansetron  (ZOFRAN -ODT) 4 MG disintegrating tablet Take 1 tablet (4 mg total) by mouth every 8 (eight) hours as needed for nausea or vomiting.   oxyCODONE -acetaminophen  (PERCOCET) 10-325 MG tablet Take 1 tablet by mouth 3 (three) times daily.   rosuvastatin  (CRESTOR ) 10 MG tablet TAKE 1 TABLET BY MOUTH DAILY    tiZANidine  (ZANAFLEX ) 2 MG tablet Take 2 mg by mouth 3 (three) times daily.   vitamin C  (ASCORBIC ACID ) 500 MG tablet Take 500 mg by mouth every other day.   zolpidem  (AMBIEN ) 10 MG tablet Take 1 tablet (10 mg total) by mouth at bedtime as needed for sleep. TAKE ONE TABLET BY MOUTH ONE TIME DAILY AT BEDTIME   mupirocin  ointment (BACTROBAN ) 2 % Apply 1 Application topically daily. (Patient not taking: Reported on 11/04/2023)   nitroGLYCERIN  (NITROSTAT ) 0.4 MG SL tablet Place 1 tablet (0.4 mg total) under the tongue every 5 (five) minutes as needed for chest pain. 1 tablet may be used every 5 minutes as needed, for up to 15 minutes. Do not take more than 3 tablets in 15 minutes. If you do not feel better or feel worse after 1 dose, call 9-1-1 at once.   Povidone (IVIZIA DRY EYES OP) Apply 2 drops to eye 2 (two) times daily. (Patient not taking: Reported on 12/12/2023)   pramoxine-hydrocortisone  (ANALPRAM -HC) 1-1 % rectal cream Place 1 application rectally 2 (two) times daily. (Patient not taking: Reported on 12/12/2023)   promethazine  (PHENERGAN ) 25 MG tablet Take 1 tablet (25 mg total) by mouth every 8 (eight) hours as needed. (Patient not taking: Reported on 12/12/2023)   triamcinolone cream (KENALOG) 0.5 % Apply 1 application topically 2 (two) times daily. As needed (Patient not taking: Reported on 11/04/2023)   vitamin B-12 (CYANOCOBALAMIN ) 1000 MCG tablet Take 1,000 mcg by mouth daily. (Patient not taking: Reported on 11/04/2023)   [DISCONTINUED] benzonatate  (TESSALON  PERLES) 100 MG capsule Take 1 capsule (100 mg total) by mouth 3 (three) times daily as needed for cough. (Patient not taking: Reported on 12/12/2023)   No facility-administered medications prior to visit.   Review of Systems  Constitutional:  Positive for fatigue. Negative for appetite change, chills and fever.  Respiratory:  Negative for chest tightness and shortness of breath.   Cardiovascular:  Negative for chest pain and palpitations.   Gastrointestinal:  Negative for abdominal pain, nausea and vomiting.  Neurological:  Negative for dizziness and weakness.       Objective    BP 126/75 (BP Location: Left Arm, Patient Position: Sitting, Cuff Size: Normal)   Pulse 79   Wt 127 lb 3.2 oz (57.7 kg)   SpO2 99%   BMI 22.53 kg/m   Physical Exam   General appearance: Well developed, well nourished female, cooperative and in no acute distress Head: Normocephalic, without obvious abnormality, atraumatic Respiratory: Respirations even and unlabored, normal respiratory rate Extremities: All extremities are intact.  Skin: Skin color, texture, turgor normal. No rashes seen  Psych: Appropriate mood and affect. Neurologic: Mental status: Alert, oriented to person, place, and time, thought content appropriate.   Results for orders placed or performed in visit on 12/12/23  POCT urinalysis dipstick  Result Value Ref Range   Color, UA Yellow    Clarity, UA clear    Glucose, UA Negative Negative   Bilirubin, UA Negative    Ketones, UA Negative  Spec Grav, UA 1.015 1.010 - 1.025   Blood, UA Negative    pH, UA 6.5 5.0 - 8.0   Protein, UA Negative Negative   Urobilinogen, UA 0.2 0.2 or 1.0 E.U./dL   Nitrite, UA Negative    Leukocytes, UA Negative Negative   Appearance     Odor       Assessment & Plan     Follow up UTI/ ER visit U/a clear today    Urinary retention Persistent urinary retention without infection. Suspected bladder or surrounding organ issue. - Pelvic ultrasound - Instructed to contact office if issues arise before ultrasound.  Migraine Increased migraine frequency. Partial relief with emergency room antiemetic treatment.  Back pain Increased back pain due to known back issues. Pending surgery not scheduled due to other health concerns.    No follow-ups on file.     Jeralene Mom, MD  [facility] [phone] (phone) [phone] (fax)  [facility]

## 2023-12-23 ENCOUNTER — Ambulatory Visit
Admission: RE | Admit: 2023-12-23 | Discharge: 2023-12-23 | Disposition: A | Discharge status: Home / Self Care | Source: Ambulatory Visit | Attending: Family Medicine | Admitting: Family Medicine

## 2023-12-23 ENCOUNTER — Ambulatory Visit: Payer: Self-pay | Admitting: Family Medicine

## 2023-12-23 DIAGNOSIS — R3911 Hesitancy of micturition: Secondary | ICD-10-CM | POA: Insufficient documentation

## 2023-12-23 DIAGNOSIS — R300 Dysuria: Secondary | ICD-10-CM

## 2023-12-23 DIAGNOSIS — M545 Low back pain, unspecified: Secondary | ICD-10-CM

## 2023-12-23 DIAGNOSIS — G8929 Other chronic pain: Secondary | ICD-10-CM

## 2023-12-25 NOTE — Progress Notes (Signed)
 Spoke with pt made aware and verbalized understanding.

## 2023-12-29 ENCOUNTER — Encounter: Payer: Self-pay | Admitting: Family Medicine

## 2023-12-31 NOTE — Telephone Encounter (Unsigned)
 Copied from CRM [id number]. Topic: General - Other >> Dec 31, 2023 11:24 AM Lauraine BROCKS wrote: Reason for CRM: Per Bernice at [facility] order for MRI will need to be corrected to W/WO contrast

## 2024-01-12 ENCOUNTER — Ambulatory Visit
Admission: RE | Admit: 2024-01-12 | Discharge: 2024-01-12 | Disposition: A | Discharge status: Home / Self Care | Source: Ambulatory Visit | Attending: Family Medicine | Admitting: Family Medicine

## 2024-01-12 DIAGNOSIS — R3911 Hesitancy of micturition: Secondary | ICD-10-CM

## 2024-01-12 DIAGNOSIS — R300 Dysuria: Secondary | ICD-10-CM

## 2024-01-12 DIAGNOSIS — G8929 Other chronic pain: Secondary | ICD-10-CM

## 2024-01-12 MED ORDER — GADOPICLENOL 0.5 MMOL/ML IV SOLN
6.0000 mL | Freq: Once | INTRAVENOUS | Status: AC | PRN
  Administered 2024-01-12: 6 mL via INTRAVENOUS

## 2024-01-13 ENCOUNTER — Encounter: Payer: Self-pay | Admitting: Family Medicine

## 2024-01-15 ENCOUNTER — Ambulatory Visit: Payer: Self-pay | Admitting: Family Medicine

## 2024-01-15 DIAGNOSIS — R3911 Hesitancy of micturition: Secondary | ICD-10-CM

## 2024-02-11 ENCOUNTER — Ambulatory Visit (INDEPENDENT_AMBULATORY_CARE_PROVIDER_SITE_OTHER): Admitting: Urology

## 2024-02-11 ENCOUNTER — Encounter: Payer: Self-pay | Admitting: Urology

## 2024-02-11 VITALS — BP 150/82 | HR 76 | Ht 63.0 in | Wt 127.0 lb

## 2024-02-11 DIAGNOSIS — R3911 Hesitancy of micturition: Secondary | ICD-10-CM

## 2024-02-11 LAB — URINALYSIS, COMPLETE
Bilirubin, UA: NEGATIVE
Glucose, UA: NEGATIVE
Ketones, UA: NEGATIVE
Leukocytes,UA: NEGATIVE
Nitrite, UA: NEGATIVE
Protein,UA: NEGATIVE
RBC, UA: NEGATIVE
Specific Gravity, UA: <1.005 — ABNORMAL LOW (ref 1.005–1.030)
Urobilinogen, Ur: 0.2 mg/dL (ref 0.2–1.0)
pH, UA: 6 (ref 5.0–7.5)

## 2024-02-11 LAB — MICROSCOPIC EXAMINATION: RBC, Urine: NONE SEEN /HPF (ref 0–2)

## 2024-02-11 NOTE — Progress Notes (Signed)
 02/11/2024 9:21 AM   [name] 11-24-1950 [id number]  Referring provider: Gasper Nancyann BRAVO, MD [address] [place],  [place] [address]  Chief Complaint  Patient presents with   Establish Care    Urinary hesitancy    HPI: Meredith Russell is a 73 y.o. female referred for evaluation of urinary hesitancy.  ED visit 12/01/2023 complaining of urinary frequency, dysuria and bladder pressure Urinalysis showed 11-20 WBCs and she was treated with cephalexin  with resolution of symptoms Urine culture was subsequently negative PCP follow-up 12/12/2023 complaining of urinary hesitancy, sensation of incomplete emptying and bladder pressure.  A pelvic ultrasound which showed no significant abnormalities and PVR was 19 mL She is presently asymptomatic and states she has significant back problems and her symptoms typically occur when she significantly increases her activity  PMH: Past Medical History:  Diagnosis Date   Dysplastic nevus 06/11/2022   left posterior thigh, shave removal 08/06/2022 margins clear   Hemorrhoids    Pneumonia due to COVID-19 virus    Psoriasis    Rheumatoid arthritis (HCC)    SCC (squamous cell carcinoma) 10/08/2022   mid chest right of mid line, [facility]    Surgical History: Past Surgical History:  Procedure Laterality Date   ABDOMINAL HYSTERECTOMY     APPENDECTOMY     BACK SURGERY  2005, 2006   Lumbar and cervical fusions   BREAST EXCISIONAL BIOPSY Right 90s   neg   BUNIONECTOMY     CARPAL TUNNEL RELEASE Bilateral 09/2013   CATARACT EXTRACTION, BILATERAL     COLONOSCOPY     CORONARY PRESSURE/FFR STUDY N/A 03/11/2023   Procedure: CORONARY PRESSURE/FFR STUDY;  Surgeon: Mady Bruckner, MD;  Location: [facility] INVASIVE CV LAB;  Service: Cardiovascular;  Laterality: N/A;   CORONARY STENT INTERVENTION N/A 03/11/2023   Procedure: CORONARY STENT INTERVENTION;  Surgeon: Mady Bruckner, MD;  Location: [facility] INVASIVE CV LAB;  Service: Cardiovascular;   Laterality: N/A;   EYE SURGERY     HEMORRHOID SURGERY N/A 09/17/2018   Procedure: HEMORRHOIDECTOMY SKIN TAG REMOVAL;  Surgeon: Tye Millet, DO;  Location: [facility] ORS;  Service: General;  Laterality: N/A;   HEMORROIDECTOMY     JOINT REPLACEMENT     LEFT HEART CATH AND CORONARY ANGIOGRAPHY N/A 03/11/2023   Procedure: LEFT HEART CATH AND CORONARY ANGIOGRAPHY;  Surgeon: Mady Bruckner, MD;  Location: [facility] INVASIVE CV LAB;  Service: Cardiovascular;  Laterality: N/A;   OOPHORECTOMY     one ovary removed   REPLACEMENT TOTAL KNEE Bilateral 2009   TOTAL SHOULDER REPLACEMENT Right 2010    Home Medications:  Allergies as of 02/11/2024       Reactions   Adalimumab Itching, Rash   Psoriasis   Morphine Other (See Comments)   Tachycardia   Morphine And Codeine Other (See Comments)   Tachycardia   Pregabalin Other (See Comments)   Increased BP    Savella  [milnacipran] Rash   Sulfa Antibiotics Nausea And Vomiting        Medication List        Accurate as of February 11, 2024  9:21 AM. If you have any questions, ask your nurse or doctor.          STOP taking these medications    Renflexis  100 MG Solr Generic drug: inFLIXimab -abda Stopped by: Glendia JAYSON Russell       TAKE these medications    amLODipine  10 MG tablet Commonly known as: NORVASC  TAKE 1 TABLET BY MOUTH DAILY   ascorbic  acid 500 MG tablet Commonly known as: VITAMIN C  Take 500 mg by mouth every other day.   aspirin  EC 81 MG tablet Take 1 tablet (81 mg total) by mouth daily. Swallow whole.   Azelastine  HCl 137 MCG/SPRAY Soln PLACE 2 SPRAYS INTO BOTH NOSTRILS 2 (TWO) TIMES DAILY. USE IN EACH NOSTRIL AS DIRECTED   Bimzelx 160 MG/ML pen Generic drug: bimekizumab-bkzx   butalbital -acetaminophen -caffeine  50-325-40 MG tablet Commonly known as: FIORICET  TAKE 1-2 TABLET BY MOUTH EVERY 6 (SIX) HOURS AS NEEDED FOR HEADACHE.   calcipotriene  0.005 % cream Commonly known as: DOVONOX Apply 1 application  topically 3  (three) times daily as needed (psoriasis).   clopidogrel  75 MG tablet Commonly known as: PLAVIX  Take 1 tablet (75 mg total) by mouth daily. Loading dose of 300 mg (4 tablets) morning of 05/03/23 then 75 mg (1 tablet) thereafter   cyanocobalamin  1000 MCG tablet Commonly known as: VITAMIN B12 Take 1,000 mcg by mouth daily.   cycloSPORINE 0.05 % ophthalmic emulsion Commonly known as: RESTASIS Place 1 drop into both eyes 2 (two) times daily.   diclofenac Sodium 1 % Gel Commonly known as: VOLTAREN Apply topically.   fexofenadine  180 MG tablet Commonly known as: ALLEGRA  TAKE 1 TABLET BY MOUTH EVERY DAY   fluticasone  50 MCG/ACT nasal spray Commonly known as: FLONASE  USE 2 SPRAYS IN BOTH NOSTRILS  DAILY AS NEEDED FOR ALLERGIES   halobetasol  0.05 % ointment Commonly known as: ULTRAVATE  Apply 1 application  topically daily as needed (psoriasis). Apply to aa's psoriasis BID PRN. Avoid applying to face, groin, and axilla. Use as directed. Long-term use can cause thinning of the skin.   IVIZIA DRY EYES OP Apply 2 drops to eye 2 (two) times daily.   leflunomide 20 MG tablet Commonly known as: ARAVA Take 20 mg by mouth.   LORazepam  1 MG tablet Commonly known as: ATIVAN  TAKE ONE-HALF TO ONE TABLET BY MOUTH TWICE DAILY AS NEEDED FOR ANXIETY   mupirocin  ointment 2 % Commonly known as: BACTROBAN  Apply 1 Application topically daily.   nitroGLYCERIN  0.4 MG SL tablet Commonly known as: NITROSTAT  Place 1 tablet (0.4 mg total) under the tongue every 5 (five) minutes as needed for chest pain. 1 tablet may be used every 5 minutes as needed, for up to 15 minutes. Do not take more than 3 tablets in 15 minutes. If you do not feel better or feel worse after 1 dose, call 9-1-1 at once.   ondansetron  4 MG disintegrating tablet Commonly known as: ZOFRAN -ODT Take 1 tablet (4 mg total) by mouth every 8 (eight) hours as needed for nausea or vomiting.   oxyCODONE -acetaminophen  10-325 MG  tablet Commonly known as: PERCOCET Take 1 tablet by mouth 3 (three) times daily.   pramoxine-hydrocortisone  1-1 % rectal cream Commonly known as: Analpram -HC Place 1 application rectally 2 (two) times daily.   predniSONE  10 MG tablet Commonly known as: DELTASONE  Take by mouth.   promethazine  25 MG tablet Commonly known as: PHENERGAN  Take 1 tablet (25 mg total) by mouth every 8 (eight) hours as needed.   rosuvastatin  10 MG tablet Commonly known as: CRESTOR  TAKE 1 TABLET BY MOUTH DAILY   tiZANidine  2 MG tablet Commonly known as: ZANAFLEX  Take 2 mg by mouth 3 (three) times daily.   triamcinolone cream 0.5 % Commonly known as: KENALOG Apply 1 application topically 2 (two) times daily. As needed   Vitamin D3 25 MCG (1000 UT) Caps Take 1,000 Units by mouth daily.   Wynzora  0.005-0.064 %  Crea Generic drug: Calcipotriene -Betameth Diprop Apply to aa's psoriasis BID PRN.   zolpidem  10 MG tablet Commonly known as: AMBIEN  Take 1 tablet (10 mg total) by mouth at bedtime as needed for sleep. TAKE ONE TABLET BY MOUTH ONE TIME DAILY AT BEDTIME        Allergies:  Allergies  Allergen Reactions   Adalimumab Itching and Rash    Psoriasis   Morphine Other (See Comments)    Tachycardia   Morphine And Codeine Other (See Comments)    Tachycardia   Pregabalin Other (See Comments)    Increased BP    Savella  [Milnacipran] Rash   Sulfa Antibiotics Nausea And Vomiting    Family History: Family History  Problem Relation Age of Onset   Heart disease Mother    Heart attack Mother    Heart disease Father    Cancer Father    Diabetes Other    Heart disease Other    Hyperlipidemia Other    Hypertension Other    Dementia Daughter    Bladder Cancer Neg Hx    Kidney cancer Neg Hx    Colon cancer Neg Hx    Breast cancer Neg Hx     Social History:  reports that she quit smoking about 36 years ago. Her smoking use included cigarettes. She has never used smokeless tobacco. She  reports current alcohol use. She reports that she does not use drugs.   Physical Exam: BP (!) 150/82   Pulse 76   Ht 5' 3 (1.6 m)   Wt 127 lb (57.6 kg)   BMI 22.50 kg/m   Constitutional:  Alert and oriented, No acute distress. HEENT: [place] AT Respiratory: Normal respiratory effort, no increased work of breathing. Psychiatric: Normal mood and affect.  Laboratory Data:  Urinalysis Dipstick/microscopy negative   Assessment & Plan:    1. Urinary hesitancy (Primary) PVR today 0 mL Presently asymptomatic Follow-up recommended for recheck prn recurrent symptoms   Glendia JAYSON Barba, MD  [facility] [address], [address] [place], [place] [address] [phone]

## 2024-02-16 ENCOUNTER — Other Ambulatory Visit: Payer: Self-pay | Admitting: Internal Medicine

## 2024-03-08 ENCOUNTER — Other Ambulatory Visit: Payer: Self-pay | Admitting: Internal Medicine

## 2024-03-26 ENCOUNTER — Telehealth: Payer: Self-pay

## 2024-03-26 NOTE — Telephone Encounter (Signed)
   Name: Meredith Russell  DOB: 1950/08/10  MRN: [id number]  Primary Cardiologist: Lonni Hanson, MD  Chart reviewed as part of pre-operative protocol coverage. Because of [name]'s past medical history and time since last visit, she will require a follow-up in-office visit in order to better assess preoperative cardiovascular risk.Pt is due for follow-up per office note from 10/2023.   Pre-op covering staff: - Please schedule appointment and call patient to inform them. If patient already had an upcoming appointment within acceptable timeframe, please add pre-op clearance to the appointment notes so provider is aware. - Please contact requesting surgeon's office via preferred method (i.e, phone, fax) to inform them of need for appointment prior to surgery.  Damien JAYSON Braver, NP  03/26/2024, 2:40 PM

## 2024-03-26 NOTE — Telephone Encounter (Signed)
 Preop OV scheduled in [place] per patient request

## 2024-03-26 NOTE — Telephone Encounter (Signed)
   Pre-operative Risk Assessment    Patient Name: Meredith Russell  DOB: 1950/12/01 MRN: [id number]   Date of last office visit: 11/04/23 Date of next office visit: Not scheduled   Request for Surgical Clearance    Procedure:  left wrist scaphoid excision and 4 corner fusion with repair reconstruction as necessary including PIN neurectomy and EPL transfer and repair as necessary  Date of Surgery:  Clearance TBD                                Surgeon:  Dr. Elsie Mussel Surgeon's Group or Practice Name:  [facility] Phone number:  [phone] Fax number:  [phone]   Type of Clearance Requested:   - Medical  - Pharmacy:  Hold Clopidogrel  (Plavix )     Type of Anesthesia:  Not Indicated   Additional requests/questions:    [name]   03/26/2024, 12:30 PM

## 2024-03-28 NOTE — Progress Notes (Unsigned)
 Cardiology Clinic Note   Date: 03/30/2024 ID: Russell, Meredith [birth year], MRN [id number]  Primary Cardiologist:  Lonni Hanson, MD  Chief Complaint   Meredith Russell is a 73 y.o. female who presents to the clinic today for pre-op evaluation.   Patient Profile   Meredith Russell is followed by Dr. Hanson for the history outlined below.      Past medical history significant for: CAD. Echo 03/08/2023: EF 60 to 65%.  No RWMA.  Normal diastolic parameters.  Normal global strain.  Normal RV size/function.  Normal PA pressure.  Trivial MR.  Mild aortic valve calcification/thickening without stenosis. LHC 03/11/2023 (NSTEMI): Severe multivessel CAD including 95% mid LAD stenosis with CTO of small D1 and OM 2.  Moderate multifocal LCx/OM disease, 70% mid LCx not hemodynamically significant by flow assessment.  RCA with mild to moderate nonobstructive disease.  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  PCI with DES 2.75 x 18 mm to mid LAD. Nuclear stress test 08/11/2023: Normal, low risk study.  No evidence of ischemia or infarction.  CT attenuation images showed moderate aortic and coronary calcifications. Hypertension. Hyperlipidemia. Lipid panel 04/22/2023: LDL 51, HDL 126, TG 75, total 192. RA. Psoriatic arthritis Raynaud's. Migraines. GERD. Former tobacco abuse.  In summary, patient presented to the ED on 03/07/2023 after being evaluated by [facility] urgent care for chest pain that had been intermittent for 4 months.  She described substernal chest pain as heaviness and tightness radiating up her neck and jaw with exertion that resolved after 1 to 2 minutes of rest associated with mild nausea.  She had no prior history of cardiovascular disease but with a longstanding history of RA and psoriatic arthritis with Raynaud's disease previously followed at [facility].  She reported a former smoking history and strong family history of early onset CAD.  Given her multiple  medication changes for RA she was unsure if discomfort was related to arthritis.  Echo demonstrated normal LV/RV function.  She underwent heart catheterization revealing severe multivessel CAD with subsequent PCI to mid LAD.  Medical therapy was favored for LCx/OM/D1 disease.  In October 2024 she complained of nausea she related to [place].  Decision was made to change her to Plavix . In January 2025 she had been treated for CAP and continued to complain of persistent cough.  She described fleeting chest discomfort and underwent nuclear stress test which was a normal low risk study.    Patient was last seen in the office by Tylene Lunch, NP, 11/04/2023 for routine follow-up.  She was stable from a cardiac standpoint.  She inquired about discontinuing DAPT at her 1-year mark secondary to easy bruising.  Decision was deferred to follow-up.  BP was well-controlled at that time.     History of Present Illness    Today, patient is doing well. Patient denies shortness of breath, dyspnea on exertion, orthopnea or PND. She reports in the last 2  months she has noted edema in lower extremities starting at mid tibial area to toes with reported discomfort of her skin and itching. She has a history of RA and psoriatic arthritis and the rheumatologist wanted her to mention it to cardiology. She reports no edema present in the morning but by the evening she can't see the bones in my lower legs. She tried wearing compression socks but they were very painful for her and did not seem to help. No chest pain, pressure, or tightness.  No palpitations.  She is very active. She walks and does upper and lower extremity exercises as tolerated. She performs light to heavy household activities. She is currently helping renovate a house. She is pending left wrist surgery with Dr. Camella.       ROS: All other systems reviewed and are otherwise negative except as noted in History of Present Illness.  EKGs/Labs Reviewed    EKG  Interpretation Date/Time:  Tuesday March 30 2024 08:26:34 EDT Ventricular Rate:  65 PR Interval:  140 QRS Duration:  70 QT Interval:  402 QTC Calculation: 418 R Axis:   -8  Text Interpretation: Normal sinus rhythm Normal ECG When compared with ECG of 04-Nov-2023 09:16, No significant change was found Confirmed by Loistine Sober [phone]) on 03/30/2024 8:27:49 AM   12/01/2023: ALT 25; AST 31; BUN 15; Creatinine, Ser 0.80; Potassium 4.1; Sodium 140   12/01/2023: Hemoglobin 13.2; WBC 10.0    Physical Exam    VS:  BP 116/60   Pulse 65   Ht 5' 3 (1.6 m)   Wt 126 lb 12.8 oz (57.5 kg)   SpO2 97%   BMI 22.46 kg/m  , BMI Body mass index is 22.46 kg/m.  GEN: Well nourished, well developed, in no acute distress. Neck: No JVD or carotid bruits. Cardiac:  RRR.  No murmur. No rubs or gallops.   Respiratory:  Respirations regular and unlabored. Clear to auscultation without rales, wheezing or rhonchi. GI: Soft, nontender, nondistended. Extremities: Radials/DP/PT 2+ and equal bilaterally. No clubbing or cyanosis. No edema.  Skin: Warm and dry, no rash. Neuro: Strength intact.  Assessment & Plan   CAD S/p PCI with DES to mid LAD September 2024.  Nuclear stress test February 2025 within normal, low risk study with no evidence of ischemia or infarction.  Patient denies chest pain, pressure or tightness. She is very active at home and walks for exercise. Discussed DAPT with Dr. Mady. He is in agreement to stop Plavix  and continue aspirin  as monotherapy.  - Continue amlodipine , rosuvastatin , as needed SL NTG. - Stop Plavix  and continue aspirin  81 mg daily.   Lower extremity edema Patient reports a 2 month history of bilateral lower extremity edema starting in mid tibial area and extending down to toes. There is no edema present in the morning but by the end of the day she can't see the bones in my legs. She restricts her sodium intake. She tried compression socks but could not tolerate  them secondary to her skin hurting. She has a history of RA and psoriatic arthritis and is unsure if the edema is related to that. Had a long discussion about adding a diuretic. She is in agreement with deferring the addition. No edema on exam today. Euvolemic and well compensated on exam.  - Contact the office if lower extremity edema persists or worsens.   Hypertension BP today 116/60. - Continue amlodipine .  Hyperlipidemia LDL 51 October 2024, at goal. - Continue rosuvastatin .  Preoperative cardiovascular risk assessment Left wrist scaphoid excision and 4 corner fusion with repair or reconstruction as necessary including PIN neurectomy and EPL transfer and repair as necessary by Dr. Camella.  According to the RCRI, patient has a 0.9% risk of MACE. Patient reports activity equivalent to >4.0 METS (walking, light to heavy household activites).  -Based on ACC/AHA guidelines, JREAM BROYLES would be at acceptable risk for the planned procedure without further cardiovascular testing.  - Changes made to Plavix  today as above.  Patient should continue  aspirin  81 mg daily throughout peri-procedural period.  Disposition: Stop Plavix . Continue aspirin  81 mg daily. Return in 6 months or sooner as needed.          Signed, Barnie HERO. Tamsyn Owusu, DNP, NP-C

## 2024-03-30 ENCOUNTER — Encounter: Payer: Self-pay | Admitting: Student

## 2024-03-30 ENCOUNTER — Ambulatory Visit: Attending: Student | Admitting: Student

## 2024-03-30 VITALS — BP 116/60 | HR 65 | Ht 63.0 in | Wt 126.8 lb

## 2024-03-30 DIAGNOSIS — Z01810 Encounter for preprocedural cardiovascular examination: Secondary | ICD-10-CM

## 2024-03-30 DIAGNOSIS — I2510 Atherosclerotic heart disease of native coronary artery without angina pectoris: Secondary | ICD-10-CM | POA: Diagnosis not present

## 2024-03-30 DIAGNOSIS — I10 Essential (primary) hypertension: Secondary | ICD-10-CM | POA: Diagnosis not present

## 2024-03-30 DIAGNOSIS — E785 Hyperlipidemia, unspecified: Secondary | ICD-10-CM

## 2024-03-30 NOTE — Patient Instructions (Signed)
 Medication Instructions:  Your physician recommends the following medication changes.  STOP TAKING: Plavix   *If you need a refill on your cardiac medications before your next appointment, please call your pharmacy*  Lab Work: None ordered at this time  If you have labs (blood work) drawn today and your tests are completely normal, you will receive your results only by: MyChart Message (if you have MyChart) OR A paper copy in the mail If you have any lab test that is abnormal or we need to change your treatment, we will call you to review the results.  Testing/Procedures: None ordered at this time   Follow-Up: At [facility], you and your health needs are our priority.  As part of our continuing mission to provide you with exceptional heart care, our providers are all part of one team.  This team includes your primary Cardiologist (physician) and Advanced Practice Providers or APPs (Physician Assistants and Nurse Practitioners) who all work together to provide you with the care you need, when you need it.  Your next appointment:   6 month(s)  Provider:   Lonni Hanson, MD or Barnie Hila, NP    We recommend signing up for the patient portal called MyChart.  Sign up information is provided on this After Visit Summary.  MyChart is used to connect with patients for Virtual Visits (Telemedicine).  Patients are able to view lab/test results, encounter notes, upcoming appointments, etc.  Non-urgent messages can be sent to your provider as well.   To learn more about what you can do with MyChart, go to [email or web address].

## 2024-03-31 ENCOUNTER — Ambulatory Visit (INDEPENDENT_AMBULATORY_CARE_PROVIDER_SITE_OTHER): Payer: Self-pay | Admitting: Family Medicine

## 2024-03-31 ENCOUNTER — Encounter: Payer: Self-pay | Admitting: Family Medicine

## 2024-03-31 VITALS — BP 125/73 | HR 78 | Resp 14 | Ht 63.0 in | Wt 125.5 lb

## 2024-03-31 DIAGNOSIS — M069 Rheumatoid arthritis, unspecified: Secondary | ICD-10-CM

## 2024-03-31 DIAGNOSIS — I679 Cerebrovascular disease, unspecified: Secondary | ICD-10-CM

## 2024-03-31 DIAGNOSIS — R739 Hyperglycemia, unspecified: Secondary | ICD-10-CM

## 2024-03-31 DIAGNOSIS — R208 Other disturbances of skin sensation: Secondary | ICD-10-CM

## 2024-03-31 DIAGNOSIS — M8580 Other specified disorders of bone density and structure, unspecified site: Secondary | ICD-10-CM

## 2024-03-31 DIAGNOSIS — G4700 Insomnia, unspecified: Secondary | ICD-10-CM

## 2024-03-31 DIAGNOSIS — I10 Essential (primary) hypertension: Secondary | ICD-10-CM | POA: Diagnosis not present

## 2024-03-31 DIAGNOSIS — Z0001 Encounter for general adult medical examination with abnormal findings: Secondary | ICD-10-CM

## 2024-03-31 DIAGNOSIS — Z0000 Encounter for general adult medical examination without abnormal findings: Secondary | ICD-10-CM

## 2024-03-31 DIAGNOSIS — I25118 Atherosclerotic heart disease of native coronary artery with other forms of angina pectoris: Secondary | ICD-10-CM | POA: Diagnosis not present

## 2024-03-31 DIAGNOSIS — R609 Edema, unspecified: Secondary | ICD-10-CM

## 2024-03-31 DIAGNOSIS — E2839 Other primary ovarian failure: Secondary | ICD-10-CM

## 2024-03-31 MED ORDER — ZOLPIDEM TARTRATE 10 MG PO TABS: 10.0000 mg | ORAL_TABLET | Freq: Every evening | ORAL | 1 refills | Status: DC | PRN

## 2024-03-31 NOTE — Progress Notes (Signed)
 Complete physical exam   Patient: Meredith Russell   DOB: 1951-03-16   73 y.o. Female  MRN: [id number] Visit Date: 03/31/2024  Today's healthcare provider: Nancyann Perry, MD   Chief Complaint  Patient presents with   Annual Exam    Sleep pattern: per pt awful, if no medication only gets 3 hours and with medication about 6 hours of sleep Exercise: daily at least working on legs and arms No concerns   Subjective    Discussed the use of AI scribe software for clinical note transcription with the patient, who gave verbal consent to proceed.  History of Present Illness   Meredith Russell is a 73 year old female who presents for a routine physical exam.  She has been experiencing swelling and itching in both legs, particularly around the ankles, for one to two months. The skin is tender and painful, with knots on both sides of the legs. The patient reports that the swelling in her legs is mostly in the afternoon.  Her medical history includes hypertension and hyperlipidemia, managed with amlodipine  and rosuvastatin . Amlodipine  was increased last year following heart problems. She regularly monitors her blood pressure at home, noting it is usually lower than readings taken at medical appointments. She was recently taken off Plavix .  She has rheumatoid arthritis, which significantly affects her body, necessitating multiple surgeries on her hands and back. She also has a history of colitis, which is currently less active. She experienced double pneumonia following COVID-19 in 2021, resulting in scar tissue on her lungs, and she continues to follow up with a lung specialist.  She has ongoing issues with sleep, for which she occasionally takes Ambien , and tries to avoid taking it every night to prevent dependency. She also uses a muscle relaxer to help with sleep when needed.  Her medication regimen includes a daily vitamin D  supplement of 1000 units. She has not had a bone density test in a  while, though she is due for one.  No changes in her medications except for the recent discontinuation of Plavix . No new hearing issues but some ringing in one ear, possibly related to rheumatoid arthritis. Stomach issues related to colitis are not as severe as before.        HPI     Annual Exam    Additional comments: Sleep pattern: per pt awful, if no medication only gets 3 hours and with medication about 6 hours of sleep Exercise: daily at least working on legs and arms No concerns      Last edited by Wilfred Hargis RAMAN, CMA on 03/31/2024  8:20 AM.       Past Medical History:  Diagnosis Date   Dysplastic nevus 06/11/2022   left posterior thigh, shave removal 08/06/2022 margins clear   Hemorrhoids    Pneumonia due to COVID-19 virus    Psoriasis    Rheumatoid arthritis (HCC)    SCC (squamous cell carcinoma) 10/08/2022   mid chest right of mid line, [facility]   Past Surgical History:  Procedure Laterality Date   ABDOMINAL HYSTERECTOMY     APPENDECTOMY     BACK SURGERY  2005, 2006   Lumbar and cervical fusions   BREAST EXCISIONAL BIOPSY Right 90s   neg   BUNIONECTOMY     CARPAL TUNNEL RELEASE Bilateral 09/2013   CATARACT EXTRACTION, BILATERAL     COLONOSCOPY     CORONARY PRESSURE/FFR STUDY N/A 03/11/2023   Procedure: CORONARY PRESSURE/FFR STUDY;  Surgeon: Mady Bruckner,  MD;  Location: [facility] INVASIVE CV LAB;  Service: Cardiovascular;  Laterality: N/A;   CORONARY STENT INTERVENTION N/A 03/11/2023   Procedure: CORONARY STENT INTERVENTION;  Surgeon: Mady Bruckner, MD;  Location: [facility] INVASIVE CV LAB;  Service: Cardiovascular;  Laterality: N/A;   EYE SURGERY     HEMORRHOID SURGERY N/A 09/17/2018   Procedure: HEMORRHOIDECTOMY SKIN TAG REMOVAL;  Surgeon: Tye Millet, DO;  Location: [facility] ORS;  Service: General;  Laterality: N/A;   HEMORROIDECTOMY     JOINT REPLACEMENT     LEFT HEART CATH AND CORONARY ANGIOGRAPHY N/A 03/11/2023   Procedure: LEFT HEART CATH AND CORONARY ANGIOGRAPHY;   Surgeon: Mady Bruckner, MD;  Location: [facility] INVASIVE CV LAB;  Service: Cardiovascular;  Laterality: N/A;   OOPHORECTOMY     one ovary removed   REPLACEMENT TOTAL KNEE Bilateral 2009   TOTAL SHOULDER REPLACEMENT Right 2010   Social History   Socioeconomic History   Marital status: Married    Spouse name: Alm   Number of children: 2   Years of education: Not on file   Highest education level: 12th grade  Occupational History   Occupation: retired  Tobacco Use   Smoking status: Former    Current packs/day: 0.00    Types: Cigarettes    Quit date: 11/22/1987    Years since quitting: 36.3   Smokeless tobacco: Never  Vaping Use   Vaping status: Never Used  Substance and Sexual Activity   Alcohol use: Yes    Comment: occasional; drinks vodka and whiskey   Drug use: No   Sexual activity: Not on file  Other Topics Concern   Not on file  Social History Narrative   Not on file   Social Drivers of Health   Financial Resource Strain: Patient Declined (03/29/2024)   Overall Financial Resource Strain (CARDIA)    Difficulty of Paying Living Expenses: Patient declined  Food Insecurity: Patient Declined (12/05/2023)   Hunger Vital Sign    Worried About Running Out of Food in the Last Year: Patient declined    Ran Out of Food in the Last Year: Patient declined  Transportation Needs: Patient Declined (03/29/2024)   PRAPARE - Administrator, Civil Service (Medical): Patient declined    Lack of Transportation (Non-Medical): Patient declined  Physical Activity: Insufficiently Active (03/29/2024)   Exercise Vital Sign    Days of Exercise per Week: 2 days    Minutes of Exercise per Session: 10 min  Stress: Patient Declined (03/29/2024)   [employer] of Occupational Health - Occupational Stress Questionnaire    Feeling of Stress: Patient declined  Social Connections: Moderately Integrated (03/29/2024)   Social Connection and Isolation Panel    Frequency of Communication  with Friends and Family: More than three times a week    Frequency of Social Gatherings with Friends and Family: Once a week    Attends Religious Services: More than 4 times per year    Active Member of [employer] or Organizations: No    Attends Banker Meetings: Not on file    Marital Status: Married  Catering Manager Violence: Not At Risk (03/07/2023)   Humiliation, Afraid, Rape, and Kick questionnaire    Fear of Current or Ex-Partner: No    Emotionally Abused: No    Physically Abused: No    Sexually Abused: No   Family Status  Relation Name Status   Mother  Deceased   Father  Deceased   Other  (Not Specified)   Daughter  Alive  Neg Hx  (Not Specified)  No partnership data on file   Family History  Problem Relation Age of Onset   Heart disease Mother    Heart attack Mother    Heart disease Father    Cancer Father    Diabetes Other    Heart disease Other    Hyperlipidemia Other    Hypertension Other    Dementia Daughter    Bladder Cancer Neg Hx    Kidney cancer Neg Hx    Colon cancer Neg Hx    Breast cancer Neg Hx    Allergies  Allergen Reactions   Adalimumab Itching, Rash and Dermatitis    Psoriasis   Morphine Other (See Comments)    Tachycardia  Other Reaction(s): other  morphine   Morphine And Codeine Other (See Comments)    Tachycardia   Pregabalin Other (See Comments)    Increased BP  Other Reaction(s): other  pregabalin   Milnacipran Rash and Dermatitis   Sulfa Antibiotics Nausea And Vomiting    Patient Care Team: Gasper Nancyann BRAVO, MD as PCP - General (Family Medicine) End, Lonni, MD as PCP - Cardiology (Cardiology) [facility] [place] , MD (Inactive) as Consulting Physician (Dermatology) Avanell Katz, MD as Referring Physician (Physical Medicine and Rehabilitation) [place], Teodoro K, MD as Consulting Physician (Gastroenterology) Joshua Alm Hamilton, MD as Consulting Physician (Neurosurgery) Defoor, Elsie HERO, PA-C (Rheumatology)    Medications: Outpatient Medications Prior to Visit  Medication Sig   amLODipine  (NORVASC ) 10 MG tablet TAKE 1 TABLET BY MOUTH DAILY   aspirin  EC 81 MG tablet Take 1 tablet (81 mg total) by mouth daily. Swallow whole.   Azelastine  HCl 137 MCG/SPRAY SOLN PLACE 2 SPRAYS INTO BOTH NOSTRILS 2 (TWO) TIMES DAILY. USE IN EACH NOSTRIL AS DIRECTED   BIMZELX 160 MG/ML pen    butalbital -acetaminophen -caffeine  (FIORICET ) 50-325-40 MG tablet TAKE 1-2 TABLET BY MOUTH EVERY 6 (SIX) HOURS AS NEEDED FOR HEADACHE.   calcipotriene  (DOVONOX) 0.005 % cream Apply 1 application  topically 3 (three) times daily as needed (psoriasis).   Calcipotriene -Betameth Diprop (WYNZORA ) 0.005-0.064 % CREA Apply to aa's psoriasis BID PRN.   Cholecalciferol  (VITAMIN D3) 1000 units CAPS Take 1,000 Units by mouth daily.    cycloSPORINE (RESTASIS) 0.05 % ophthalmic emulsion Place 1 drop into both eyes 2 (two) times daily.   diclofenac Sodium (VOLTAREN) 1 % GEL Apply topically.   fexofenadine  (ALLEGRA ) 180 MG tablet TAKE 1 TABLET BY MOUTH EVERY DAY   fluticasone  (FLONASE ) 50 MCG/ACT nasal spray USE 2 SPRAYS IN BOTH NOSTRILS  DAILY AS NEEDED FOR ALLERGIES   halobetasol  (ULTRAVATE ) 0.05 % ointment Apply 1 application  topically daily as needed (psoriasis). Apply to aa's psoriasis BID PRN. Avoid applying to face, groin, and axilla. Use as directed. Long-term use can cause thinning of the skin.   LORazepam  (ATIVAN ) 1 MG tablet TAKE ONE-HALF TO ONE TABLET BY MOUTH TWICE DAILY AS NEEDED FOR ANXIETY   oxyCODONE -acetaminophen  (PERCOCET) 10-325 MG tablet Take 1 tablet by mouth 3 (three) times daily.   Povidone (IVIZIA DRY EYES OP) Apply 2 drops to eye 2 (two) times daily.   pramoxine-hydrocortisone  (ANALPRAM -HC) 1-1 % rectal cream Place 1 application rectally 2 (two) times daily.   rosuvastatin  (CRESTOR ) 10 MG tablet TAKE 1 TABLET BY MOUTH DAILY   tiZANidine  (ZANAFLEX ) 2 MG tablet Take 2 mg by mouth 3 (three) times daily.   triamcinolone  cream (KENALOG) 0.5 % Apply 1 application topically 2 (two) times daily. As needed   vitamin C  (ASCORBIC ACID )  500 MG tablet Take 500 mg by mouth every other day.   zolpidem  (AMBIEN ) 10 MG tablet Take 1 tablet (10 mg total) by mouth at bedtime as needed for sleep. TAKE ONE TABLET BY MOUTH ONE TIME DAILY AT BEDTIME   [DISCONTINUED] nitroGLYCERIN  (NITROSTAT ) 0.4 MG SL tablet Place 1 tablet (0.4 mg total) under the tongue every 5 (five) minutes as needed for chest pain. 1 tablet may be used every 5 minutes as needed, for up to 15 minutes. Do not take more than 3 tablets in 15 minutes. If you do not feel better or feel worse after 1 dose, call 9-1-1 at once.   [DISCONTINUED] vitamin B-12 (CYANOCOBALAMIN ) 1000 MCG tablet Take 1,000 mcg by mouth daily. (Patient not taking: Reported on 02/11/2024)   No facility-administered medications prior to visit.    Review of Systems  Constitutional:  Negative for appetite change, chills, fatigue and fever.  Respiratory:  Negative for chest tightness and shortness of breath.   Cardiovascular:  Negative for chest pain and palpitations.  Gastrointestinal:  Negative for abdominal pain, nausea and vomiting.  Neurological:  Negative for dizziness and weakness.      Objective    BP 125/73   Pulse 78   Resp 14   Ht 5' 3 (1.6 m)   Wt 125 lb 8 oz (56.9 kg)   SpO2 97%   BMI 22.23 kg/m    Physical Exam  General Appearance:    Well developed, well nourished female. Alert, cooperative, in no acute distress, appears stated age   Head:    Normocephalic, without obvious abnormality, atraumatic  Eyes:    PERRL, conjunctiva/corneas clear, EOM's intact, fundi    benign, both eyes  Ears:    Normal TM's and external ear canals, both ears  Nose:   Nares normal, septum midline, mucosa normal, no drainage    or sinus tenderness  Throat:   Lips, mucosa, and tongue normal; teeth and gums normal  Neck:   Supple, symmetrical, trachea midline, no adenopathy;    thyroid:  no  enlargement/tenderness/nodules; no carotid   bruit or JVD  Back:     Symmetric, no curvature, ROM normal, no CVA tenderness  Lungs:     Clear to auscultation bilaterally, respirations unlabored  Chest Wall:    No tenderness or deformity   Heart:    Normal heart rate. Normal rhythm. No murmurs, rubs, or gallops.   Breast Exam:    deferred  Abdomen:     Soft, non-tender, bowel sounds active all four quadrants,    no masses, no organomegaly  Pelvic:    deferred  Extremities:   All extremities are intact. No cyanosis or edema. Mild vardicosities  Pulses:   2+ and symmetric all extremities  Skin:   Skin color, texture, turgor normal, no rashes or lesions  Lymph nodes:   Cervical, supraclavicular, and axillary nodes normal  Neurologic:   CNII-XII intact, normal strength, sensation and reflexes    throughout       Assessment & Plan    Routine Health Maintenance and Physical Exam  Exercise Activities and Dietary recommendations  Goals      Increase water intake     Recommend increasing water intake to 6-8 glasses a day.          Immunization History  Administered Date(s) Administered   Fluad Quad(high Dose 65+) 03/09/2019   INFLUENZA, HIGH DOSE SEASONAL PF 02/26/2017, 05/11/2018   PPD Test 09/11/2015   Pneumococcal Conjugate-13 01/23/2015  Pneumococcal Polysaccharide-23 02/22/2016   Td 02/26/2017   Tdap 10/07/2006   Zoster, Live 07/06/2012    Health Maintenance  Topic Date Due   Zoster Vaccines- Shingrix (1 of 2) 04/10/1968   DEXA SCAN  06/19/2023   Influenza Vaccine  02/06/2024   COVID-19 Vaccine (1) 04/16/2024 (Originally 04/10/1956)   Medicare Annual Wellness (AWV)  03/31/2025   Mammogram  07/09/2025   DTaP/Tdap/Td (3 - Td or Tdap) 02/27/2027   Colonoscopy  01/24/2030   Pneumococcal Vaccine: 50+ Years  Completed   Hepatitis C Screening  Completed   HPV VACCINES  Aged Out   Meningococcal B Vaccine  Aged Out    Discussed health benefits of physical activity, and  encouraged her to engage in regular exercise appropriate for her age and condition.  2. Estrogen deficiency  - DG Bone Density; Future  3. Cerebrovascular disease Asymptomatic. Compliant with medication.  Continue aggressive risk factor modification.    4. Coronary artery disease of native artery of native heart with stable angina pectoris Asymptomatic. Compliant with medication.  Continue aggressive risk factor modification.  Continue routine follow up Dr. Mady  - CBC - Comprehensive metabolic panel with GFR - Lipid panel  5. Primary hypertension Well controlled.  Continue current medications.    6. Ankle edema, bilateral Minimal on exam, is worse at home in the evenings. Likely secondary to amlodipine .   7. Burning sensation of feet Previously on supplement b12 which she is no longer taking.   - VITAMIN D  25 Hydroxy (Vit-D Deficiency, Fractures) - Vitamin B12  8. Insomnia, unspecified type Zolpidem  a few days a week remains effective.   - zolpidem  (AMBIEN ) 10 MG tablet; Take 1 tablet (10 mg total) by mouth at bedtime as needed for sleep.  Dispense: 90 tablet; Refill: 1  9. Hyperglycemia  - Hemoglobin A1c  10. Osteopenia, unspecified location  - VITAMIN D  25 Hydroxy (Vit-D Deficiency, Fractures) BMD ordered as above.   12. Rheumatoid arthritis, involving unspecified site, unspecified whether rheumatoid factor present (HCC) Stable on current regiment managed by Dr. Dafoor - VITAMIN D  25 Hydroxy (Vit-D Deficiency, Fractures)   Return in about 1 year (around 03/31/2025) for Yearly Physical.        Nancyann Perry, MD  [facility] [phone] (phone) [phone] (fax)  [facility]

## 2024-03-31 NOTE — Patient Instructions (Signed)
 SABRA  Please review the attached list of medications and notify my office if there are any errors.   . Please bring all of your medications to every appointment so we can make sure that our medication list is the same as yours.

## 2024-03-31 NOTE — Progress Notes (Signed)
 Annual Wellness Visit     Patient: Meredith Russell, Female    DOB: 07-Jun-1951, 73 y.o.   MRN: [id number] Visit Date: 03/31/2024  Today's Provider: Nancyann Perry, MD    Subjective    Meredith Russell is a 73 y.o. female who presents today for her Annual Wellness Visit.   Medications: Outpatient Medications Prior to Visit  Medication Sig   amLODipine  (NORVASC ) 10 MG tablet TAKE 1 TABLET BY MOUTH DAILY   aspirin  EC 81 MG tablet Take 1 tablet (81 mg total) by mouth daily. Swallow whole.   Azelastine  HCl 137 MCG/SPRAY SOLN PLACE 2 SPRAYS INTO BOTH NOSTRILS 2 (TWO) TIMES DAILY. USE IN EACH NOSTRIL AS DIRECTED   BIMZELX 160 MG/ML pen    butalbital -acetaminophen -caffeine  (FIORICET ) 50-325-40 MG tablet TAKE 1-2 TABLET BY MOUTH EVERY 6 (SIX) HOURS AS NEEDED FOR HEADACHE.   calcipotriene  (DOVONOX) 0.005 % cream Apply 1 application  topically 3 (three) times daily as needed (psoriasis).   Calcipotriene -Betameth Diprop (WYNZORA ) 0.005-0.064 % CREA Apply to aa's psoriasis BID PRN.   Cholecalciferol  (VITAMIN D3) 1000 units CAPS Take 1,000 Units by mouth daily.    cycloSPORINE (RESTASIS) 0.05 % ophthalmic emulsion Place 1 drop into both eyes 2 (two) times daily.   diclofenac Sodium (VOLTAREN) 1 % GEL Apply topically.   fexofenadine  (ALLEGRA ) 180 MG tablet TAKE 1 TABLET BY MOUTH EVERY DAY   fluticasone  (FLONASE ) 50 MCG/ACT nasal spray USE 2 SPRAYS IN BOTH NOSTRILS  DAILY AS NEEDED FOR ALLERGIES   halobetasol  (ULTRAVATE ) 0.05 % ointment Apply 1 application  topically daily as needed (psoriasis). Apply to aa's psoriasis BID PRN. Avoid applying to face, groin, and axilla. Use as directed. Long-term use can cause thinning of the skin.   LORazepam  (ATIVAN ) 1 MG tablet TAKE ONE-HALF TO ONE TABLET BY MOUTH TWICE DAILY AS NEEDED FOR ANXIETY   oxyCODONE -acetaminophen  (PERCOCET) 10-325 MG tablet Take 1 tablet by mouth 3 (three) times daily.   Povidone (IVIZIA DRY EYES OP) Apply 2 drops to eye 2 (two) times  daily.   pramoxine-hydrocortisone  (ANALPRAM -HC) 1-1 % rectal cream Place 1 application rectally 2 (two) times daily.   rosuvastatin  (CRESTOR ) 10 MG tablet TAKE 1 TABLET BY MOUTH DAILY   tiZANidine  (ZANAFLEX ) 2 MG tablet Take 2 mg by mouth 3 (three) times daily.   triamcinolone cream (KENALOG) 0.5 % Apply 1 application topically 2 (two) times daily. As needed   vitamin C  (ASCORBIC ACID ) 500 MG tablet Take 500 mg by mouth every other day.   zolpidem  (AMBIEN ) 10 MG tablet Take 1 tablet (10 mg total) by mouth at bedtime as needed for sleep. TAKE ONE TABLET BY MOUTH ONE TIME DAILY AT BEDTIME   [DISCONTINUED] vitamin B-12 (CYANOCOBALAMIN ) 1000 MCG tablet Take 1,000 mcg by mouth daily. (Patient not taking: Reported on 02/11/2024)   No facility-administered medications prior to visit.    Allergies  Allergen Reactions   Adalimumab Itching, Rash and Dermatitis    Psoriasis   Morphine Other (See Comments)    Tachycardia  Other Reaction(s): other  morphine   Morphine And Codeine Other (See Comments)    Tachycardia   Pregabalin Other (See Comments)    Increased BP  Other Reaction(s): other  pregabalin   Milnacipran Rash and Dermatitis   Sulfa Antibiotics Nausea And Vomiting    Patient Care Team: Perry Nancyann BRAVO, MD as PCP - General (Family Medicine) End, Lonni, MD as PCP - Cardiology (Cardiology) [facility], [place] , MD (Inactive) as Consulting Physician (Dermatology)  Avanell Katz, MD as Referring Physician (Physical Medicine and Rehabilitation) [place], Teodoro K, MD as Consulting Physician (Gastroenterology) Joshua Alm Hamilton, MD as Consulting Physician (Neurosurgery) Defoor, Elsie HERO, PA-C (Rheumatology)      Objective      Most recent functional status assessment:    03/31/2024    9:18 AM  In your present state of health, do you have any difficulty performing the following activities:  Hearing? 1  Vision? 0  Difficulty concentrating or making decisions? 0  Dressing  or bathing? 0  Doing errands, shopping? 0   Most recent fall risk assessment:    03/31/2024    8:22 AM  Fall Risk   Falls in the past year? 0  Number falls in past yr: 0  Injury with Fall? 0  Risk for fall due to : No Fall Risks  Follow up Falls evaluation completed    Most recent depression screenings:    03/31/2024    8:22 AM 12/12/2023    8:14 AM  PHQ 2/9 Scores  PHQ - 2 Score 0 0  PHQ- 9 Score 0 4   Most recent cognitive screening:    03/31/2024    9:17 AM  6CIT Screen  What Year? 0 points  What month? 0 points  What time? 0 points  Count back from 20 0 points  Months in reverse 0 points  Repeat phrase 2 points  Total Score 2 points   Most recent Audit-C alcohol use screening    03/29/2024    1:34 PM  Alcohol Use Disorder Test (AUDIT)  1. How often do you have a drink containing alcohol? 0   A score of 3 or more in women, and 4 or more in men indicates increased risk for alcohol abuse, EXCEPT if all of the points are from question 1     Assessment & Plan     Annual wellness visit done today including the all of the following: Reviewed patient's Family Medical History Reviewed and updated list of patient's medical providers Assessment of cognitive impairment was done Assessed patient's functional ability Established a written schedule for health screening services Health Risk Assessent Completed and Reviewed  Exercise Activities and Dietary recommendations  Goals      Increase water intake     Recommend increasing water intake to 6-8 glasses a day.          Immunization History  Administered Date(s) Administered   Fluad Quad(high Dose 65+) 03/09/2019   INFLUENZA, HIGH DOSE SEASONAL PF 02/26/2017, 05/11/2018   PPD Test 09/11/2015   Pneumococcal Conjugate-13 01/23/2015   Pneumococcal Polysaccharide-23 02/22/2016   Td 02/26/2017   Tdap 10/07/2006   Zoster, Live 07/06/2012    Health Maintenance  Topic Date Due   Zoster Vaccines- Shingrix (1  of 2) 04/10/1968   DEXA SCAN  06/19/2023   Influenza Vaccine  02/06/2024   COVID-19 Vaccine (1) 04/16/2024 (Originally 04/10/1956)   Medicare Annual Wellness (AWV)  03/31/2025   Mammogram  07/09/2025   DTaP/Tdap/Td (3 - Td or Tdap) 02/27/2027   Colonoscopy  01/24/2030   Pneumococcal Vaccine: 50+ Years  Completed   Hepatitis C Screening  Completed   HPV VACCINES  Aged Out   Meningococcal B Vaccine  Aged Out     Discussed health benefits of physical activity, and encouraged her to engage in regular exercise appropriate for her age and condition.             Nancyann Perry, MD  [facility] [phone] (phone) [phone] (fax)  [facility]

## 2024-04-01 ENCOUNTER — Ambulatory Visit: Payer: Self-pay | Admitting: Family Medicine

## 2024-04-01 LAB — COMPREHENSIVE METABOLIC PANEL WITH GFR
ALT: 17 IU/L (ref 0–32)
AST: 26 IU/L (ref 0–40)
Albumin: 4.3 g/dL (ref 3.8–4.8)
Alkaline Phosphatase: 89 IU/L (ref 49–135)
BUN/Creatinine Ratio: 14 (ref 12–28)
BUN: 12 mg/dL (ref 8–27)
Bilirubin Total: 0.6 mg/dL (ref 0.0–1.2)
CO2: 24 mmol/L (ref 20–29)
Calcium: 9.7 mg/dL (ref 8.7–10.3)
Chloride: 105 mmol/L (ref 96–106)
Creatinine, Ser: 0.88 mg/dL (ref 0.57–1.00)
Globulin, Total: 2.6 g/dL (ref 1.5–4.5)
Glucose: 102 mg/dL — ABNORMAL HIGH (ref 70–99)
Potassium: 4.3 mmol/L (ref 3.5–5.2)
Sodium: 144 mmol/L (ref 134–144)
Total Protein: 6.9 g/dL (ref 6.0–8.5)
eGFR: 69 mL/min/1.73 (ref >59)

## 2024-04-01 LAB — CBC
Hematocrit: 38.7 % (ref 34.0–46.6)
Hemoglobin: 12.5 g/dL (ref 11.1–15.9)
MCH: 30.3 pg (ref 26.6–33.0)
MCHC: 32.3 g/dL (ref 31.5–35.7)
MCV: 94 fL (ref 79–97)
Platelets: 318 x10E3/uL (ref 150–450)
RBC: 4.12 x10E6/uL (ref 3.77–5.28)
RDW: 12 % (ref 11.7–15.4)
WBC: 5.5 x10E3/uL (ref 3.4–10.8)

## 2024-04-01 LAB — LIPID PANEL
Chol/HDL Ratio: 1.9 ratio (ref 0.0–4.4)
Cholesterol, Total: 141 mg/dL (ref 100–199)
HDL: 74 mg/dL (ref >39)
LDL Chol Calc (NIH): 51 mg/dL (ref 0–99)
Triglycerides: 84 mg/dL (ref 0–149)
VLDL Cholesterol Cal: 16 mg/dL (ref 5–40)

## 2024-04-01 LAB — HEMOGLOBIN A1C
Est. average glucose Bld gHb Est-mCnc: 126 mg/dL
Hgb A1c MFr Bld: 6 % — ABNORMAL HIGH (ref 4.8–5.6)

## 2024-04-01 LAB — TSH: TSH: 3.07 u[IU]/mL (ref 0.450–4.500)

## 2024-04-01 LAB — VITAMIN B12: Vitamin B-12: 379 pg/mL (ref 232–1245)

## 2024-04-01 LAB — VITAMIN D 25 HYDROXY (VIT D DEFICIENCY, FRACTURES): Vit D, 25-Hydroxy: 39.9 ng/mL (ref 30.0–100.0)

## 2024-04-19 ENCOUNTER — Encounter: Payer: Medicare Other | Admitting: Family Medicine

## 2024-04-28 ENCOUNTER — Other Ambulatory Visit: Payer: Self-pay | Admitting: Family Medicine

## 2024-04-28 DIAGNOSIS — G4700 Insomnia, unspecified: Secondary | ICD-10-CM

## 2024-05-31 ENCOUNTER — Telehealth: Payer: Self-pay | Admitting: Family Medicine

## 2024-05-31 NOTE — Telephone Encounter (Signed)
 [facility] faxed refill request for the following medications:  Azelastine  HCl 137 MCG/SPRAY SOLN     Please advise.

## 2024-06-02 ENCOUNTER — Other Ambulatory Visit: Payer: Self-pay

## 2024-06-02 DIAGNOSIS — J301 Allergic rhinitis due to pollen: Secondary | ICD-10-CM

## 2024-06-02 MED ORDER — AZELASTINE HCL 137 MCG/SPRAY NA SOLN: NASAL | 1 refills | Status: DC

## 2024-06-02 NOTE — Telephone Encounter (Signed)
 Converted into a refill request

## 2024-06-07 ENCOUNTER — Telehealth: Payer: Self-pay | Admitting: Family Medicine

## 2024-06-07 DIAGNOSIS — E2839 Other primary ovarian failure: Secondary | ICD-10-CM

## 2024-06-07 NOTE — Telephone Encounter (Signed)
 Copied from CRM #[id number]. Topic: Referral - Request for Referral >> Jun 07, 2024 12:48 PM Zebedee SAUNDERS wrote: Did the patient discuss referral with their provider in the last year? Yes (If No - schedule appointment) (If Yes - send message)  Appointment offered? Yes  Type of order/referral and detailed reason for visit: Bone Density  Preference of office, provider, location: Pt's area  If referral order, have you been seen by this specialty before? Yes (If Yes, this issue or another issue? When? Where?Does not recall   Can we respond through MyChart? Yes

## 2024-06-08 NOTE — Telephone Encounter (Signed)
 Order placed

## 2024-06-08 NOTE — Addendum Note (Signed)
 Addended by: GASPER NANCYANN BRAVO on: 06/08/2024 03:19 PM   Modules accepted: Orders

## 2024-06-17 ENCOUNTER — Other Ambulatory Visit: Payer: Self-pay

## 2024-06-17 ENCOUNTER — Telehealth: Payer: Self-pay | Admitting: Family Medicine

## 2024-06-17 DIAGNOSIS — J301 Allergic rhinitis due to pollen: Secondary | ICD-10-CM

## 2024-06-17 MED ORDER — AZELASTINE HCL 137 MCG/SPRAY NA SOLN: NASAL | 1 refills | Status: AC

## 2024-06-17 NOTE — Telephone Encounter (Signed)
 [facility] faxed refill request for the following medications:  Azelastine  HCl 137 MCG/SPRAY SOLN     Please advise.

## 2024-06-17 NOTE — Telephone Encounter (Signed)
 Converted into a refill request. Prescription sent in.

## 2024-07-14 ENCOUNTER — Other Ambulatory Visit: Payer: Self-pay | Admitting: Family Medicine

## 2024-07-14 DIAGNOSIS — Z1231 Encounter for screening mammogram for malignant neoplasm of breast: Secondary | ICD-10-CM

## 2024-07-26 ENCOUNTER — Ambulatory Visit: Admitting: Dermatology

## 2024-08-05 ENCOUNTER — Ambulatory Visit
Admission: RE | Admit: 2024-08-05 | Discharge: 2024-08-05 | Disposition: A | Discharge status: Home / Self Care | Source: Ambulatory Visit | Attending: Family Medicine | Admitting: Family Medicine

## 2024-08-05 ENCOUNTER — Ambulatory Visit: Payer: Self-pay | Admitting: Family Medicine

## 2024-08-05 DIAGNOSIS — M810 Age-related osteoporosis without current pathological fracture: Secondary | ICD-10-CM

## 2024-08-05 DIAGNOSIS — Z1231 Encounter for screening mammogram for malignant neoplasm of breast: Secondary | ICD-10-CM | POA: Insufficient documentation

## 2024-08-05 DIAGNOSIS — E2839 Other primary ovarian failure: Secondary | ICD-10-CM | POA: Diagnosis present

## 2024-08-05 MED ORDER — ALENDRONATE SODIUM 70 MG PO TABS: 70.0000 mg | ORAL_TABLET | ORAL | 11 refills | Status: AC

## 2024-08-09 ENCOUNTER — Ambulatory Visit

## 2024-08-23 ENCOUNTER — Ambulatory Visit

## 2024-09-27 ENCOUNTER — Ambulatory Visit: Admitting: Student

## 2024-10-14 ENCOUNTER — Ambulatory Visit: Payer: Medicare Other | Admitting: Dermatology

## 2024-10-18 ENCOUNTER — Ambulatory Visit: Admitting: Dermatology

## 2024-10-27 ENCOUNTER — Ambulatory Visit: Admitting: Dermatology

## 2025-04-01 ENCOUNTER — Encounter: Admitting: Family Medicine
